# Patient Record
Sex: Male | Born: 1950
Health system: Southern US, Community
[De-identification: ages and names within clinical notes are randomized; demographics above are authoritative.]

## PROBLEM LIST (undated history)

## (undated) DIAGNOSIS — N189 Chronic kidney disease, unspecified: Secondary | ICD-10-CM

## (undated) DIAGNOSIS — M069 Rheumatoid arthritis, unspecified: Secondary | ICD-10-CM

## (undated) DIAGNOSIS — G4730 Sleep apnea, unspecified: Secondary | ICD-10-CM

## (undated) DIAGNOSIS — J45909 Unspecified asthma, uncomplicated: Secondary | ICD-10-CM

## (undated) DIAGNOSIS — E669 Obesity, unspecified: Secondary | ICD-10-CM

## (undated) DIAGNOSIS — E039 Hypothyroidism, unspecified: Secondary | ICD-10-CM

## (undated) DIAGNOSIS — I10 Essential (primary) hypertension: Secondary | ICD-10-CM

## (undated) DIAGNOSIS — D352 Benign neoplasm of pituitary gland: Secondary | ICD-10-CM

## (undated) DIAGNOSIS — E119 Type 2 diabetes mellitus without complications: Secondary | ICD-10-CM

## (undated) DIAGNOSIS — E785 Hyperlipidemia, unspecified: Secondary | ICD-10-CM

## (undated) HISTORY — PX: TUMOR REMOVAL: SHX12

## (undated) HISTORY — DX: Essential (primary) hypertension: I10

## (undated) HISTORY — DX: Hyperlipidemia, unspecified: E78.5

## (undated) HISTORY — DX: Sleep apnea, unspecified: G47.30

## (undated) HISTORY — DX: Type 2 diabetes mellitus without complications: E11.9

## (undated) HISTORY — PX: HAND SURGERY: SHX662

## (undated) HISTORY — PX: THYROIDECTOMY: SHX17

## (undated) HISTORY — DX: Unspecified asthma, uncomplicated: J45.909

## (undated) HISTORY — PX: TONSILLECTOMY: SUR1361

## (undated) HISTORY — DX: Chronic kidney disease, unspecified: N18.9

## (undated) HISTORY — DX: Obesity, unspecified: E66.9

## (undated) HISTORY — DX: Benign neoplasm of pituitary gland: D35.2

## (undated) HISTORY — DX: Rheumatoid arthritis, unspecified: M06.9

---

## 1996-12-03 HISTORY — PX: OTHER SURGICAL HISTORY: SHX169

## 1998-08-19 ENCOUNTER — Ambulatory Visit (HOSPITAL_COMMUNITY): Admission: RE | Admit: 1998-08-19 | Discharge: 1998-08-19 | Payer: Self-pay | Admitting: *Deleted

## 1999-06-29 ENCOUNTER — Encounter (INDEPENDENT_AMBULATORY_CARE_PROVIDER_SITE_OTHER): Payer: Self-pay | Admitting: Specialist

## 1999-06-29 ENCOUNTER — Ambulatory Visit (HOSPITAL_COMMUNITY): Admission: RE | Admit: 1999-06-29 | Discharge: 1999-06-29 | Payer: Self-pay

## 1999-09-27 ENCOUNTER — Ambulatory Visit (HOSPITAL_COMMUNITY): Admission: RE | Admit: 1999-09-27 | Discharge: 1999-09-27 | Payer: Self-pay

## 1999-10-03 ENCOUNTER — Emergency Department (HOSPITAL_COMMUNITY): Admission: EM | Admit: 1999-10-03 | Discharge: 1999-10-03 | Payer: Self-pay | Admitting: *Deleted

## 1999-11-20 ENCOUNTER — Encounter (INDEPENDENT_AMBULATORY_CARE_PROVIDER_SITE_OTHER): Payer: Self-pay | Admitting: *Deleted

## 1999-11-20 ENCOUNTER — Ambulatory Visit (HOSPITAL_COMMUNITY): Admission: RE | Admit: 1999-11-20 | Discharge: 1999-11-21 | Payer: Self-pay

## 2000-01-22 ENCOUNTER — Ambulatory Visit (HOSPITAL_COMMUNITY): Admission: RE | Admit: 2000-01-22 | Discharge: 2000-01-22 | Payer: Self-pay | Admitting: Endocrinology

## 2000-01-26 ENCOUNTER — Encounter: Payer: Self-pay | Admitting: Endocrinology

## 2001-01-09 ENCOUNTER — Ambulatory Visit (HOSPITAL_COMMUNITY): Admission: RE | Admit: 2001-01-09 | Discharge: 2001-01-09 | Payer: Self-pay | Admitting: Endocrinology

## 2001-01-09 ENCOUNTER — Encounter: Payer: Self-pay | Admitting: Endocrinology

## 2001-01-27 ENCOUNTER — Ambulatory Visit (HOSPITAL_COMMUNITY): Admission: RE | Admit: 2001-01-27 | Discharge: 2001-01-27 | Payer: Self-pay | Admitting: Endocrinology

## 2001-01-28 ENCOUNTER — Encounter: Payer: Self-pay | Admitting: Endocrinology

## 2001-03-10 ENCOUNTER — Ambulatory Visit (HOSPITAL_COMMUNITY): Admission: RE | Admit: 2001-03-10 | Discharge: 2001-03-10 | Payer: Self-pay | Admitting: Endocrinology

## 2001-03-10 ENCOUNTER — Encounter: Payer: Self-pay | Admitting: Endocrinology

## 2001-12-22 ENCOUNTER — Encounter: Payer: Self-pay | Admitting: Endocrinology

## 2001-12-22 ENCOUNTER — Ambulatory Visit (HOSPITAL_COMMUNITY): Admission: RE | Admit: 2001-12-22 | Discharge: 2001-12-22 | Payer: Self-pay | Admitting: Endocrinology

## 2001-12-26 ENCOUNTER — Ambulatory Visit (HOSPITAL_COMMUNITY): Admission: RE | Admit: 2001-12-26 | Discharge: 2001-12-26 | Payer: Self-pay | Admitting: Endocrinology

## 2003-02-15 ENCOUNTER — Ambulatory Visit (HOSPITAL_COMMUNITY): Admission: RE | Admit: 2003-02-15 | Discharge: 2003-02-15 | Payer: Self-pay | Admitting: Endocrinology

## 2003-02-15 ENCOUNTER — Encounter: Payer: Self-pay | Admitting: Endocrinology

## 2003-03-11 ENCOUNTER — Emergency Department (HOSPITAL_COMMUNITY): Admission: EM | Admit: 2003-03-11 | Discharge: 2003-03-11 | Payer: Self-pay | Admitting: Emergency Medicine

## 2003-03-11 ENCOUNTER — Encounter: Payer: Self-pay | Admitting: Emergency Medicine

## 2003-12-31 ENCOUNTER — Ambulatory Visit (HOSPITAL_COMMUNITY): Admission: RE | Admit: 2003-12-31 | Discharge: 2003-12-31 | Payer: Self-pay | Admitting: *Deleted

## 2004-08-21 ENCOUNTER — Encounter (HOSPITAL_COMMUNITY): Admission: RE | Admit: 2004-08-21 | Discharge: 2004-11-19 | Payer: Self-pay | Admitting: Family Medicine

## 2006-02-12 ENCOUNTER — Emergency Department (HOSPITAL_COMMUNITY): Admission: EM | Admit: 2006-02-12 | Discharge: 2006-02-12 | Payer: Self-pay | Admitting: *Deleted

## 2006-02-18 ENCOUNTER — Ambulatory Visit: Payer: Self-pay

## 2006-03-05 ENCOUNTER — Ambulatory Visit: Payer: Self-pay | Admitting: Internal Medicine

## 2006-03-11 ENCOUNTER — Ambulatory Visit: Payer: Self-pay | Admitting: Internal Medicine

## 2006-03-17 ENCOUNTER — Ambulatory Visit (HOSPITAL_BASED_OUTPATIENT_CLINIC_OR_DEPARTMENT_OTHER): Admission: RE | Admit: 2006-03-17 | Discharge: 2006-03-17 | Payer: Self-pay | Admitting: Internal Medicine

## 2006-03-26 ENCOUNTER — Encounter: Payer: Self-pay | Admitting: Cardiology

## 2006-03-26 ENCOUNTER — Ambulatory Visit: Payer: Self-pay

## 2006-03-28 ENCOUNTER — Ambulatory Visit: Payer: Self-pay | Admitting: Pulmonary Disease

## 2006-04-16 ENCOUNTER — Ambulatory Visit: Payer: Self-pay | Admitting: Internal Medicine

## 2006-05-13 ENCOUNTER — Ambulatory Visit: Payer: Self-pay | Admitting: Pulmonary Disease

## 2006-06-24 ENCOUNTER — Ambulatory Visit: Payer: Self-pay | Admitting: Pulmonary Disease

## 2007-08-20 ENCOUNTER — Ambulatory Visit: Payer: Self-pay | Admitting: Internal Medicine

## 2007-09-10 ENCOUNTER — Encounter: Admission: RE | Admit: 2007-09-10 | Discharge: 2007-09-10 | Payer: Self-pay

## 2007-09-23 ENCOUNTER — Ambulatory Visit: Payer: Self-pay | Admitting: Internal Medicine

## 2007-10-21 ENCOUNTER — Ambulatory Visit: Payer: Self-pay | Admitting: Pulmonary Disease

## 2008-04-25 ENCOUNTER — Ambulatory Visit: Payer: Self-pay | Admitting: Cardiology

## 2008-04-25 ENCOUNTER — Inpatient Hospital Stay (HOSPITAL_COMMUNITY): Admission: EM | Admit: 2008-04-25 | Discharge: 2008-04-27 | Payer: Self-pay | Admitting: Emergency Medicine

## 2008-04-27 ENCOUNTER — Encounter (INDEPENDENT_AMBULATORY_CARE_PROVIDER_SITE_OTHER): Payer: Self-pay | Admitting: Neurology

## 2008-11-24 ENCOUNTER — Encounter: Payer: Self-pay | Admitting: Internal Medicine

## 2008-11-24 DIAGNOSIS — E663 Overweight: Secondary | ICD-10-CM | POA: Insufficient documentation

## 2008-11-24 DIAGNOSIS — R002 Palpitations: Secondary | ICD-10-CM | POA: Insufficient documentation

## 2008-12-21 ENCOUNTER — Encounter: Admission: RE | Admit: 2008-12-21 | Discharge: 2008-12-21 | Payer: Self-pay | Admitting: Internal Medicine

## 2009-12-03 HISTORY — PX: OTHER SURGICAL HISTORY: SHX169

## 2009-12-15 ENCOUNTER — Encounter: Admission: RE | Admit: 2009-12-15 | Discharge: 2009-12-15 | Payer: Self-pay | Admitting: Endocrinology

## 2010-01-02 ENCOUNTER — Encounter: Payer: Self-pay | Admitting: Internal Medicine

## 2010-06-02 ENCOUNTER — Encounter: Admission: RE | Admit: 2010-06-02 | Discharge: 2010-06-02 | Payer: Self-pay | Admitting: Internal Medicine

## 2010-07-17 ENCOUNTER — Encounter: Payer: Self-pay | Admitting: Internal Medicine

## 2010-12-24 ENCOUNTER — Encounter: Payer: Self-pay | Admitting: Endocrinology

## 2010-12-24 ENCOUNTER — Encounter: Payer: Self-pay | Admitting: Family Medicine

## 2011-01-04 NOTE — Letter (Signed)
Summary: [facility] Medical Neurosurgery Office Note  [facility] Medical Neurosurgery Office Note   Imported By: Sallee Provencal 03/01/2010 16:23:54  _____________________________________________________________________  External Attachment:    Type:   Image     Comment:   External Document

## 2011-01-04 NOTE — Letter (Signed)
Summary: [facility] Medical Office Visit Note   [facility] Medical Office Visit Note   Imported By: Sallee Provencal 09/15/2010 12:41:57  _____________________________________________________________________  External Attachment:    Type:   Image     Comment:   External Document

## 2011-04-17 NOTE — H&P (Signed)
[name], [name]                ACCOUNT NO.:  [id number]   MEDICAL RECORD NO.:  [id number]          PATIENT TYPE:  INP   LOCATION:  3729                         FACILITY:  [facility]   PHYSICIAN:  Jana Hakim, M.D. DATE OF BIRTH:  [birth year]   DATE OF ADMISSION:  04/25/2008  DATE OF DISCHARGE:                              HISTORY & PHYSICAL   DATE OF ADMISSION:  Apr 25, 2008   CHIEF COMPLAINT:  Left arm numbness.   HISTORY OF PRESENT ILLNESS:  This is a 60 year old male presenting to  the emergency department with complaints of increased numbness in the  left arm that started about 9 a.m. and lasted 1-1/2 hours at first.  He  reports this pain or discomfort returned again 1 hour later.  He also  reports having numbness in both sides of his face and had a left-sided  headache.  The patient denies having any chest pain, shortness of  breath, nausea, vomiting, diaphoresis associated with the discomfort.  He denies having any syncope or seizure activity.  The patient reports  trying to shake his arm because he felt this might have been due to  circulation.  Of note, the patient also reports having a previous  history of a pinched nerve in his neck.   PAST MEDICAL HISTORY:  Significant for rheumatoid arthritis, asthma,  hypertension, hypothyroidism secondary to a history of thyroid cancer  and status post thyroidectomy, obstructive sleep apnea and obesity.   PAST SURGICAL HISTORY:  Significant for a right knee tendon repair, a  thyroidectomy, and bilateral carpal tunnel release surgeries, as well as  a tonsillectomy.   MEDICATIONS:  Include:  Benicar/HCT 40/25, 1 p.o. daily.  Humira 40 mg.  ProAir inhaler q.i.d. p.r.n.  Methotrexate 2.5 mg. 4 tablets p.o. daily on Mondays.  Aspirin 81 mg one p.o. daily.  Synthroid 200 mcg, 1 p.o. daily.   ALLERGIES:  NO KNOWN DRUG ALLERGIES.   SOCIAL HISTORY:  The patient is a smoker.  He smokes 1-1/2 packs of  cigarettes per day and has  done so since age 6, 63 years.  He also  drinks alcohol and reports drinking 2 mixed drinks a week.   FAMILY HISTORY:  Positive for coronary artery disease and prostate  cancer in his father and positive strong family history, both sides of  hypertension and no history of diabetes that he knows of.   PHYSICAL EXAMINATION:  This is a pleasant 60 year old in no discomfort  or acute distress currently.  VITAL SIGNS:  Temperature 97.9, blood pressure 151/85, heart rate 76,  respirations 16 and O2 saturations 97%.  HEENT:  Normocephalic, atraumatic.  Pupils equally round, reactive to  light.  Extraocular muscles are intact.  Funduscopic benign. Oropharynx  is clear.  NECK:  Supple, full range of motion.  No thyromegaly, adenopathy,  jugular venous distention.  CARDIOVASCULAR:  Regular rate and rhythm.  No murmurs, gallops or rubs.  LUNGS:  Clear to auscultation bilaterally.  ABDOMEN:  Positive bowel sounds, soft, nontender, nondistended.  EXTREMITIES:  Without cyanosis, clubbing or edema.  NEUROLOGIC:  There are no focal deficits.  The patient has full range of  motion of all of his extremities.  His sensation is intact.  His gait is  also steady.  His reflexes are also intact.   LABORATORY STUDIES:  White blood cell count 9.7, hemoglobin 15.4,  hematocrit 44.4, platelets 298, neutrophils 58%, lymphocytes 26%.  Sodium 139, potassium 4.8, chloride 102, bicarb 27, BUN 14, creatinine  1.04, glucose 92.  Pro time 12.8, INR 0.9, PTT 32.  CT scan of the head  negative for any acute findings.   ASSESSMENT:  60 year old male being admitted with:   1. Left arm numbness.  2. Transient ischemic attack versus cerebrovascular accident.  3. Hypertension.  4. Rheumatoid arthritis.  5. Obstructive sleep apnea  6. Hypothyroid history.   PLAN:  The patient will be admitted to telemetry for cardiac monitoring.  Cardiac enzymes will be performed.  An MRI/MRA study has been attempted  before  admission.  However, due to the patient's size, the study was  unable to be completed at this facility.  An MRI/MRA will be ordered to  be performed at an accommodating facility.  A carotid ultrasound has  also been ordered as well.  Neurologic checks have been ordered and the  patient will continue on his regular medications.  The patient does not  have deficits at this time.  His swallowing, speaking and his motor and  sensory functions are all intact.  DVT and GI prophylaxis have also been  ordered and the patient's thyroid hormone will also be checked.      Jana Hakim, M.D.  Electronically Signed     HJ/MEDQ  D:  04/26/2008  T:  04/26/2008  Job:  [id number]   cc:   Arlyss Repress, MD

## 2011-04-17 NOTE — Discharge Summary (Signed)
[name], [name]                ACCOUNT NO.:  [id number]   MEDICAL RECORD NO.:  [id number]          PATIENT TYPE:  INP   LOCATION:  [id number]                         FACILITY:  [facility]   PHYSICIAN:  Vernell Leep, MD     DATE OF BIRTH:  1951/11/10   DATE OF ADMISSION:  04/25/2008  DATE OF DISCHARGE:  04/27/2008                               DISCHARGE SUMMARY   FAMILY MEDICAL DOCTOR:  Arlyss Repress, M.D.   CARDIOLOGIST:  Shaune Pascal. Bensimhon, M.D.   DISCHARGE DIAGNOSES:  1. Transient ischemic attack versus cerebrovascular accident.  2. Tobacco abuse.  3. Morbid obesity.  4. Hypertension.  5. Hypothyroidism status post thyroidectomy for thyroid cancer.  6. Rheumatoid arthritis.  7. Obstructive sleep apnea, on continuous positive airway pressure      q.h.s.   DISCHARGE MEDICATIONS:  1. Benicar/hydrochlorothiazide, 40/25, 1 p.o. daily.  2. Synthroid 225 mcg p.o. daily.  3. Methotrexate 10 mg p.o. weekly.  4. Humira 40 mg q. 2 weeks.  5. ProAir inhaler 1 puff 4 times daily p.r.n.  6. Plavix 75 mg p.o. daily.   MEDICATIONS DISCONTINUED:  Aspirin.   PERTINENT LABORATORIES:  1. CT angiogram of the neck.  Impression:      a.     Bilateral carotid bifurcation in left vertebral artery       origin, atherosclerosis with no hemodynamically significant       stenosis.      b.     No acute findings in the neck.  Query prior thyroidectomy.  2. CT angiogram of the head.  Impression:      a.     No intracranial arterial occlusion or aneurysm identified.      b.     Bilateral cavernous carotid atherosclerosis, left greater       than right.  There is 50% to 55% stenosis of the left cavernous       ICA with respect to the distal vessel.      c.     Incidental anatomic variants, including dominant left       vertebral artery, fetal origin of the right PCA and median artery       of the corpus callosum.      d.     No acute intracranial abnormality.  Nonspecific frontal lobe       subcortical  white matter hypodensity is most commonly due to       chronic small vessel ischemic disease.      e.     Ethmoid and left frontal sinus inflammatory changes.  3. X-ray of the orbits for foreign body.  Impression:  No foreign body      identified.  4. CT of the head without contrast.  Impression:      a.     No acute intracranial abnormality.      b.     Carotid atherosclerosis.      c.     Mild chronic microvascular ischemic changes.  5. A 2-D echocardiogram.  Report is pending.   TSH 0.068.  Basic metabolic panel unremarkable  with BUN 13, creatinine  1.01.  Glucose 127.  CBC is with hemoglobin 15.2, hematocrit 43.4, white  blood cell 8.3, platelets 242.  Cardiac enzymes negative x1.  INR 0.9.   CONSULTATIONS:  Neurology, [name], M.D.   [place]:  1. Please refer to the history and physical note for initial admission      details.  In summary, [name] is a pleasant 60 year old male      patient with history of rheumatoid arthritis, asthma, hypertension,      hypothyroidism, status post thyroidectomy for thyroid cancer,      obstructive sleep apnea, on nightly CPAP, who presented with a      history of numbness of the lower half of his face on both sides and      left upper extremity.  Initially the symptoms came on and resolved      after an hour and a half, but subsequently during the second      episode the symptoms persisted.  He denied any history of headache,      visual symptoms, change in his speech, drooling or twisting of his      mouth or limb weakness.  He did not have any loss of consciousness.      He was evaluated in the emergency room and was admitted to rule out      TIA versus CVA.  He was beyond the window for any t-PA      intervention.  He was admitted to telemetry bed with no significant      arrhythmia.  He has had episodes of sinus bradycardia in high 30s      to 50s but these were asymptomatic.  His cardiac enzymes  were      negative.  He had imaging studies as indicated above.      Unfortunately, he could not do a closed MRI at the hospital because      he would not fit in the machine.  Dr. Jannifer Franklin has reviewed him today      and I have discussed the patient's case with Dr. Jannifer Franklin, who      indicates that based on the CT angiogram of the head and the neck      there is no lesion that would require surgery or intervention.      From his standpoint, the patient could be discharged home on      Plavix.  He is to get an open MRI at [facility] as an outpatient      to rule out a CVA and then follow up with Dr. Jannifer Franklin in the next      week or two.  I have called Dr. Maudie Mercury, and Dr. Julianne Rice staff will make      arrangements for the open MRI in the next day or two and will      follow up.  2. Tobacco use.  The patient is a heavy smoker and has been counseled      regarding cessation.  He indicates that he does not feel that he      wants to quit, but he will have to.  He declined the offer for a      nicotine patch.  3. Morbid obesity.  4. Hypertension.  Continue with home medications.  5. Hypothyroidism status post thyroidectomy.  Continue thyroid      supplements.  6. Rheumatoid arthritis.  Continue home medications.  7. Obstructive sleep  apnea, which is stable on nightly CPAP.   The patient will be discharged home pending echo results.      Vernell Leep, MD  Electronically Signed     AH/MEDQ  D:  04/27/2008  T:  04/27/2008  Job:  [id number]   cc:   Arlyss Repress, MD  C. Floyde Parkins, M.D.  Shaune Pascal. Bensimhon, MD

## 2011-04-17 NOTE — Consult Note (Signed)
NAME:  [name]                ACCOUNT NO.:  [id number]   MEDICAL RECORD NO.:  [id number]          PATIENT TYPE:  INP   LOCATION:  [id number]                         FACILITY:  [facility]   PHYSICIAN:  Jill Alexanders, M.D.  DATE OF BIRTH:  February 17, 1951   DATE OF CONSULTATION:  04/26/2008  DATE OF DISCHARGE:                                 CONSULTATION   HISTORY OF PRESENT ILLNESS:  [name] is a 60 year old right-handed  white male, born 03/06/1951, with a history of obesity, hypertension,  obstructive sleep apnea, and rheumatoid arthritis.  The patient comes in  to [facility] for evaluation of transient episodes of left  upper extremity numbness that began around 9 a.m. on Apr 25, 2008.  The  patient had transient episode initially lasting only a few minutes.  Initially, he did not think anything of the numbness, but it recurred  later, again lasting a few minutes.  Several hours later, the patient  had another recurrence of the left upper extremity numbness, this time  occluding the left face and then going to the right face as well.  The  patient believed there has been some clumsiness of the left arm that was  transient in nature.  The patient, however, still feels some numb  tingling sensations on the face on both sides, but the left arm now  feels normal.  No weakness or clumsiness noted.  The patient never  recalled having trouble with the legs or with walking balance issues.  The patient did note slight left-sided headache yesterday and noted no  problems of speech or vision changes.  The patient has come into the  hospital for evaluation and a CT scan of the head by my review appears  to be unremarkable.  The patient was set up for an MRI scan of the brain  which could not be done due to the patient's size.  Neurology was asked  to see this patient for further evaluation.  The patient was on aspirin  prior to this admission.   Past medical history is significant  for;  1. History of episode of bilateral face left arm numbness.  2. Obesity.  3. Hypertension.  4. Thyroid cancer status post thyroidectomy and subsequent      hypothyroidism.  5. Arthroscopic knee surgery on the right.  6. Rheumatoid arthritis.  7. Bilateral carpal tunnel syndrome with surgery bilaterally.  8. Obstructive sleep apnea on CPAP.  9. Tonsillectomy.   The patient has no known allergies.   Smokes a pack and a half of cigarettes daily.  Drinks 2-3 alcoholic  beverages a week.   SOCIAL HISTORY:  The patient [place] in the [place] area, is retired  from [employer], has two children, both daughters, one daughter has  thyroid problems.   FAMILY MEDICAL HISTORY:  His mother is alive with hypertension.  Father  died with emphysema.  The patient has one brother and two sisters who  live in [place] and as far as he knows are alive and well.  One brother  died at a young age with some  sort of the stomach cancer.   Current medications at this time include;  1. Lovenox 40 mg daily.  2. Hydrochlorothiazide 25 mg daily.  3. Avapro 300 mg daily.  4. Synthroid 0.2 mg daily.  5. Methotrexate 10 mg once a week.  6. Protonix 40 mg daily.  7. Tylenol if needed.  8. Ventolin inhaler two puffs four times a day if needed.  9. Dilaudid if needed.  10.Ambien 5 mg if needed at night.   REVIEW OF SYSTEMS:  Notable for no recent fevers or chills.  The patient  noted some left-sided headache, does note some neck pain at times.  Has  history of shortness of breath, asthma, occasional palpitations of the  heart.  Denies any abdominal pain, troubles controlling bowels or  bladder, but has had some problems with diarrhea over the last week.  The patient denied any blackout spells, dizziness, or seizure spells.   PHYSICAL EXAMINATION:  VITALS:  Blood pressure is 156/89, heart rate 58,  respiratory rate 18, temperature afebrile.  GENERAL:  The patient is a markedly obese white male who is  alert,  cooperative at the time of examination.  HEENT:  Head is atraumatic.  EYES:  Pupils are equal, round, and  reactive to light.  Disks are flat bilaterally.  NECK:  Supple.  No carotid bruits noted.  RESPIRATORY:  Clear.  CARDIOVASCULAR:  A regular rate and rhythm.  No obvious murmurs or rubs  noted.  EXTREMITIES:  Notable for trace edema at the ankles.  NEUROLOGIC:  CRANIAL NERVES:  As above.  Facial symmetry is present.  The patient has good sensation around the face to pinprick, soft touch  bilaterally.  He has good strength of the facial muscles, motion of the  head turn, and shoulder shrug bilaterally.  Symmetric smile noted.  Extraocular movements are full.  Visual fields are full.  Speech is well  enunciated, not aphasic.  Motor testing was 5/5 strength in all fours.  Good symmetric motor tone is noted throughout.  Sensory testing is  notable for normal pinprick, soft touch, vibratory sensation in all  fours.  The patient has good finger-nose-finger and heel-to-shin.  Gait  was not tested.  Deep tendon reflexes symmetric and normal.  Toes are  neutral bilaterally.  No drift is seen on the arms or legs.  No  extinction is noted on double simultaneous stimulation on the arms or  legs.  The patient has what may be a very mild left carotid bruit not  present on the right.   IMPRESSION:  1. Transient left arm numbness.  NIH stroke scale score of 0.  The      patient reports ongoing bilateral face tingling.  2. Hypertension.   This patient does have risk factors for stroke, has NIH stroke scale  score of 0 at this point.  The patient was on aspirin prior to coming  in, is not a t-PA candidate due to minimal deficit and due to duration  of symptoms.  The patient has bilateral facial tingling, but claims he  has had this off and on in the past.  The patient cannot sit in the  [facility] MRI scanner and may need an MRI try at the imaging at some  point.  We will pursue further  workup otherwise during this  hospitalization.   PLAN:  1. We will obtain a CT angiogram of intracranial and extracranial      vessels.  2. 2-D echocardiogram.  3.  MRI of the brain when available.  We will change the patient to      Plavix at this point and take him off of aspirin.  We will follow      the patient's clinical course while in-house      C. Floyde Parkins, M.D.  Electronically Signed     CKW/MEDQ  D:  04/26/2008  T:  04/26/2008  Job:  [id number]

## 2011-04-17 NOTE — Assessment & Plan Note (Signed)
[facility] OFFICE NOTE   NAME:Dwayne, RHILEY Nguyen                       MRN:          [id number]  DATE:09/23/2007                            DOB:          June 07, 1951    INTERVAL HISTORY:  [name] is a very pleasant 60 year old male with a  history of morbid obesity, sleep apnea, hypertension, and ongoing  tobacco use who we follow for palpitations.  He previously had an  episode of significant palpitations and chest pain during epinephrine  injection with a dental procedure.  He had a Myoview which was normal  showing an ejection fraction of 55% with a mildly dilated ventricle and  no ischemia.   He was seen last month by Ermalinda Barrios as he was having increased  palpitations.  She thought this was likely due to his heavy caffeine  intake.  He has since cut down his caffeine and feels much better with  just very occasional palpitations, no syncope or presyncope.  Unfortunately, he continues to smoke.  He does feel somewhat short of  breath with exertion but no chest pain.  He feels like he is getting  chronic obstructive pulmonary disease.  He says he is under a lot of  stress.   CURRENT MEDICATIONS:  1. Synthroid 225 mcg a day.  2. Methotrexate for rheumatoid arthritis 10 mg once a week.  3. Diovan hydrochlorothiazide 160/25.  4. Humira 40 mg every 2 weeks.  5. Aspirin 81 a day.  6. Advair.   PHYSICAL EXAMINATION:  He is an obese male in no acute distress.  Ambulates around the clinic without any respiratory difficulty.  Blood pressure is 133/76, heart rate is 80, weight is 292.  HEENT:  Normal.  NECK:  Thick and supple.  It is difficult to evaluate the JVD, but it  appears low.  Carotids are 2+ bilaterally without bruits.  There is no  lymphadenopathy or thyromegaly.  CARDIAC:  PMI is nonpalpable.  He is regular with an S4, no murmur.  LUNGS:  Clear with decreased air movement throughout.  ABDOMEN:   Morbidly obese.  Good bowel sounds.  Soft, nondistended.  Unable to appreciate any hepatosplenomegaly, no bruits or masses.  EXTREMITIES:  Warm, no cyanosis, clubbing, or edema.  No rash.  NEURO:  Alert and oriented x3.  Cranial nerves II-XII are intact.  Moves  all 4 extremities without difficulty.   ASSESSMENT:  1. Palpitations:  These are chronic for him.  They are improved with      decreased caffeine intake.  We discussed the possibility of going      on a low-dose beta blocker, but he is not interested in this at      this time.  2. Tobacco use:  We had a long talk about the need to quit smoking and      the possibility of developing lung disease and other complications.      We are going to get a pulmonary function tests with a DLCO to see      if we can motivate him more  to stop smoking.  3. Morbid obesity:  Once again, I had a discussion with him and his      wife about the need to be more active and watch his diet to lose      weight.  4. Sleep apnea:  Continue continuous positive airway pressure.  5. Hyperlipidemia:  Per his primary care physician.   DISPOSITION:  We will see him back for routine followup in several  months.     Shaune Pascal. Bensimhon, MD  Electronically Signed    DRB/MedQ  DD: 09/23/2007  DT: 09/24/2007  Job #: [id number]

## 2011-04-17 NOTE — Assessment & Plan Note (Signed)
[facility] OFFICE NOTE   NAME:Salaz, ANTARIO DELFINO                       MRN:          [id number]  DATE:08/20/2007                            DOB:          [birth year]    This is a 60 year old married white male patient of Dr. Haroldine Laws who  was seen a year ago for chest pain in the setting of epinephrine  injection for dental procedure.  Adenosine Myoview showed no ischemia,  and a mildly dilated ventricle, EF of 55%.  Echo showed EF of 55% to 60%  with mild LVH and trivial aortic valvular regurgitation.  The patient  presents today because of palpitations.  He says he is under a lot of  stress at home, and is a chronic worrier, and over the past month  complains of his heart skipping a beat, and sometimes quivering.  He has  occasional lightheadedness, but no overt dizziness or presyncope.  He  has no associated chest pain, shortness of breath.  He says these  palpitations sometimes last all day long.  On further questioning, the  patient admits to drinking at 5 to 6 cups of coffee in the morning, but  sometimes 10 cups followed by 2 to 5 cans of Pepsi in the afternoon, as  well as caffeinated tea.  He was walking 2 miles a day, but stopped that  because he was worried the palpitations would worsen, although they did  not change when he was walking.  Blood pressure is elevated in the  office today, but he forgot to take his medications before he came.   CURRENT MEDICATIONS:  1. Synthroid 225 mcg daily.  2. Methotrexate 2.5 mg once a week.  3. Diovan/hydrochlorothiazide 160/25 daily.  4. Humira 40 mg every 2 weeks.  5. Aspirin 81 mg a day.  6. Advair 1 puff p.r.n.   PHYSICAL EXAMINATION:  This is an obese 60 year old white male in no  acute distress.  Blood pressure 164/92.  Pulse 77.  Weight 292.  NECK:  Without JVD, HJR, bruit, or thyroid enlargement.  LUNGS:  Clear anterior, posterior, and lateral.  HEART:   Regular rate and rhythm at 70 beats per minute.  Normal S1 and  S2.  No murmur, rub, bruit, thrill, or heave noted.  ABDOMEN:  Obese.  Normoactive bowel sounds are heard throughout.  EXTREMITIES:  Without cyanosis, clubbing, or edema.  Good distal pulses.  EKG:  Normal sinus rhythm.  We do not have an EKG in the chart to  compare it to.   IMPRESSION:  1. Palpitations felt secondary to excessive caffeine and cigarette      intake.  2. History of chest pain in the setting of epinephrine injection for a      dental procedure in 2007.  Adenosine Myoview showed no ischemia      with mildly dilated ventricle.  Ejection fraction of 51%.  Echo      showed ejection fraction of 55% to 60% with mild left ventricular      hypertrophy, trivial aortic insufficiency.  3. Morbid obesity.  4. Chronic obstructive pulmonary disease with ongoing tobacco use of 1-      1/2 packs per day.  5. Metabolic syndrome.  6. Sleep apnea.  7. Hypertension.  8. Rheumatoid arthritis.  9. History of papillary carcinoma of the thyroid status post      thyroidectomy.  10.Chronic right bundle branch block.  11.History of right carotid bruit with no significant stenosis on      ultrasound in April 2007.  12.Family history of coronary artery disease.  Father had a myocardial      infarction at age 48.   PLAN:  I have asked the patient to dramatically decrease his caffeine  intake as well as his cigarettes.  I think his palpitations will  resolve, but if not, I have asked him to call if they continue, and we  can put an event recorder on him.  Dr. Haroldine Laws can see him back in 1  month to see if there is any improvement, and may want to do an echo at  that time to follow up his LV.  I told the patient he really needs to  lose weight and get on an exercise program once he takes care of these  other issues.      Ermalinda Barrios, PA-C  Electronically Signed      Champ Mungo. Lovena Le, MD  Electronically Signed    ML/MedQ  DD: 08/20/2007  DT: 08/20/2007  Job #: [id number]

## 2011-04-20 NOTE — Procedures (Signed)
NAMEZAHARI, [name]                ACCOUNT NO.:  [id number]   MEDICAL RECORD NO.:  [id number]          PATIENT TYPE:  OUT   LOCATION:  SLEEP CENTER                 FACILITY:  [facility]   PHYSICIAN:  Danton Sewer, M.D. [facility] DATE OF BIRTH:  Oct 16, 1951   DATE OF STUDY:  03/17/2006                              NOCTURNAL POLYSOMNOGRAM   INDICATION FOR STUDY:  Hypersomnia with sleep apnea.   EPWORTH SLEEPINESS SCORE:  11   SLEEP ARCHITECTURE:  The patient had a total sleep time of 357 minutes with  significantly decreased slow weight sleep as well as REM.  Sleep onset  latency was normal and REM onset was prolonged.  Sleep efficiency was 87%.   RESPIRATORY DATA:  The patient underwent split-night study where he was  found to have 137 obstructive events in the first 126 minutes of sleep.  This gave the patient a respiratory disturbance index of 66 events per hour  and loud snoring was noted throughout.  The events were not positional in  nature.  By protocol.  The patient was then placed on a Respironics nasal  pillow set up and ultimately titrated to a final pressure of 13 cm with good  control of his obstructive events and snoring.   OXYGEN DATA:  O2 desaturation as low as 86% with the patient's obstructive  events.   CARDIAC DATA:  No clinically significant cardiac arrhythmias.   MOVEMENT-PARASOMNIA:  The patient was found to have small numbers of leg  jerks with no significant sleep disruption.   IMPRESSIONS-RECOMMENDATIONS:  Severe obstructive sleep apnea noted by split-  night study with respiratory disturbance index of 66 events per hour.  There  was oxygen desaturation as low as 86% and loud snoring noted.  The patient  was placed on a Respironics nasal pillow and ultimately titrated to a final  pressure of 13 cm.           ______________________________  Danton Sewer, M.D. [facility]  Diplomate, American Board of Sleep  Medicine     KC/MEDQ  D:  03/28/2006 14:38:05  T:   03/29/2006 09:53:37  Job:  [id number]

## 2011-04-20 NOTE — Op Note (Signed)
NAME:  [name], [name]                          ACCOUNT NO.:  [id number]   MEDICAL RECORD NO.:  [id number]                   PATIENT TYPE:  AMB   LOCATION:  ENDO                                 [facility]ACILITY:  [facility]   PHYSICIAN:  Waverly Ferrari, M.D.                 DATE OF BIRTH:  1951-04-23   DATE OF PROCEDURE:  DATE OF DISCHARGE:                                 OPERATIVE REPORT   PROCEDURE:  Colonoscopy.   INDICATIONS FOR PROCEDURE:  Rectal bleeding, colon cancer screening.   ANESTHESIA:  Demerol 80, Versed 7 mg.   PREPARATION:  The patient drank 2 liters of Gatorade with one bottle of  MiraLax mixed in and results were excellent.   DESCRIPTION OF PROCEDURE:  With the patient mildly sedated in the left  lateral decubitus position, the Olympus videoscopic colonoscope was inserted  into the rectum after a normal rectal examination was performed, passed  under direct vision to the cecum identified by the ileocecal valve and  appendiceal orifice the latter of which was photographed. From this point,  the colonoscope was slowly withdrawn taking circumferential views of the  colonic mucosa stopping only in the rectum which appeared normal on direct  and showed hemorrhoids on retroflexed view of the anal canal.  The endoscope  was straightened and withdrawn through the anal canal which otherwise  appeared normal.  The patient's vital signs and pulse oximeter remained  stable. The patient tolerated the procedure well without apparent  complications.   FINDINGS:  Internal hemorrhoids otherwise an unremarkable colonoscopic to  the cecum.   PLAN:  Consider repeat examination in 5-10 years.                                               Waverly Ferrari, M.D.    GMO/MEDQ  D:  12/31/2003  T:  12/31/2003  Job:  [id number]   cc:   Rachell Cipro, M.D.  [facility]  [place], [place] [address]  Fax: [phone]

## 2011-08-29 LAB — CBC
Platelets: 242
RBC: 4.67
RDW: 14.6
WBC: 8.3
WBC: 9.7

## 2011-08-29 LAB — DIFFERENTIAL
Basophils Relative: 0
Eosinophils Absolute: 0.5
Monocytes Absolute: 1
Monocytes Relative: 10

## 2011-08-29 LAB — BASIC METABOLIC PANEL
BUN: 14
Calcium: 9
Chloride: 102
Chloride: 103
Creatinine, Ser: 1.01
GFR calc Af Amer: >60
GFR calc non Af Amer: >60
Glucose, Bld: 127 — ABNORMAL HIGH
Glucose, Bld: 92
Sodium: 138

## 2011-08-29 LAB — TSH: TSH: 0.068 — ABNORMAL LOW

## 2011-08-29 LAB — APTT: aPTT: 32

## 2011-08-29 LAB — PROTIME-INR
INR: 0.9
Prothrombin Time: 12.8

## 2011-08-29 LAB — CK TOTAL AND CKMB (NOT AT ARMC)
CK, MB: 3
Relative Index: 2.6 — ABNORMAL HIGH
Total CK: 116

## 2011-08-29 LAB — TROPONIN I

## 2015-01-31 ENCOUNTER — Other Ambulatory Visit: Payer: Self-pay | Admitting: Endocrinology

## 2015-01-31 DIAGNOSIS — D443 Neoplasm of uncertain behavior of pituitary gland: Secondary | ICD-10-CM

## 2015-01-31 DIAGNOSIS — D444 Neoplasm of uncertain behavior of craniopharyngeal duct: Principal | ICD-10-CM

## 2015-02-14 ENCOUNTER — Ambulatory Visit
Admission: RE | Admit: 2015-02-14 | Discharge: 2015-02-14 | Disposition: A | Discharge status: Home / Self Care | Payer: PPO | Source: Ambulatory Visit | Attending: Endocrinology | Admitting: Endocrinology

## 2015-02-14 DIAGNOSIS — D444 Neoplasm of uncertain behavior of craniopharyngeal duct: Principal | ICD-10-CM

## 2015-02-14 DIAGNOSIS — D443 Neoplasm of uncertain behavior of pituitary gland: Secondary | ICD-10-CM

## 2015-02-14 MED ORDER — GADOBENATE DIMEGLUMINE 529 MG/ML IV SOLN
15.0000 mL | Freq: Once | INTRAVENOUS | Status: AC | PRN
  Administered 2015-02-14: 15 mL via INTRAVENOUS

## 2015-08-30 ENCOUNTER — Other Ambulatory Visit: Payer: Self-pay | Admitting: Internal Medicine

## 2015-08-30 DIAGNOSIS — R945 Abnormal results of liver function studies: Secondary | ICD-10-CM

## 2015-09-05 ENCOUNTER — Ambulatory Visit
Admission: RE | Admit: 2015-09-05 | Discharge: 2015-09-05 | Disposition: A | Discharge status: Home / Self Care | Payer: PPO | Source: Ambulatory Visit | Attending: Internal Medicine | Admitting: Internal Medicine

## 2015-09-05 DIAGNOSIS — R945 Abnormal results of liver function studies: Secondary | ICD-10-CM

## 2015-12-06 DIAGNOSIS — E039 Hypothyroidism, unspecified: Secondary | ICD-10-CM | POA: Diagnosis not present

## 2015-12-06 DIAGNOSIS — E7800 Pure hypercholesterolemia, unspecified: Secondary | ICD-10-CM | POA: Diagnosis not present

## 2015-12-06 DIAGNOSIS — I10 Essential (primary) hypertension: Secondary | ICD-10-CM | POA: Diagnosis not present

## 2015-12-06 DIAGNOSIS — E119 Type 2 diabetes mellitus without complications: Secondary | ICD-10-CM | POA: Diagnosis not present

## 2015-12-13 DIAGNOSIS — L02415 Cutaneous abscess of right lower limb: Secondary | ICD-10-CM | POA: Diagnosis not present

## 2016-01-13 DIAGNOSIS — G4733 Obstructive sleep apnea (adult) (pediatric): Secondary | ICD-10-CM | POA: Diagnosis not present

## 2016-01-25 DIAGNOSIS — E890 Postprocedural hypothyroidism: Secondary | ICD-10-CM | POA: Diagnosis not present

## 2016-01-25 DIAGNOSIS — I10 Essential (primary) hypertension: Secondary | ICD-10-CM | POA: Diagnosis not present

## 2016-01-25 DIAGNOSIS — D443 Neoplasm of uncertain behavior of pituitary gland: Secondary | ICD-10-CM | POA: Diagnosis not present

## 2016-01-25 DIAGNOSIS — C73 Malignant neoplasm of thyroid gland: Secondary | ICD-10-CM | POA: Diagnosis not present

## 2016-03-12 DIAGNOSIS — I10 Essential (primary) hypertension: Secondary | ICD-10-CM | POA: Diagnosis not present

## 2016-03-12 DIAGNOSIS — E039 Hypothyroidism, unspecified: Secondary | ICD-10-CM | POA: Diagnosis not present

## 2016-03-12 DIAGNOSIS — Z125 Encounter for screening for malignant neoplasm of prostate: Secondary | ICD-10-CM | POA: Diagnosis not present

## 2016-03-12 DIAGNOSIS — E119 Type 2 diabetes mellitus without complications: Secondary | ICD-10-CM | POA: Diagnosis not present

## 2016-03-19 DIAGNOSIS — E039 Hypothyroidism, unspecified: Secondary | ICD-10-CM | POA: Diagnosis not present

## 2016-03-19 DIAGNOSIS — E119 Type 2 diabetes mellitus without complications: Secondary | ICD-10-CM | POA: Diagnosis not present

## 2016-03-19 DIAGNOSIS — I10 Essential (primary) hypertension: Secondary | ICD-10-CM | POA: Diagnosis not present

## 2016-03-19 DIAGNOSIS — E7800 Pure hypercholesterolemia, unspecified: Secondary | ICD-10-CM | POA: Diagnosis not present

## 2016-04-04 DIAGNOSIS — L723 Sebaceous cyst: Secondary | ICD-10-CM | POA: Diagnosis not present

## 2016-04-26 DIAGNOSIS — S0501XA Injury of conjunctiva and corneal abrasion without foreign body, right eye, initial encounter: Secondary | ICD-10-CM | POA: Diagnosis not present

## 2016-06-11 DIAGNOSIS — E039 Hypothyroidism, unspecified: Secondary | ICD-10-CM | POA: Diagnosis not present

## 2016-06-11 DIAGNOSIS — M05852 Other rheumatoid arthritis with rheumatoid factor of left hip: Secondary | ICD-10-CM | POA: Diagnosis not present

## 2016-06-11 DIAGNOSIS — E119 Type 2 diabetes mellitus without complications: Secondary | ICD-10-CM | POA: Diagnosis not present

## 2016-06-11 DIAGNOSIS — M05851 Other rheumatoid arthritis with rheumatoid factor of right hip: Secondary | ICD-10-CM | POA: Diagnosis not present

## 2016-06-11 DIAGNOSIS — M25559 Pain in unspecified hip: Secondary | ICD-10-CM | POA: Diagnosis not present

## 2016-06-11 DIAGNOSIS — I10 Essential (primary) hypertension: Secondary | ICD-10-CM | POA: Diagnosis not present

## 2016-06-11 DIAGNOSIS — M47816 Spondylosis without myelopathy or radiculopathy, lumbar region: Secondary | ICD-10-CM | POA: Diagnosis not present

## 2016-06-11 DIAGNOSIS — M059 Rheumatoid arthritis with rheumatoid factor, unspecified: Secondary | ICD-10-CM | POA: Diagnosis not present

## 2016-06-11 DIAGNOSIS — Z79899 Other long term (current) drug therapy: Secondary | ICD-10-CM | POA: Diagnosis not present

## 2016-06-19 DIAGNOSIS — E039 Hypothyroidism, unspecified: Secondary | ICD-10-CM | POA: Diagnosis not present

## 2016-06-19 DIAGNOSIS — I10 Essential (primary) hypertension: Secondary | ICD-10-CM | POA: Diagnosis not present

## 2016-06-19 DIAGNOSIS — E119 Type 2 diabetes mellitus without complications: Secondary | ICD-10-CM | POA: Diagnosis not present

## 2016-06-19 DIAGNOSIS — M059 Rheumatoid arthritis with rheumatoid factor, unspecified: Secondary | ICD-10-CM | POA: Diagnosis not present

## 2016-09-06 DIAGNOSIS — H2513 Age-related nuclear cataract, bilateral: Secondary | ICD-10-CM | POA: Diagnosis not present

## 2016-09-06 DIAGNOSIS — H04123 Dry eye syndrome of bilateral lacrimal glands: Secondary | ICD-10-CM | POA: Diagnosis not present

## 2016-09-06 DIAGNOSIS — E119 Type 2 diabetes mellitus without complications: Secondary | ICD-10-CM | POA: Diagnosis not present

## 2016-09-06 DIAGNOSIS — H0289 Other specified disorders of eyelid: Secondary | ICD-10-CM | POA: Diagnosis not present

## 2016-09-06 DIAGNOSIS — H10413 Chronic giant papillary conjunctivitis, bilateral: Secondary | ICD-10-CM | POA: Diagnosis not present

## 2016-09-17 DIAGNOSIS — E119 Type 2 diabetes mellitus without complications: Secondary | ICD-10-CM | POA: Diagnosis not present

## 2016-09-17 DIAGNOSIS — E039 Hypothyroidism, unspecified: Secondary | ICD-10-CM | POA: Diagnosis not present

## 2016-09-17 DIAGNOSIS — I10 Essential (primary) hypertension: Secondary | ICD-10-CM | POA: Diagnosis not present

## 2016-09-17 DIAGNOSIS — E118 Type 2 diabetes mellitus with unspecified complications: Secondary | ICD-10-CM | POA: Diagnosis not present

## 2016-09-20 DIAGNOSIS — E7800 Pure hypercholesterolemia, unspecified: Secondary | ICD-10-CM | POA: Diagnosis not present

## 2016-09-20 DIAGNOSIS — E119 Type 2 diabetes mellitus without complications: Secondary | ICD-10-CM | POA: Diagnosis not present

## 2016-09-20 DIAGNOSIS — Z23 Encounter for immunization: Secondary | ICD-10-CM | POA: Diagnosis not present

## 2016-09-20 DIAGNOSIS — E039 Hypothyroidism, unspecified: Secondary | ICD-10-CM | POA: Diagnosis not present

## 2016-09-20 DIAGNOSIS — I10 Essential (primary) hypertension: Secondary | ICD-10-CM | POA: Diagnosis not present

## 2016-10-15 DIAGNOSIS — E039 Hypothyroidism, unspecified: Secondary | ICD-10-CM | POA: Diagnosis not present

## 2016-10-15 DIAGNOSIS — E119 Type 2 diabetes mellitus without complications: Secondary | ICD-10-CM | POA: Diagnosis not present

## 2016-10-15 DIAGNOSIS — I10 Essential (primary) hypertension: Secondary | ICD-10-CM | POA: Diagnosis not present

## 2016-10-22 DIAGNOSIS — R197 Diarrhea, unspecified: Secondary | ICD-10-CM | POA: Diagnosis not present

## 2016-12-10 DIAGNOSIS — Z79899 Other long term (current) drug therapy: Secondary | ICD-10-CM | POA: Diagnosis not present

## 2016-12-10 DIAGNOSIS — M0589 Other rheumatoid arthritis with rheumatoid factor of multiple sites: Secondary | ICD-10-CM | POA: Diagnosis not present

## 2016-12-10 DIAGNOSIS — M179 Osteoarthritis of knee, unspecified: Secondary | ICD-10-CM | POA: Diagnosis not present

## 2016-12-12 DIAGNOSIS — E039 Hypothyroidism, unspecified: Secondary | ICD-10-CM | POA: Diagnosis not present

## 2016-12-12 DIAGNOSIS — I10 Essential (primary) hypertension: Secondary | ICD-10-CM | POA: Diagnosis not present

## 2016-12-12 DIAGNOSIS — E119 Type 2 diabetes mellitus without complications: Secondary | ICD-10-CM | POA: Diagnosis not present

## 2016-12-17 DIAGNOSIS — I10 Essential (primary) hypertension: Secondary | ICD-10-CM | POA: Diagnosis not present

## 2016-12-17 DIAGNOSIS — M059 Rheumatoid arthritis with rheumatoid factor, unspecified: Secondary | ICD-10-CM | POA: Diagnosis not present

## 2016-12-17 DIAGNOSIS — Z125 Encounter for screening for malignant neoplasm of prostate: Secondary | ICD-10-CM | POA: Diagnosis not present

## 2016-12-17 DIAGNOSIS — E119 Type 2 diabetes mellitus without complications: Secondary | ICD-10-CM | POA: Diagnosis not present

## 2016-12-27 DIAGNOSIS — G4733 Obstructive sleep apnea (adult) (pediatric): Secondary | ICD-10-CM | POA: Diagnosis not present

## 2017-01-24 DIAGNOSIS — E890 Postprocedural hypothyroidism: Secondary | ICD-10-CM | POA: Diagnosis not present

## 2017-01-24 DIAGNOSIS — C73 Malignant neoplasm of thyroid gland: Secondary | ICD-10-CM | POA: Diagnosis not present

## 2017-01-24 DIAGNOSIS — D443 Neoplasm of uncertain behavior of pituitary gland: Secondary | ICD-10-CM | POA: Diagnosis not present

## 2017-01-24 DIAGNOSIS — E119 Type 2 diabetes mellitus without complications: Secondary | ICD-10-CM | POA: Diagnosis not present

## 2017-03-12 DIAGNOSIS — Z79899 Other long term (current) drug therapy: Secondary | ICD-10-CM | POA: Diagnosis not present

## 2017-03-12 DIAGNOSIS — M179 Osteoarthritis of knee, unspecified: Secondary | ICD-10-CM | POA: Diagnosis not present

## 2017-03-12 DIAGNOSIS — M0589 Other rheumatoid arthritis with rheumatoid factor of multiple sites: Secondary | ICD-10-CM | POA: Diagnosis not present

## 2017-04-02 DIAGNOSIS — G4733 Obstructive sleep apnea (adult) (pediatric): Secondary | ICD-10-CM | POA: Diagnosis not present

## 2017-04-11 DIAGNOSIS — E119 Type 2 diabetes mellitus without complications: Secondary | ICD-10-CM | POA: Diagnosis not present

## 2017-04-11 DIAGNOSIS — M059 Rheumatoid arthritis with rheumatoid factor, unspecified: Secondary | ICD-10-CM | POA: Diagnosis not present

## 2017-04-11 DIAGNOSIS — I10 Essential (primary) hypertension: Secondary | ICD-10-CM | POA: Diagnosis not present

## 2017-04-11 DIAGNOSIS — Z125 Encounter for screening for malignant neoplasm of prostate: Secondary | ICD-10-CM | POA: Diagnosis not present

## 2017-04-17 DIAGNOSIS — Z0000 Encounter for general adult medical examination without abnormal findings: Secondary | ICD-10-CM | POA: Diagnosis not present

## 2017-04-17 DIAGNOSIS — E7800 Pure hypercholesterolemia, unspecified: Secondary | ICD-10-CM | POA: Diagnosis not present

## 2017-04-17 DIAGNOSIS — E118 Type 2 diabetes mellitus with unspecified complications: Secondary | ICD-10-CM | POA: Diagnosis not present

## 2017-05-24 DIAGNOSIS — L918 Other hypertrophic disorders of the skin: Secondary | ICD-10-CM | POA: Diagnosis not present

## 2017-05-24 DIAGNOSIS — E118 Type 2 diabetes mellitus with unspecified complications: Secondary | ICD-10-CM | POA: Diagnosis not present

## 2017-06-14 DIAGNOSIS — M0589 Other rheumatoid arthritis with rheumatoid factor of multiple sites: Secondary | ICD-10-CM | POA: Diagnosis not present

## 2017-06-14 DIAGNOSIS — M179 Osteoarthritis of knee, unspecified: Secondary | ICD-10-CM | POA: Diagnosis not present

## 2017-06-14 DIAGNOSIS — E669 Obesity, unspecified: Secondary | ICD-10-CM | POA: Diagnosis not present

## 2017-06-14 DIAGNOSIS — M12861 Other specific arthropathies, not elsewhere classified, right knee: Secondary | ICD-10-CM | POA: Diagnosis not present

## 2017-06-14 DIAGNOSIS — Z79899 Other long term (current) drug therapy: Secondary | ICD-10-CM | POA: Diagnosis not present

## 2017-07-16 DIAGNOSIS — E7800 Pure hypercholesterolemia, unspecified: Secondary | ICD-10-CM | POA: Diagnosis not present

## 2017-07-16 DIAGNOSIS — E118 Type 2 diabetes mellitus with unspecified complications: Secondary | ICD-10-CM | POA: Diagnosis not present

## 2017-07-22 DIAGNOSIS — E119 Type 2 diabetes mellitus without complications: Secondary | ICD-10-CM | POA: Diagnosis not present

## 2017-07-22 DIAGNOSIS — E039 Hypothyroidism, unspecified: Secondary | ICD-10-CM | POA: Diagnosis not present

## 2017-07-22 DIAGNOSIS — E7800 Pure hypercholesterolemia, unspecified: Secondary | ICD-10-CM | POA: Diagnosis not present

## 2017-07-22 DIAGNOSIS — I10 Essential (primary) hypertension: Secondary | ICD-10-CM | POA: Diagnosis not present

## 2017-11-05 DIAGNOSIS — H0289 Other specified disorders of eyelid: Secondary | ICD-10-CM | POA: Diagnosis not present

## 2017-11-05 DIAGNOSIS — D485 Neoplasm of uncertain behavior of skin: Secondary | ICD-10-CM | POA: Diagnosis not present

## 2017-11-05 DIAGNOSIS — H10413 Chronic giant papillary conjunctivitis, bilateral: Secondary | ICD-10-CM | POA: Diagnosis not present

## 2017-11-05 DIAGNOSIS — H04123 Dry eye syndrome of bilateral lacrimal glands: Secondary | ICD-10-CM | POA: Diagnosis not present

## 2017-11-05 DIAGNOSIS — L918 Other hypertrophic disorders of the skin: Secondary | ICD-10-CM | POA: Diagnosis not present

## 2017-11-05 DIAGNOSIS — L821 Other seborrheic keratosis: Secondary | ICD-10-CM | POA: Diagnosis not present

## 2017-11-05 DIAGNOSIS — C4441 Basal cell carcinoma of skin of scalp and neck: Secondary | ICD-10-CM | POA: Diagnosis not present

## 2017-11-05 DIAGNOSIS — H2513 Age-related nuclear cataract, bilateral: Secondary | ICD-10-CM | POA: Diagnosis not present

## 2017-11-05 DIAGNOSIS — L720 Epidermal cyst: Secondary | ICD-10-CM | POA: Diagnosis not present

## 2017-11-05 DIAGNOSIS — E119 Type 2 diabetes mellitus without complications: Secondary | ICD-10-CM | POA: Diagnosis not present

## 2017-11-06 DIAGNOSIS — M179 Osteoarthritis of knee, unspecified: Secondary | ICD-10-CM | POA: Diagnosis not present

## 2017-11-06 DIAGNOSIS — Z79899 Other long term (current) drug therapy: Secondary | ICD-10-CM | POA: Diagnosis not present

## 2017-11-06 DIAGNOSIS — M0589 Other rheumatoid arthritis with rheumatoid factor of multiple sites: Secondary | ICD-10-CM | POA: Diagnosis not present

## 2017-11-06 DIAGNOSIS — E790 Hyperuricemia without signs of inflammatory arthritis and tophaceous disease: Secondary | ICD-10-CM | POA: Diagnosis not present

## 2017-11-06 DIAGNOSIS — E669 Obesity, unspecified: Secondary | ICD-10-CM | POA: Diagnosis not present

## 2017-12-05 DIAGNOSIS — C4441 Basal cell carcinoma of skin of scalp and neck: Secondary | ICD-10-CM | POA: Diagnosis not present

## 2018-01-15 DIAGNOSIS — E039 Hypothyroidism, unspecified: Secondary | ICD-10-CM | POA: Diagnosis not present

## 2018-01-15 DIAGNOSIS — I10 Essential (primary) hypertension: Secondary | ICD-10-CM | POA: Diagnosis not present

## 2018-01-15 DIAGNOSIS — E119 Type 2 diabetes mellitus without complications: Secondary | ICD-10-CM | POA: Diagnosis not present

## 2018-01-22 DIAGNOSIS — I10 Essential (primary) hypertension: Secondary | ICD-10-CM | POA: Diagnosis not present

## 2018-01-22 DIAGNOSIS — E7800 Pure hypercholesterolemia, unspecified: Secondary | ICD-10-CM | POA: Diagnosis not present

## 2018-01-22 DIAGNOSIS — E119 Type 2 diabetes mellitus without complications: Secondary | ICD-10-CM | POA: Diagnosis not present

## 2018-01-22 DIAGNOSIS — E039 Hypothyroidism, unspecified: Secondary | ICD-10-CM | POA: Diagnosis not present

## 2018-01-24 DIAGNOSIS — I10 Essential (primary) hypertension: Secondary | ICD-10-CM | POA: Diagnosis not present

## 2018-01-24 DIAGNOSIS — E118 Type 2 diabetes mellitus with unspecified complications: Secondary | ICD-10-CM | POA: Diagnosis not present

## 2018-01-24 DIAGNOSIS — C73 Malignant neoplasm of thyroid gland: Secondary | ICD-10-CM | POA: Diagnosis not present

## 2018-01-24 DIAGNOSIS — D443 Neoplasm of uncertain behavior of pituitary gland: Secondary | ICD-10-CM | POA: Diagnosis not present

## 2018-01-24 DIAGNOSIS — E890 Postprocedural hypothyroidism: Secondary | ICD-10-CM | POA: Diagnosis not present

## 2018-02-04 DIAGNOSIS — M79642 Pain in left hand: Secondary | ICD-10-CM | POA: Diagnosis not present

## 2018-02-04 DIAGNOSIS — M19041 Primary osteoarthritis, right hand: Secondary | ICD-10-CM | POA: Diagnosis not present

## 2018-02-04 DIAGNOSIS — E790 Hyperuricemia without signs of inflammatory arthritis and tophaceous disease: Secondary | ICD-10-CM | POA: Diagnosis not present

## 2018-02-04 DIAGNOSIS — Z79899 Other long term (current) drug therapy: Secondary | ICD-10-CM | POA: Diagnosis not present

## 2018-02-04 DIAGNOSIS — M79643 Pain in unspecified hand: Secondary | ICD-10-CM | POA: Diagnosis not present

## 2018-02-04 DIAGNOSIS — E669 Obesity, unspecified: Secondary | ICD-10-CM | POA: Diagnosis not present

## 2018-02-04 DIAGNOSIS — M0589 Other rheumatoid arthritis with rheumatoid factor of multiple sites: Secondary | ICD-10-CM | POA: Diagnosis not present

## 2018-02-04 DIAGNOSIS — M79641 Pain in right hand: Secondary | ICD-10-CM | POA: Diagnosis not present

## 2018-02-04 DIAGNOSIS — M19042 Primary osteoarthritis, left hand: Secondary | ICD-10-CM | POA: Diagnosis not present

## 2018-02-04 DIAGNOSIS — M179 Osteoarthritis of knee, unspecified: Secondary | ICD-10-CM | POA: Diagnosis not present

## 2018-03-27 DIAGNOSIS — Z9889 Other specified postprocedural states: Secondary | ICD-10-CM | POA: Diagnosis not present

## 2018-03-27 DIAGNOSIS — E237 Disorder of pituitary gland, unspecified: Secondary | ICD-10-CM | POA: Diagnosis not present

## 2018-03-27 DIAGNOSIS — Z01818 Encounter for other preprocedural examination: Secondary | ICD-10-CM | POA: Diagnosis not present

## 2018-03-27 DIAGNOSIS — F40240 Claustrophobia: Secondary | ICD-10-CM | POA: Diagnosis not present

## 2018-03-27 DIAGNOSIS — E236 Other disorders of pituitary gland: Secondary | ICD-10-CM | POA: Diagnosis not present

## 2018-03-27 DIAGNOSIS — Z923 Personal history of irradiation: Secondary | ICD-10-CM | POA: Diagnosis not present

## 2018-04-08 DIAGNOSIS — D1801 Hemangioma of skin and subcutaneous tissue: Secondary | ICD-10-CM | POA: Diagnosis not present

## 2018-04-08 DIAGNOSIS — D692 Other nonthrombocytopenic purpura: Secondary | ICD-10-CM | POA: Diagnosis not present

## 2018-04-08 DIAGNOSIS — L918 Other hypertrophic disorders of the skin: Secondary | ICD-10-CM | POA: Diagnosis not present

## 2018-04-08 DIAGNOSIS — B078 Other viral warts: Secondary | ICD-10-CM | POA: Diagnosis not present

## 2018-04-08 DIAGNOSIS — L821 Other seborrheic keratosis: Secondary | ICD-10-CM | POA: Diagnosis not present

## 2018-04-08 DIAGNOSIS — Z85828 Personal history of other malignant neoplasm of skin: Secondary | ICD-10-CM | POA: Diagnosis not present

## 2018-04-17 DIAGNOSIS — E118 Type 2 diabetes mellitus with unspecified complications: Secondary | ICD-10-CM | POA: Diagnosis not present

## 2018-04-17 DIAGNOSIS — E039 Hypothyroidism, unspecified: Secondary | ICD-10-CM | POA: Diagnosis not present

## 2018-04-17 DIAGNOSIS — D443 Neoplasm of uncertain behavior of pituitary gland: Secondary | ICD-10-CM | POA: Diagnosis not present

## 2018-04-17 DIAGNOSIS — I10 Essential (primary) hypertension: Secondary | ICD-10-CM | POA: Diagnosis not present

## 2018-04-24 DIAGNOSIS — E119 Type 2 diabetes mellitus without complications: Secondary | ICD-10-CM | POA: Diagnosis not present

## 2018-04-24 DIAGNOSIS — E7800 Pure hypercholesterolemia, unspecified: Secondary | ICD-10-CM | POA: Diagnosis not present

## 2018-04-24 DIAGNOSIS — I70213 Atherosclerosis of native arteries of extremities with intermittent claudication, bilateral legs: Secondary | ICD-10-CM | POA: Diagnosis not present

## 2018-04-24 DIAGNOSIS — Z0000 Encounter for general adult medical examination without abnormal findings: Secondary | ICD-10-CM | POA: Diagnosis not present

## 2018-04-24 DIAGNOSIS — E039 Hypothyroidism, unspecified: Secondary | ICD-10-CM | POA: Diagnosis not present

## 2018-04-24 DIAGNOSIS — I10 Essential (primary) hypertension: Secondary | ICD-10-CM | POA: Diagnosis not present

## 2018-05-07 DIAGNOSIS — E669 Obesity, unspecified: Secondary | ICD-10-CM | POA: Diagnosis not present

## 2018-05-07 DIAGNOSIS — E790 Hyperuricemia without signs of inflammatory arthritis and tophaceous disease: Secondary | ICD-10-CM | POA: Diagnosis not present

## 2018-05-07 DIAGNOSIS — M179 Osteoarthritis of knee, unspecified: Secondary | ICD-10-CM | POA: Diagnosis not present

## 2018-05-07 DIAGNOSIS — M0589 Other rheumatoid arthritis with rheumatoid factor of multiple sites: Secondary | ICD-10-CM | POA: Diagnosis not present

## 2018-05-07 DIAGNOSIS — Z79899 Other long term (current) drug therapy: Secondary | ICD-10-CM | POA: Diagnosis not present

## 2018-05-07 DIAGNOSIS — M79643 Pain in unspecified hand: Secondary | ICD-10-CM | POA: Diagnosis not present

## 2018-05-12 DIAGNOSIS — I10 Essential (primary) hypertension: Secondary | ICD-10-CM | POA: Diagnosis not present

## 2018-05-12 DIAGNOSIS — I739 Peripheral vascular disease, unspecified: Secondary | ICD-10-CM | POA: Diagnosis not present

## 2018-05-12 DIAGNOSIS — Z8673 Personal history of transient ischemic attack (TIA), and cerebral infarction without residual deficits: Secondary | ICD-10-CM | POA: Diagnosis not present

## 2018-05-12 DIAGNOSIS — F17200 Nicotine dependence, unspecified, uncomplicated: Secondary | ICD-10-CM | POA: Diagnosis not present

## 2018-05-26 DIAGNOSIS — J019 Acute sinusitis, unspecified: Secondary | ICD-10-CM | POA: Diagnosis not present

## 2018-06-19 DIAGNOSIS — I6521 Occlusion and stenosis of right carotid artery: Secondary | ICD-10-CM | POA: Diagnosis not present

## 2018-06-19 DIAGNOSIS — I739 Peripheral vascular disease, unspecified: Secondary | ICD-10-CM | POA: Diagnosis not present

## 2018-07-09 DIAGNOSIS — I10 Essential (primary) hypertension: Secondary | ICD-10-CM | POA: Diagnosis not present

## 2018-07-09 DIAGNOSIS — I6522 Occlusion and stenosis of left carotid artery: Secondary | ICD-10-CM | POA: Diagnosis not present

## 2018-07-09 DIAGNOSIS — M48062 Spinal stenosis, lumbar region with neurogenic claudication: Secondary | ICD-10-CM | POA: Diagnosis not present

## 2018-07-09 DIAGNOSIS — I739 Peripheral vascular disease, unspecified: Secondary | ICD-10-CM | POA: Diagnosis not present

## 2018-07-13 ENCOUNTER — Encounter (HOSPITAL_COMMUNITY): Payer: Self-pay | Admitting: Family Medicine

## 2018-07-13 ENCOUNTER — Ambulatory Visit (HOSPITAL_COMMUNITY)
Admission: EM | Admit: 2018-07-13 | Discharge: 2018-07-13 | Disposition: A | Discharge status: Home / Self Care | Payer: Medicare Other | Attending: Family Medicine | Admitting: Family Medicine

## 2018-07-13 DIAGNOSIS — S0501XA Injury of conjunctiva and corneal abrasion without foreign body, right eye, initial encounter: Secondary | ICD-10-CM | POA: Diagnosis not present

## 2018-07-13 DIAGNOSIS — S0502XA Injury of conjunctiva and corneal abrasion without foreign body, left eye, initial encounter: Secondary | ICD-10-CM

## 2018-07-13 MED ORDER — TOBRAMYCIN 0.3 % OP OINT: 1.0000 "application " | TOPICAL_OINTMENT | Freq: Three times a day (TID) | OPHTHALMIC | 0 refills | Status: DC

## 2018-07-13 MED ORDER — TETRACAINE HCL 0.5 % OP SOLN
OPHTHALMIC | Status: AC
  Filled 2018-07-13: qty 4

## 2018-07-13 NOTE — ED Triage Notes (Signed)
Pt here after something got in left eye; pt sts irritation and redness

## 2018-07-13 NOTE — ED Provider Notes (Signed)
[place]   [id number] 07/13/18 Arrival Time: 1003   SUBJECTIVE:  [name] is a 67 y.o. male who presents to the urgent care with complaint of left eye irritation thought to be foreign body.  He was sitting on his porch at about 5:00 this morning when something apparently got in his left eye.  He is already flushed his eye out.  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  Past medical history significant for rheumatoid arthritis History reviewed. No pertinent past medical history. History reviewed. No pertinent family history. Social History   Socioeconomic History  . Marital status: Married    Spouse name: Not on file  . Number of children: Not on file  . Years of education: Not on file  . Highest education level: Not on file  Occupational History  . Not on file  Social Needs  . Financial resource strain: Not on file  . Food insecurity:    Worry: Not on file    Inability: Not on file  . Transportation needs:    Medical: Not on file    Non-medical: Not on file  Tobacco Use  . Smoking status: Not on file  Substance and Sexual Activity  . Alcohol use: Not on file  . Drug use: Not on file  . Sexual activity: Not on file  Lifestyle  . Physical activity:    Days per week: Not on file    Minutes per session: Not on file  . Stress: Not on file  Relationships  . Social connections:    Talks on phone: Not on file    Gets together: Not on file    Attends religious service: Not on file    Active member of club or organization: Not on file    Attends meetings of clubs or organizations: Not on file    Relationship status: Not on file  . Intimate partner violence:    Fear of current or ex partner: Not on file    Emotionally abused: Not on file    Physically abused: Not on file    Forced sexual activity: Not on file  Other Topics Concern  . Not on file  Social History Narrative  . Not on file    No outpatient medications have been marked as taking for the 07/13/18 encounter [facility] Encounter).   No Known Allergies    ROS: As per HPI, remainder of ROS negative.   OBJECTIVE:   Vitals:   07/13/18 1028  BP: (!) 158/68  Pulse: 95  Resp: 18  Temp: 98.2 F (36.8 C)  TempSrc: Oral  SpO2: 98%     General appearance: alert; no distress Eyes: PERRL; EOMI; conjunctiva injected on left side; high inverted and small foreign body removed; fluorescein stain shows no corneal abrasion but there is uptake at the limbus laterally; I was thoroughly irrigated HENT: normocephalic; atraumatic;  Back: no CVA tenderness Extremities: no cyanosis or edema; symmetrical with no gross deformities Skin: warm and dry Neurologic: normal gait; grossly normal Psychological: alert and cooperative; normal mood and affect        ASSESSMENT & PLAN:  1. Abrasion of left cornea, initial encounter   Return for continued symptoms  Meds ordered this encounter  Medications  . tobramycin (TOBREX) 0.3 % ophthalmic ointment    Sig: Place 1 application into the right eye 3 (three) times daily.    Dispense:  3.5 g  Refill:  0    Reviewed expectations re: course of current medical issues. Questions answered. Outlined signs and symptoms indicating need for more acute intervention. Patient verbalized understanding. After Visit Summary given.    Procedures: Lid eversion, flouroscein staining, thorough Lillette Boxer, MD 07/13/18 1055

## 2018-08-05 ENCOUNTER — Encounter (HOSPITAL_COMMUNITY): Payer: Self-pay | Admitting: Emergency Medicine

## 2018-08-05 ENCOUNTER — Emergency Department (HOSPITAL_COMMUNITY)
Admission: EM | Admit: 2018-08-05 | Discharge: 2018-08-06 | Disposition: A | Discharge status: Home / Self Care | Payer: Medicare Other | Attending: Emergency Medicine | Admitting: Emergency Medicine

## 2018-08-05 ENCOUNTER — Other Ambulatory Visit: Payer: Self-pay

## 2018-08-05 DIAGNOSIS — T782XXA Anaphylactic shock, unspecified, initial encounter: Secondary | ICD-10-CM

## 2018-08-05 DIAGNOSIS — R0602 Shortness of breath: Secondary | ICD-10-CM | POA: Diagnosis not present

## 2018-08-05 DIAGNOSIS — Z79899 Other long term (current) drug therapy: Secondary | ICD-10-CM | POA: Diagnosis not present

## 2018-08-05 DIAGNOSIS — R21 Rash and other nonspecific skin eruption: Secondary | ICD-10-CM | POA: Diagnosis not present

## 2018-08-05 DIAGNOSIS — Z7982 Long term (current) use of aspirin: Secondary | ICD-10-CM | POA: Insufficient documentation

## 2018-08-05 DIAGNOSIS — Z7984 Long term (current) use of oral hypoglycemic drugs: Secondary | ICD-10-CM | POA: Insufficient documentation

## 2018-08-05 DIAGNOSIS — Z91030 Bee allergy status: Secondary | ICD-10-CM | POA: Insufficient documentation

## 2018-08-05 MED ORDER — EPINEPHRINE 0.3 MG/0.3ML IJ SOAJ
0.3000 mg | Freq: Once | INTRAMUSCULAR | Status: AC
  Administered 2018-08-05: 0.3 mg via INTRAMUSCULAR
  Filled 2018-08-05: qty 0.3

## 2018-08-05 MED ORDER — FAMOTIDINE 20 MG PO TABS
20.0000 mg | ORAL_TABLET | Freq: Once | ORAL | Status: AC
  Administered 2018-08-05: 20 mg via ORAL
  Filled 2018-08-05: qty 1

## 2018-08-05 NOTE — ED Provider Notes (Signed)
Patient placed in Quick Look pathway, seen and evaluated   Chief Complaint: allergic rxn  HPI:   Patient is 67 year old male who presents emergency department today for evaluation of an allergic reaction.  About 30 minutes ago patient was stung by about 50 yellow jackets.  Reports shortness of breath currently.  Has some chest tightness.  No difficult he swallowing, no throat swelling or lip swelling.  ROS: allergic rxn reaction (one)  Physical Exam:   Gen: No distress  Neuro: Awake and Alert  Skin: Warm    Focused Exam: Patient diaphoretic and is on 2 L nasal cannula satting at 95% on room air.  Has wheezing in the lungs, more so on the right.  No obvious angioedema.  Nursing staff informed that patient needs the next room available.  Initiation of care has begun. The patient has been counseled on the process, plan, and necessity for staying for the completion/evaluation, and the remainder of the medical screening examination     Bishop Dublin 08/05/18 1947    Carmin Muskrat, MD 08/06/18 0030

## 2018-08-05 NOTE — ED Triage Notes (Signed)
Pt was stung by many bees about 30 minutes ago, stung on back, neck, head, legs and arms.  Pt has welts but is not in respiratory distress, able to speak in complete sentences.  Pt reports he feels as if he is going to pass out and his chest is tight.  Pt took 2 benadryl about 30 minutes ago.

## 2018-08-05 NOTE — ED Notes (Signed)
Patient ambulatory to the restroom at this time.

## 2018-08-05 NOTE — ED Provider Notes (Signed)
[facility] EMERGENCY DEPARTMENT Provider Note   CSN: [id number] Arrival date & time: 08/05/18  1926     History   Chief Complaint Chief Complaint  Patient presents with  . Allergic Reaction    HPI [name] is a 67 y.o. male.  Patient is a 67 year old man with past medical history of obesity and GERD who presents with pruritic rash and shortness of breath.  The patient reports that the rash began after he ran over beehive with his lawnmower and was stung by approximately 67 wasps.  The patient reports that he initially had a urticaria and approximately 45 minutes later developed shortness of breath.  The history is provided by the patient.  Allergic Reaction  Presenting symptoms: difficulty breathing, itching, rash and swelling   Severity:  Severe Duration:  45 minutes Prior allergic episodes:  Insect allergies Context: insect bite/sting (wasp)   Relieved by:  Nothing Worsened by:  Nothing Ineffective treatments:  Antihistamines   History reviewed. No pertinent past medical history.  Patient Active Problem List   Diagnosis Date Noted  . OBESITY-MORBID (>100') 11/24/2008  . OVERWEIGHT/OBESITY 11/24/2008  . PALPITATIONS 11/24/2008    History reviewed. No pertinent surgical history.      Home Medications    Prior to Admission medications   Medication Sig Start Date End Date Taking? Authorizing Provider  albuterol (PROVENTIL HFA;VENTOLIN HFA) 108 (90 Base) MCG/ACT inhaler Inhale 1-2 puffs into the lungs every 6 (six) hours as needed for wheezing or shortness of breath.   Yes [provider]  amLODipine (NORVASC) 5 MG tablet Take 5 mg by mouth daily.   Yes [provider]  aspirin EC 81 MG tablet Take 81 mg by mouth every evening.   Yes [provider]  chlorthalidone (HYGROTON) 25 MG tablet Take 25 mg by mouth daily.   Yes [provider]  escitalopram (LEXAPRO) 10 MG tablet Take 10 mg by mouth daily.   Yes  [provider]  glimepiride (AMARYL) 4 MG tablet Take 8 mg by mouth daily with breakfast.   Yes [provider]  levothyroxine (SYNTHROID, LEVOTHROID) 112 MCG tablet Take 224 mcg by mouth once a week. Sunday   Yes [provider]  levothyroxine (SYNTHROID, LEVOTHROID) 200 MCG tablet Take 200 mcg by mouth See admin instructions. Monday-Saturday   Yes [provider]  liraglutide (VICTOZA) 18 MG/3ML SOPN Inject 0.6 mg into the skin at bedtime.   Yes [provider]  losartan (COZAAR) 50 MG tablet Take 100 mg by mouth daily.   Yes [provider]  metFORMIN (GLUCOPHAGE) 500 MG tablet Take 500 mg by mouth every evening.   Yes [provider]  methotrexate (RHEUMATREX) 2.5 MG tablet Take 12.5 mg by mouth once a week. Caution:Chemotherapy. Protect from light.   Yes [provider]  naproxen sodium (ALEVE) 220 MG tablet Take 220 mg by mouth 2 (two) times daily as needed (pain).   Yes [provider]  omega-3 acid ethyl esters (LOVAZA) 1 g capsule Take 1 g by mouth daily.   Yes [provider]  pravastatin (PRAVACHOL) 40 MG tablet Take 40 mg by mouth every evening.   Yes [provider]  Tofacitinib Citrate (XELJANZ) 5 MG TABS Take 5 mg by mouth 2 (two) times daily.   Yes [provider]  EPINEPHrine 0.3 mg/0.3 mL IJ SOAJ injection Inject 0.3 mLs (0.3 mg total) into the muscle as needed for up to 2 doses. 08/06/18  Irven Baltimore, MD  tobramycin (TOBREX) 0.3 % ophthalmic ointment Place 1 application into the right eye 3 (three) times daily. Patient not taking: Reported on 08/05/2018 07/13/18   Robyn Haber, MD    Family History No family history on file.  Social History Social History   Tobacco Use  . Smoking status: Not on file  Substance Use Topics  . Alcohol use: Not on file  . Drug use: Not on file     Allergies   Atorvastatin; Farxiga [dapagliflozin]; Folic acid; Humira  [adalimumab]; Metformin and related; Plavix [clopidogrel bisulfate]; and Simvastatin   Review of Systems Review of Systems  Constitutional: Negative for fever.  HENT: Negative for facial swelling.   Eyes: Negative for pain.  Respiratory: Positive for shortness of breath. Negative for cough.   Cardiovascular: Negative for chest pain and palpitations.  Gastrointestinal: Positive for nausea. Negative for abdominal pain and vomiting.  Musculoskeletal: Negative for back pain.  Skin: Positive for itching and rash.  Neurological: Negative for syncope.  All other systems reviewed and are negative.    Physical Exam Updated Vital Signs BP 119/61   Pulse 72   Temp 98.7 F (37.1 C) (Oral)   Resp 14   SpO2 98%   Physical Exam  Constitutional: He appears well-developed and well-nourished.  HENT:  Head: Normocephalic and atraumatic.  Eyes: Conjunctivae are normal.  Neck: Neck supple.  Cardiovascular: Normal rate and regular rhythm.  No murmur heard. Pulmonary/Chest: Effort normal and breath sounds normal. No respiratory distress. He has no wheezes.  Abdominal: Soft. There is no tenderness.  Musculoskeletal: He exhibits no edema.  Neurological: He is alert.  Skin: Skin is warm and dry. Rash noted.  Urticarial rash over her back and bilateral upper extremities  Psychiatric: He has a normal mood and affect.  Nursing note and vitals reviewed.    ED Treatments / Results  Labs (all labs ordered are listed, but only abnormal results are displayed) Labs Reviewed - No data to display  EKG EKG Interpretation  Date/Time:  Tuesday August 05 2018 19:36:42 EDT Ventricular Rate:  66 PR Interval:  128 QRS Duration: 100 QT Interval:  476 QTC Calculation: 499 R Axis:   68 Text Interpretation:  Normal sinus rhythm Artifact T wave abnormality Abnormal ekg Confirmed by Carmin Muskrat [phone]) on 08/05/2018 10:12:47 PM   Radiology No results found.  Procedures Procedures (including  critical care time)  Medications Ordered in ED Medications  EPINEPHrine (EPI-PEN) injection 0.3 mg (0.3 mg Intramuscular Given 08/05/18 2011)  famotidine (PEPCID) tablet 20 mg (20 mg Oral Given 08/05/18 2010)     Initial Impression / Assessment and Plan / ED Course  I have reviewed the triage vital signs and the nursing notes.  Pertinent labs & imaging results that were available during my care of the patient were reviewed by me and considered in my medical decision making (see chart for details).     The patient is a 67 year old man who presents with anaphylaxis after wasp stings.  The patient was given 50 mg of Benadryl at home, in the emergency department he was given IM epinephrine and Pepcid.  The patient was observed for 4 hours with no recurrence of symptoms.  He reports significant improvement.  The patient is instructed to take scheduled Benadryl and Pepcid for the next day.  Patient was discharged with 2 EpiPen's and strict return precautions.  The patient endorses understanding of and agreement with return precautions.  Patient care supervised by Dr. Vanita Panda.  Irven Baltimore, MD  Final Clinical Impressions(s) / ED Diagnoses   Final diagnoses:  Anaphylaxis, initial encounter    ED Discharge Orders         Ordered    EPINEPHrine 0.3 mg/0.3 mL IJ SOAJ injection  As needed     08/06/18 Christiana Fuchs, MD 08/06/18 3567    Carmin Muskrat, MD 08/07/18 [phone]

## 2018-08-06 MED ORDER — EPINEPHRINE 0.3 MG/0.3ML IJ SOAJ: 0.3000 mg | INTRAMUSCULAR | 0 refills | Status: DC | PRN

## 2018-08-06 NOTE — ED Notes (Signed)
Patient Alert and oriented to baseline. Stable and ambulatory to baseline. Patient verbalized understanding of the discharge instructions.  Patient belongings were taken by the patient.   

## 2018-08-07 DIAGNOSIS — Z79899 Other long term (current) drug therapy: Secondary | ICD-10-CM | POA: Diagnosis not present

## 2018-08-07 DIAGNOSIS — R0602 Shortness of breath: Secondary | ICD-10-CM | POA: Diagnosis not present

## 2018-08-07 DIAGNOSIS — M79671 Pain in right foot: Secondary | ICD-10-CM | POA: Diagnosis not present

## 2018-08-07 DIAGNOSIS — M0589 Other rheumatoid arthritis with rheumatoid factor of multiple sites: Secondary | ICD-10-CM | POA: Diagnosis not present

## 2018-08-07 DIAGNOSIS — M79643 Pain in unspecified hand: Secondary | ICD-10-CM | POA: Diagnosis not present

## 2018-08-07 DIAGNOSIS — M25561 Pain in right knee: Secondary | ICD-10-CM | POA: Diagnosis not present

## 2018-08-07 DIAGNOSIS — M179 Osteoarthritis of knee, unspecified: Secondary | ICD-10-CM | POA: Diagnosis not present

## 2018-08-07 DIAGNOSIS — E669 Obesity, unspecified: Secondary | ICD-10-CM | POA: Diagnosis not present

## 2018-08-07 DIAGNOSIS — E790 Hyperuricemia without signs of inflammatory arthritis and tophaceous disease: Secondary | ICD-10-CM | POA: Diagnosis not present

## 2018-08-19 DIAGNOSIS — I10 Essential (primary) hypertension: Secondary | ICD-10-CM | POA: Diagnosis not present

## 2018-08-19 DIAGNOSIS — N183 Chronic kidney disease, stage 3 (moderate): Secondary | ICD-10-CM | POA: Diagnosis not present

## 2018-08-19 DIAGNOSIS — Z23 Encounter for immunization: Secondary | ICD-10-CM | POA: Diagnosis not present

## 2018-10-07 DIAGNOSIS — M0589 Other rheumatoid arthritis with rheumatoid factor of multiple sites: Secondary | ICD-10-CM | POA: Diagnosis not present

## 2018-10-07 DIAGNOSIS — Z79899 Other long term (current) drug therapy: Secondary | ICD-10-CM | POA: Diagnosis not present

## 2018-11-04 DIAGNOSIS — N390 Urinary tract infection, site not specified: Secondary | ICD-10-CM | POA: Diagnosis not present

## 2018-11-04 DIAGNOSIS — E039 Hypothyroidism, unspecified: Secondary | ICD-10-CM | POA: Diagnosis not present

## 2018-11-04 DIAGNOSIS — Z125 Encounter for screening for malignant neoplasm of prostate: Secondary | ICD-10-CM | POA: Diagnosis not present

## 2018-11-04 DIAGNOSIS — E119 Type 2 diabetes mellitus without complications: Secondary | ICD-10-CM | POA: Diagnosis not present

## 2018-11-04 DIAGNOSIS — E7800 Pure hypercholesterolemia, unspecified: Secondary | ICD-10-CM | POA: Diagnosis not present

## 2018-11-04 DIAGNOSIS — I10 Essential (primary) hypertension: Secondary | ICD-10-CM | POA: Diagnosis not present

## 2018-11-05 DIAGNOSIS — H04123 Dry eye syndrome of bilateral lacrimal glands: Secondary | ICD-10-CM | POA: Diagnosis not present

## 2018-11-05 DIAGNOSIS — E119 Type 2 diabetes mellitus without complications: Secondary | ICD-10-CM | POA: Diagnosis not present

## 2018-11-05 DIAGNOSIS — H10413 Chronic giant papillary conjunctivitis, bilateral: Secondary | ICD-10-CM | POA: Diagnosis not present

## 2018-11-05 DIAGNOSIS — H2513 Age-related nuclear cataract, bilateral: Secondary | ICD-10-CM | POA: Diagnosis not present

## 2018-11-11 DIAGNOSIS — I10 Essential (primary) hypertension: Secondary | ICD-10-CM | POA: Diagnosis not present

## 2018-11-11 DIAGNOSIS — N410 Acute prostatitis: Secondary | ICD-10-CM | POA: Diagnosis not present

## 2018-11-11 DIAGNOSIS — Z0000 Encounter for general adult medical examination without abnormal findings: Secondary | ICD-10-CM | POA: Diagnosis not present

## 2018-11-11 DIAGNOSIS — Z79899 Other long term (current) drug therapy: Secondary | ICD-10-CM | POA: Diagnosis not present

## 2018-11-11 DIAGNOSIS — J4540 Moderate persistent asthma, uncomplicated: Secondary | ICD-10-CM | POA: Diagnosis not present

## 2018-11-11 DIAGNOSIS — M0589 Other rheumatoid arthritis with rheumatoid factor of multiple sites: Secondary | ICD-10-CM | POA: Diagnosis not present

## 2018-11-11 DIAGNOSIS — N183 Chronic kidney disease, stage 3 (moderate): Secondary | ICD-10-CM | POA: Diagnosis not present

## 2018-11-11 DIAGNOSIS — E119 Type 2 diabetes mellitus without complications: Secondary | ICD-10-CM | POA: Diagnosis not present

## 2018-11-11 DIAGNOSIS — E039 Hypothyroidism, unspecified: Secondary | ICD-10-CM | POA: Diagnosis not present

## 2018-12-08 DIAGNOSIS — M179 Osteoarthritis of knee, unspecified: Secondary | ICD-10-CM | POA: Diagnosis not present

## 2018-12-08 DIAGNOSIS — R300 Dysuria: Secondary | ICD-10-CM | POA: Diagnosis not present

## 2018-12-08 DIAGNOSIS — R0602 Shortness of breath: Secondary | ICD-10-CM | POA: Diagnosis not present

## 2018-12-08 DIAGNOSIS — Z79899 Other long term (current) drug therapy: Secondary | ICD-10-CM | POA: Diagnosis not present

## 2018-12-08 DIAGNOSIS — M25561 Pain in right knee: Secondary | ICD-10-CM | POA: Diagnosis not present

## 2018-12-08 DIAGNOSIS — E790 Hyperuricemia without signs of inflammatory arthritis and tophaceous disease: Secondary | ICD-10-CM | POA: Diagnosis not present

## 2018-12-08 DIAGNOSIS — M0589 Other rheumatoid arthritis with rheumatoid factor of multiple sites: Secondary | ICD-10-CM | POA: Diagnosis not present

## 2019-02-06 DIAGNOSIS — E7800 Pure hypercholesterolemia, unspecified: Secondary | ICD-10-CM | POA: Diagnosis not present

## 2019-02-06 DIAGNOSIS — N183 Chronic kidney disease, stage 3 (moderate): Secondary | ICD-10-CM | POA: Diagnosis not present

## 2019-02-06 DIAGNOSIS — Z125 Encounter for screening for malignant neoplasm of prostate: Secondary | ICD-10-CM | POA: Diagnosis not present

## 2019-02-06 DIAGNOSIS — E039 Hypothyroidism, unspecified: Secondary | ICD-10-CM | POA: Diagnosis not present

## 2019-02-06 DIAGNOSIS — I10 Essential (primary) hypertension: Secondary | ICD-10-CM | POA: Diagnosis not present

## 2019-02-06 DIAGNOSIS — E119 Type 2 diabetes mellitus without complications: Secondary | ICD-10-CM | POA: Diagnosis not present

## 2019-02-11 DIAGNOSIS — E119 Type 2 diabetes mellitus without complications: Secondary | ICD-10-CM | POA: Diagnosis not present

## 2019-02-11 DIAGNOSIS — Z125 Encounter for screening for malignant neoplasm of prostate: Secondary | ICD-10-CM | POA: Diagnosis not present

## 2019-02-11 DIAGNOSIS — E039 Hypothyroidism, unspecified: Secondary | ICD-10-CM | POA: Diagnosis not present

## 2019-02-11 DIAGNOSIS — N183 Chronic kidney disease, stage 3 (moderate): Secondary | ICD-10-CM | POA: Diagnosis not present

## 2019-02-11 DIAGNOSIS — G4733 Obstructive sleep apnea (adult) (pediatric): Secondary | ICD-10-CM | POA: Diagnosis not present

## 2019-02-11 DIAGNOSIS — E7800 Pure hypercholesterolemia, unspecified: Secondary | ICD-10-CM | POA: Diagnosis not present

## 2019-02-11 DIAGNOSIS — I10 Essential (primary) hypertension: Secondary | ICD-10-CM | POA: Diagnosis not present

## 2019-03-06 DIAGNOSIS — E119 Type 2 diabetes mellitus without complications: Secondary | ICD-10-CM | POA: Diagnosis not present

## 2019-03-06 DIAGNOSIS — D443 Neoplasm of uncertain behavior of pituitary gland: Secondary | ICD-10-CM | POA: Diagnosis not present

## 2019-03-06 DIAGNOSIS — E039 Hypothyroidism, unspecified: Secondary | ICD-10-CM | POA: Diagnosis not present

## 2019-03-06 DIAGNOSIS — E890 Postprocedural hypothyroidism: Secondary | ICD-10-CM | POA: Diagnosis not present

## 2019-03-06 DIAGNOSIS — E118 Type 2 diabetes mellitus with unspecified complications: Secondary | ICD-10-CM | POA: Diagnosis not present

## 2019-03-06 DIAGNOSIS — C73 Malignant neoplasm of thyroid gland: Secondary | ICD-10-CM | POA: Diagnosis not present

## 2019-03-06 DIAGNOSIS — I10 Essential (primary) hypertension: Secondary | ICD-10-CM | POA: Diagnosis not present

## 2019-04-08 DIAGNOSIS — E790 Hyperuricemia without signs of inflammatory arthritis and tophaceous disease: Secondary | ICD-10-CM | POA: Diagnosis not present

## 2019-04-08 DIAGNOSIS — M179 Osteoarthritis of knee, unspecified: Secondary | ICD-10-CM | POA: Diagnosis not present

## 2019-04-08 DIAGNOSIS — M1990 Unspecified osteoarthritis, unspecified site: Secondary | ICD-10-CM | POA: Diagnosis not present

## 2019-04-08 DIAGNOSIS — R0602 Shortness of breath: Secondary | ICD-10-CM | POA: Diagnosis not present

## 2019-04-08 DIAGNOSIS — Z79899 Other long term (current) drug therapy: Secondary | ICD-10-CM | POA: Diagnosis not present

## 2019-04-08 DIAGNOSIS — M0589 Other rheumatoid arthritis with rheumatoid factor of multiple sites: Secondary | ICD-10-CM | POA: Diagnosis not present

## 2019-04-08 DIAGNOSIS — M25561 Pain in right knee: Secondary | ICD-10-CM | POA: Diagnosis not present

## 2019-04-09 DIAGNOSIS — L570 Actinic keratosis: Secondary | ICD-10-CM | POA: Diagnosis not present

## 2019-04-09 DIAGNOSIS — L821 Other seborrheic keratosis: Secondary | ICD-10-CM | POA: Diagnosis not present

## 2019-04-09 DIAGNOSIS — L918 Other hypertrophic disorders of the skin: Secondary | ICD-10-CM | POA: Diagnosis not present

## 2019-04-09 DIAGNOSIS — D1801 Hemangioma of skin and subcutaneous tissue: Secondary | ICD-10-CM | POA: Diagnosis not present

## 2019-04-09 DIAGNOSIS — Z85828 Personal history of other malignant neoplasm of skin: Secondary | ICD-10-CM | POA: Diagnosis not present

## 2019-04-09 DIAGNOSIS — D225 Melanocytic nevi of trunk: Secondary | ICD-10-CM | POA: Diagnosis not present

## 2019-05-07 DIAGNOSIS — E039 Hypothyroidism, unspecified: Secondary | ICD-10-CM | POA: Diagnosis not present

## 2019-05-07 DIAGNOSIS — D443 Neoplasm of uncertain behavior of pituitary gland: Secondary | ICD-10-CM | POA: Diagnosis not present

## 2019-05-07 DIAGNOSIS — I10 Essential (primary) hypertension: Secondary | ICD-10-CM | POA: Diagnosis not present

## 2019-05-07 DIAGNOSIS — C73 Malignant neoplasm of thyroid gland: Secondary | ICD-10-CM | POA: Diagnosis not present

## 2019-05-07 DIAGNOSIS — E7800 Pure hypercholesterolemia, unspecified: Secondary | ICD-10-CM | POA: Diagnosis not present

## 2019-05-07 DIAGNOSIS — E119 Type 2 diabetes mellitus without complications: Secondary | ICD-10-CM | POA: Diagnosis not present

## 2019-05-07 DIAGNOSIS — E118 Type 2 diabetes mellitus with unspecified complications: Secondary | ICD-10-CM | POA: Diagnosis not present

## 2019-05-14 DIAGNOSIS — E890 Postprocedural hypothyroidism: Secondary | ICD-10-CM | POA: Diagnosis not present

## 2019-05-14 DIAGNOSIS — M2550 Pain in unspecified joint: Secondary | ICD-10-CM | POA: Diagnosis not present

## 2019-05-14 DIAGNOSIS — M0589 Other rheumatoid arthritis with rheumatoid factor of multiple sites: Secondary | ICD-10-CM | POA: Diagnosis not present

## 2019-05-14 DIAGNOSIS — J4540 Moderate persistent asthma, uncomplicated: Secondary | ICD-10-CM | POA: Diagnosis not present

## 2019-05-14 DIAGNOSIS — E039 Hypothyroidism, unspecified: Secondary | ICD-10-CM | POA: Diagnosis not present

## 2019-05-14 DIAGNOSIS — G4733 Obstructive sleep apnea (adult) (pediatric): Secondary | ICD-10-CM | POA: Diagnosis not present

## 2019-05-14 DIAGNOSIS — E118 Type 2 diabetes mellitus with unspecified complications: Secondary | ICD-10-CM | POA: Diagnosis not present

## 2019-05-14 DIAGNOSIS — N183 Chronic kidney disease, stage 3 (moderate): Secondary | ICD-10-CM | POA: Diagnosis not present

## 2019-05-14 DIAGNOSIS — D443 Neoplasm of uncertain behavior of pituitary gland: Secondary | ICD-10-CM | POA: Diagnosis not present

## 2019-05-14 DIAGNOSIS — C73 Malignant neoplasm of thyroid gland: Secondary | ICD-10-CM | POA: Diagnosis not present

## 2019-05-14 DIAGNOSIS — E782 Mixed hyperlipidemia: Secondary | ICD-10-CM | POA: Diagnosis not present

## 2019-05-14 DIAGNOSIS — I10 Essential (primary) hypertension: Secondary | ICD-10-CM | POA: Diagnosis not present

## 2019-07-02 ENCOUNTER — Other Ambulatory Visit: Payer: Self-pay

## 2019-08-11 DIAGNOSIS — R0602 Shortness of breath: Secondary | ICD-10-CM | POA: Diagnosis not present

## 2019-08-11 DIAGNOSIS — M1990 Unspecified osteoarthritis, unspecified site: Secondary | ICD-10-CM | POA: Diagnosis not present

## 2019-08-11 DIAGNOSIS — M179 Osteoarthritis of knee, unspecified: Secondary | ICD-10-CM | POA: Diagnosis not present

## 2019-08-11 DIAGNOSIS — R739 Hyperglycemia, unspecified: Secondary | ICD-10-CM | POA: Diagnosis not present

## 2019-08-11 DIAGNOSIS — M0589 Other rheumatoid arthritis with rheumatoid factor of multiple sites: Secondary | ICD-10-CM | POA: Diagnosis not present

## 2019-08-11 DIAGNOSIS — I10 Essential (primary) hypertension: Secondary | ICD-10-CM | POA: Diagnosis not present

## 2019-08-11 DIAGNOSIS — Z79899 Other long term (current) drug therapy: Secondary | ICD-10-CM | POA: Diagnosis not present

## 2019-08-11 DIAGNOSIS — E785 Hyperlipidemia, unspecified: Secondary | ICD-10-CM | POA: Diagnosis not present

## 2019-08-11 DIAGNOSIS — E790 Hyperuricemia without signs of inflammatory arthritis and tophaceous disease: Secondary | ICD-10-CM | POA: Diagnosis not present

## 2019-08-11 DIAGNOSIS — M25561 Pain in right knee: Secondary | ICD-10-CM | POA: Diagnosis not present

## 2019-08-11 DIAGNOSIS — Z23 Encounter for immunization: Secondary | ICD-10-CM | POA: Diagnosis not present

## 2019-10-27 DIAGNOSIS — Z125 Encounter for screening for malignant neoplasm of prostate: Secondary | ICD-10-CM | POA: Diagnosis not present

## 2019-10-27 DIAGNOSIS — I10 Essential (primary) hypertension: Secondary | ICD-10-CM | POA: Diagnosis not present

## 2019-10-27 DIAGNOSIS — E118 Type 2 diabetes mellitus with unspecified complications: Secondary | ICD-10-CM | POA: Diagnosis not present

## 2019-10-27 DIAGNOSIS — E039 Hypothyroidism, unspecified: Secondary | ICD-10-CM | POA: Diagnosis not present

## 2019-11-03 DIAGNOSIS — I10 Essential (primary) hypertension: Secondary | ICD-10-CM | POA: Diagnosis not present

## 2019-11-03 DIAGNOSIS — N183 Chronic kidney disease, stage 3 unspecified: Secondary | ICD-10-CM | POA: Diagnosis not present

## 2019-11-03 DIAGNOSIS — E7800 Pure hypercholesterolemia, unspecified: Secondary | ICD-10-CM | POA: Diagnosis not present

## 2019-11-03 DIAGNOSIS — Z0000 Encounter for general adult medical examination without abnormal findings: Secondary | ICD-10-CM | POA: Diagnosis not present

## 2019-11-03 DIAGNOSIS — E118 Type 2 diabetes mellitus with unspecified complications: Secondary | ICD-10-CM | POA: Diagnosis not present

## 2019-11-03 DIAGNOSIS — E039 Hypothyroidism, unspecified: Secondary | ICD-10-CM | POA: Diagnosis not present

## 2019-11-04 DIAGNOSIS — C44321 Squamous cell carcinoma of skin of nose: Secondary | ICD-10-CM | POA: Diagnosis not present

## 2019-11-04 DIAGNOSIS — D485 Neoplasm of uncertain behavior of skin: Secondary | ICD-10-CM | POA: Diagnosis not present

## 2019-11-04 DIAGNOSIS — L304 Erythema intertrigo: Secondary | ICD-10-CM | POA: Diagnosis not present

## 2019-11-04 DIAGNOSIS — Z85828 Personal history of other malignant neoplasm of skin: Secondary | ICD-10-CM | POA: Diagnosis not present

## 2019-11-16 DIAGNOSIS — M0589 Other rheumatoid arthritis with rheumatoid factor of multiple sites: Secondary | ICD-10-CM | POA: Diagnosis not present

## 2019-11-18 DIAGNOSIS — I10 Essential (primary) hypertension: Secondary | ICD-10-CM | POA: Diagnosis not present

## 2019-11-18 DIAGNOSIS — E118 Type 2 diabetes mellitus with unspecified complications: Secondary | ICD-10-CM | POA: Diagnosis not present

## 2019-11-18 DIAGNOSIS — N183 Chronic kidney disease, stage 3 unspecified: Secondary | ICD-10-CM | POA: Diagnosis not present

## 2019-12-22 DIAGNOSIS — I10 Essential (primary) hypertension: Secondary | ICD-10-CM | POA: Diagnosis not present

## 2019-12-22 DIAGNOSIS — N400 Enlarged prostate without lower urinary tract symptoms: Secondary | ICD-10-CM | POA: Diagnosis not present

## 2019-12-22 DIAGNOSIS — E785 Hyperlipidemia, unspecified: Secondary | ICD-10-CM | POA: Diagnosis not present

## 2019-12-22 DIAGNOSIS — N183 Chronic kidney disease, stage 3 unspecified: Secondary | ICD-10-CM | POA: Diagnosis not present

## 2020-02-08 DIAGNOSIS — I10 Essential (primary) hypertension: Secondary | ICD-10-CM | POA: Diagnosis not present

## 2020-02-08 DIAGNOSIS — N400 Enlarged prostate without lower urinary tract symptoms: Secondary | ICD-10-CM | POA: Diagnosis not present

## 2020-02-08 DIAGNOSIS — T883XXD Malignant hyperthermia due to anesthesia, subsequent encounter: Secondary | ICD-10-CM | POA: Diagnosis not present

## 2020-02-08 DIAGNOSIS — I739 Peripheral vascular disease, unspecified: Secondary | ICD-10-CM | POA: Diagnosis not present

## 2020-02-23 DIAGNOSIS — Z79899 Other long term (current) drug therapy: Secondary | ICD-10-CM | POA: Diagnosis not present

## 2020-02-23 DIAGNOSIS — M1990 Unspecified osteoarthritis, unspecified site: Secondary | ICD-10-CM | POA: Diagnosis not present

## 2020-02-23 DIAGNOSIS — E790 Hyperuricemia without signs of inflammatory arthritis and tophaceous disease: Secondary | ICD-10-CM | POA: Diagnosis not present

## 2020-02-23 DIAGNOSIS — M179 Osteoarthritis of knee, unspecified: Secondary | ICD-10-CM | POA: Diagnosis not present

## 2020-02-23 DIAGNOSIS — M25561 Pain in right knee: Secondary | ICD-10-CM | POA: Diagnosis not present

## 2020-02-23 DIAGNOSIS — R0602 Shortness of breath: Secondary | ICD-10-CM | POA: Diagnosis not present

## 2020-02-23 DIAGNOSIS — M0589 Other rheumatoid arthritis with rheumatoid factor of multiple sites: Secondary | ICD-10-CM | POA: Diagnosis not present

## 2020-02-25 DIAGNOSIS — I10 Essential (primary) hypertension: Secondary | ICD-10-CM | POA: Diagnosis not present

## 2020-02-25 DIAGNOSIS — T883XXD Malignant hyperthermia due to anesthesia, subsequent encounter: Secondary | ICD-10-CM | POA: Diagnosis not present

## 2020-03-07 DIAGNOSIS — H04123 Dry eye syndrome of bilateral lacrimal glands: Secondary | ICD-10-CM | POA: Diagnosis not present

## 2020-03-07 DIAGNOSIS — G4730 Sleep apnea, unspecified: Secondary | ICD-10-CM | POA: Diagnosis not present

## 2020-03-07 DIAGNOSIS — E119 Type 2 diabetes mellitus without complications: Secondary | ICD-10-CM | POA: Diagnosis not present

## 2020-03-07 DIAGNOSIS — H2513 Age-related nuclear cataract, bilateral: Secondary | ICD-10-CM | POA: Diagnosis not present

## 2020-03-07 DIAGNOSIS — H10413 Chronic giant papillary conjunctivitis, bilateral: Secondary | ICD-10-CM | POA: Diagnosis not present

## 2020-03-07 DIAGNOSIS — H40053 Ocular hypertension, bilateral: Secondary | ICD-10-CM | POA: Diagnosis not present

## 2020-03-11 DIAGNOSIS — C73 Malignant neoplasm of thyroid gland: Secondary | ICD-10-CM | POA: Diagnosis not present

## 2020-03-11 DIAGNOSIS — D443 Neoplasm of uncertain behavior of pituitary gland: Secondary | ICD-10-CM | POA: Diagnosis not present

## 2020-03-11 DIAGNOSIS — E119 Type 2 diabetes mellitus without complications: Secondary | ICD-10-CM | POA: Diagnosis not present

## 2020-03-11 DIAGNOSIS — I10 Essential (primary) hypertension: Secondary | ICD-10-CM | POA: Diagnosis not present

## 2020-03-11 DIAGNOSIS — E890 Postprocedural hypothyroidism: Secondary | ICD-10-CM | POA: Diagnosis not present

## 2020-05-03 DIAGNOSIS — E039 Hypothyroidism, unspecified: Secondary | ICD-10-CM | POA: Diagnosis not present

## 2020-05-03 DIAGNOSIS — E7800 Pure hypercholesterolemia, unspecified: Secondary | ICD-10-CM | POA: Diagnosis not present

## 2020-05-03 DIAGNOSIS — I10 Essential (primary) hypertension: Secondary | ICD-10-CM | POA: Diagnosis not present

## 2020-06-07 DIAGNOSIS — K1321 Leukoplakia of oral mucosa, including tongue: Secondary | ICD-10-CM | POA: Diagnosis not present

## 2020-06-14 DIAGNOSIS — Z79899 Other long term (current) drug therapy: Secondary | ICD-10-CM | POA: Diagnosis not present

## 2020-06-14 DIAGNOSIS — M79641 Pain in right hand: Secondary | ICD-10-CM | POA: Diagnosis not present

## 2020-06-14 DIAGNOSIS — M179 Osteoarthritis of knee, unspecified: Secondary | ICD-10-CM | POA: Diagnosis not present

## 2020-06-14 DIAGNOSIS — M79642 Pain in left hand: Secondary | ICD-10-CM | POA: Diagnosis not present

## 2020-06-14 DIAGNOSIS — M0589 Other rheumatoid arthritis with rheumatoid factor of multiple sites: Secondary | ICD-10-CM | POA: Diagnosis not present

## 2020-06-14 DIAGNOSIS — M25561 Pain in right knee: Secondary | ICD-10-CM | POA: Diagnosis not present

## 2020-06-14 DIAGNOSIS — E790 Hyperuricemia without signs of inflammatory arthritis and tophaceous disease: Secondary | ICD-10-CM | POA: Diagnosis not present

## 2020-06-14 DIAGNOSIS — M1990 Unspecified osteoarthritis, unspecified site: Secondary | ICD-10-CM | POA: Diagnosis not present

## 2020-07-04 DIAGNOSIS — M7989 Other specified soft tissue disorders: Secondary | ICD-10-CM | POA: Diagnosis not present

## 2020-07-04 DIAGNOSIS — E039 Hypothyroidism, unspecified: Secondary | ICD-10-CM | POA: Diagnosis not present

## 2020-07-04 DIAGNOSIS — I10 Essential (primary) hypertension: Secondary | ICD-10-CM | POA: Diagnosis not present

## 2020-07-04 DIAGNOSIS — N184 Chronic kidney disease, stage 4 (severe): Secondary | ICD-10-CM | POA: Diagnosis not present

## 2020-07-07 ENCOUNTER — Ambulatory Visit: Payer: Medicare Other | Admitting: Cardiology

## 2020-07-07 ENCOUNTER — Encounter: Payer: Self-pay | Admitting: Cardiology

## 2020-07-07 ENCOUNTER — Other Ambulatory Visit: Payer: Self-pay

## 2020-07-07 VITALS — BP 155/73 | HR 54 | Resp 16 | Ht 69.0 in | Wt 301.2 lb

## 2020-07-07 DIAGNOSIS — N184 Chronic kidney disease, stage 4 (severe): Secondary | ICD-10-CM

## 2020-07-07 DIAGNOSIS — M7989 Other specified soft tissue disorders: Secondary | ICD-10-CM | POA: Diagnosis not present

## 2020-07-07 DIAGNOSIS — R0609 Other forms of dyspnea: Secondary | ICD-10-CM | POA: Diagnosis not present

## 2020-07-07 DIAGNOSIS — I120 Hypertensive chronic kidney disease with stage 5 chronic kidney disease or end stage renal disease: Secondary | ICD-10-CM | POA: Insufficient documentation

## 2020-07-07 DIAGNOSIS — M069 Rheumatoid arthritis, unspecified: Secondary | ICD-10-CM | POA: Diagnosis not present

## 2020-07-07 DIAGNOSIS — E1165 Type 2 diabetes mellitus with hyperglycemia: Secondary | ICD-10-CM

## 2020-07-07 DIAGNOSIS — N179 Acute kidney failure, unspecified: Secondary | ICD-10-CM

## 2020-07-07 DIAGNOSIS — E1122 Type 2 diabetes mellitus with diabetic chronic kidney disease: Secondary | ICD-10-CM

## 2020-07-07 DIAGNOSIS — Z794 Long term (current) use of insulin: Secondary | ICD-10-CM | POA: Diagnosis not present

## 2020-07-07 DIAGNOSIS — F17200 Nicotine dependence, unspecified, uncomplicated: Secondary | ICD-10-CM | POA: Diagnosis not present

## 2020-07-07 DIAGNOSIS — N189 Chronic kidney disease, unspecified: Secondary | ICD-10-CM

## 2020-07-07 DIAGNOSIS — E782 Mixed hyperlipidemia: Secondary | ICD-10-CM | POA: Diagnosis not present

## 2020-07-07 DIAGNOSIS — N185 Chronic kidney disease, stage 5: Secondary | ICD-10-CM | POA: Insufficient documentation

## 2020-07-07 DIAGNOSIS — Z6841 Body Mass Index (BMI) 40.0 and over, adult: Secondary | ICD-10-CM

## 2020-07-07 DIAGNOSIS — I10 Essential (primary) hypertension: Secondary | ICD-10-CM

## 2020-07-07 DIAGNOSIS — I129 Hypertensive chronic kidney disease with stage 1 through stage 4 chronic kidney disease, or unspecified chronic kidney disease: Secondary | ICD-10-CM | POA: Diagnosis not present

## 2020-07-07 MED ORDER — ISOSORBIDE DINITRATE 20 MG PO TABS: 20.0000 mg | ORAL_TABLET | Freq: Three times a day (TID) | ORAL | 0 refills | Status: DC

## 2020-07-07 MED ORDER — HYDRALAZINE HCL 25 MG PO TABS: 25.0000 mg | ORAL_TABLET | Freq: Three times a day (TID) | ORAL | 0 refills | Status: DC

## 2020-07-07 NOTE — Progress Notes (Signed)
[name]. Date of Birth: June 01, 1951 MRN: [id number] Primary Care Provider:Collins, Hinton Dyer, DO Former Cardiology Providers: Dr. Adrian Prows Primary Cardiologist: Rex Kras, DO, [facility] (established care 07/07/2020)  Date: 07/07/2020 Last Visit: 07/09/2018  Chief Complaint  Patient presents with  . Hypertension  . STAT follow up    Referred by Dr. Theda Sers    HPI  Dwayne Mull Brooke Bonito. is a 69 y.o.  male who presents to the office with a chief complaint of " follow-up for blood pressure." Patient's past medical history and cardiovascular risk factors include: Diabetes mellitus type 2, GERD, hyperlipidemia, hypertension, nonsecretory pituitary macroadenoma, rheumatoid arthritis, bronchitis, active tobacco use, advanced age, obesity due to excess calories.  Patient is referred to the office at the request of his primary care provider Dr. Theda Sers for evaluation and management of hypertension.  Patient was formally under the care of Dr. Adrian Prows and will be reestablishing care with myself as of today for his above-mentioned chief complaint.  Patient is here today accompanied by his wife [name].  Patient states that recently he had acute kidney injury and the medications were being changed.  As a result his blood pressures have been uncontrolled and has also noted lower extremity swelling.  Over the last 1 week they have noticed blood pressures his blood pressures being elevated.  They bring in blood pressure readings over the last 3 days which are 165/78, 198/87, 195/96, 175/86.  However, his office blood pressures usually are well controlled according the patient and his wife.  He denies any focal neurological deficits, no speech difficulty, no pain between the shoulder blades, no difficulty ambulating, and urine output at baseline.  Patient states that he was started on Lasix 80 mg p.o. twice daily and lost 13 pounds over the last 2 weeks.  His blood work was recently done at his PCP and Lasix  has been titrated down to 80 mg once a day.  In the recent past he has had an episode of chest discomfort which was relieved by belching and burping.  He denies chest pain at rest or with effort related activities.    FUNCTIONAL STATUS: No structured exercise program or daily routine.  ALLERGIES: Allergies  Allergen Reactions  . Atorvastatin Other (See Comments)    unknown  . Wilder Glade [Dapagliflozin] Other (See Comments)    Yeast infection   . Folic Acid Hives  . Humira [Adalimumab] Other (See Comments)    Caused a stroke   . Metformin And Related Other (See Comments)    Weak if takes 1000 mg  . Plavix [Clopidogrel Bisulfate] Hives  . Simvastatin Other (See Comments)    Couldn't think straight     MEDICATION LIST PRIOR TO VISIT: Current Outpatient Medications on File Prior to Visit  Medication Sig Dispense Refill  . albuterol (PROVENTIL HFA;VENTOLIN HFA) 108 (90 Base) MCG/ACT inhaler Inhale 1-2 puffs into the lungs every 6 (six) hours as needed for wheezing or shortness of breath.    Marland Kitchen aspirin EC 81 MG tablet Take 81 mg by mouth every evening.    . cyclobenzaprine (FLEXERIL) 10 MG tablet Take 10 mg by mouth 3 (three) times daily as needed for muscle spasms.    . diclofenac Sodium (VOLTAREN) 1 % GEL Apply 2 g topically 4 (four) times daily.    Marland Kitchen EPINEPHrine 0.3 mg/0.3 mL IJ SOAJ injection Inject 0.3 mLs (0.3 mg total) into the muscle as needed for up to 2 doses. 2 Device 0  . escitalopram (  LEXAPRO) 10 MG tablet Take 10 mg by mouth daily.    . furosemide (LASIX) 80 MG tablet Take 80 mg by mouth daily.    Marland Kitchen glimepiride (AMARYL) 4 MG tablet Take 4 mg by mouth daily with breakfast.     . leflunomide (ARAVA) 10 MG tablet Take 10 mg by mouth daily.    Marland Kitchen levothyroxine (SYNTHROID, LEVOTHROID) 200 MCG tablet Take 200 mcg by mouth See admin instructions. Monday-Saturday    . liraglutide (VICTOZA) 18 MG/3ML SOPN Inject 0.6 mg into the skin at bedtime.    Marland Kitchen losartan (COZAAR) 100 MG tablet Take  100 mg by mouth daily.     . metoprolol succinate (TOPROL-XL) 50 MG 24 hr tablet Take 50 mg by mouth at bedtime.    . pravastatin (PRAVACHOL) 40 MG tablet Take 40 mg by mouth every evening.    . predniSONE (DELTASONE) 5 MG tablet Take 5 mg by mouth daily.    . Tofacitinib Citrate (XELJANZ) 5 MG TABS Take 5 mg by mouth 2 (two) times daily.     No current facility-administered medications on file prior to visit.    PAST MEDICAL HISTORY: Past Medical History:  Diagnosis Date  . Asthma   . Chronic kidney disease   . Diabetes mellitus without complication ([place])   . Hyperlipidemia   . Hypertension   . Obesity   . Pituitary macroadenoma ([place])   . Rheumatoid arthritis ([place])   . Sleep apnea     PAST SURGICAL HISTORY: Past Surgical History:  Procedure Laterality Date  . Arthroscopic knee surgery Right 1998  . Gamma knife surgery  2011  . HAND SURGERY    . THYROIDECTOMY    . TONSILLECTOMY    . TUMOR REMOVAL     Pituitary    FAMILY HISTORY: The patient's family history includes Emphysema in his father; Healthy in his brother, sister, and sister; Hypertension in his mother.   SOCIAL HISTORY:  The patient  reports that he has been smoking. He has a 40.00 pack-year smoking history. He has never used smokeless tobacco. He reports current alcohol use. He reports previous drug use. Drug: Marijuana.  Review of Systems  Constitutional: Negative for chills and fever.  HENT: Negative for hoarse voice and nosebleeds.   Eyes: Negative for discharge, double vision and pain.  Cardiovascular: Positive for chest pain (see above), dyspnea on exertion and leg swelling (improving). Negative for claudication, near-syncope, orthopnea, palpitations, paroxysmal nocturnal dyspnea and syncope.  Respiratory: Negative for hemoptysis and shortness of breath.   Musculoskeletal: Negative for muscle cramps and myalgias.  Gastrointestinal: Negative for abdominal pain, constipation, diarrhea, hematemesis,  hematochezia, melena, nausea and vomiting.  Neurological: Positive for light-headedness (usually with low blood sugars.). Negative for dizziness.    PHYSICAL EXAM: Vitals with BMI 07/07/2020 08/06/2018 08/05/2018  Height 5' 9" - -  Weight 301 lbs 3 oz - -  BMI 17.35 - -  Systolic 670 141 030  Diastolic 73 61 55  Pulse 54 72 71   CONSTITUTIONAL: Well-developed and well-nourished. No acute distress.  SKIN: Skin is warm and dry. No rash noted. No cyanosis. No pallor. No jaundice HEAD: Normocephalic and atraumatic.  EYES: No scleral icterus MOUTH/THROAT: Moist oral membranes.  NECK: No JVD present. No thyromegaly noted. No carotid bruits  LYMPHATIC: No visible cervical adenopathy.  CHEST Normal respiratory effort. No intercostal retractions  LUNGS: Decreased breath sounds bilaterally.  No stridor. No wheezes. No rales.  CARDIOVASCULAR: Regular positives S1-S2, no gallops rubs or murmurs  appreciated ABDOMINAL: Obese soft, nontender, nondistended, positive bowel sounds all 4 quadrants, no apparent ascites.  EXTREMITIES: 2+ bilateral pitting peripheral edema  HEMATOLOGIC: No significant bruising NEUROLOGIC: Oriented to person, place, and time. Nonfocal. Normal muscle tone.  PSYCHIATRIC: Normal mood and affect. Normal behavior. Cooperative  CARDIAC DATABASE: EKG: None  Echocardiogram: 11/13/2012: LVEF 67%, abnormal relaxation, mild LVH, left atrium slightly dilated, trace MR.  Stress Testing:  January 2014: Myocardial perfusion imaging: Perfusion images reveal a moderate sized inferior and apical nontransmural scar without superimposed ischemia.  LVEF 61%.  Low risk study clinical correlation recommended in a patient who weighs 260 pounds and a BMI of 46.  Heart Catheterization: None  Carotid duplex: 06/22/2018: Minimal stenosis of the left external carotid artery (less than 50%) bilateral antegrade vertebral artery flow.  Lower extremity arterial duplex: 06/22/2018: Biphasic waveform  in the right proximal SFA and below suggest diffuse disease no hemodynamically significant stenosis identified in the left lower extremity.  ABI mildly reduced in the right lower extremity.  Normal ABI suggestive of normal perfusion in the left lower extremity.  LABORATORY DATA: External Labs: Collected: 07/04/2020 Creatinine 2.9 mg/dL. eGFR: 23 mL/min per 1.73 m Sodium 146, BUN 36, BUN to creatinine ratio 12.2 TSH: 0.59   Lipid profile: Collected: 05/03/2020 Total cholesterol 181, triglycerides 285, HDL 57, LDL 67, non-HDL 124  IMPRESSION:    ICD-10-CM   1. Benign hypertension  I10 hydrALAZINE (APRESOLINE) 25 MG tablet    isosorbide dinitrate (ISORDIL) 20 MG tablet    Basic metabolic panel    Magnesium    Pro b natriuretic peptide (BNP)  2. Acute kidney injury superimposed on chronic kidney disease (HCC)  N17.9    N18.9   3. Leg swelling  M79.89   4. Dyspnea on exertion  R06.00   5. Type 2 diabetes mellitus with hyperglycemia, with long-term current use of insulin (HCC)  E11.65    Z79.4   6. Type 2 diabetes mellitus with stage 4 chronic kidney disease, with long-term current use of insulin (HCC)  E11.22    N18.4    Z79.4   7. Mixed hyperlipidemia  E78.2   8. Rheumatoid arthritis  M06.9   9. Smoking  F17.200   10. Class 3 severe obesity due to excess calories with serious comorbidity and body mass index (BMI) of 40.0 to 44.9 in adult [facility])  E66.01    Z68.41      RECOMMENDATIONS: Dwayne Isham. is a 69 y.o. male whose past medical history and cardiovascular risk factors include: Diabetes mellitus type 2, GERD, hyperlipidemia, hypertension, nonsecretory pituitary macroadenoma, rheumatoid arthritis, bronchitis, active tobacco use, advanced age, obesity due to excess calories.  Benign essential hypertension:  Patient states that before his medications were titrated due to his kidney function and his blood pressure was very well controlled.  However, since then he has been  having elevated blood pressure readings and lower extremity swelling.  He has been diuresed with Lasix as prescribed by his primary care provider according to the patient.  I did not have blood work to review at today's office visit.  But did contact the primary team and obtained the most recent creatinine which was 2.9 and it appears that his baseline creatinine usually between 2.2-2.4.  We will add Hydralazine 25 mg p.o. 3 times daily.  And Isordil 20 mg p.o. 3 times daily.  Patient understands that he should not be taking erectile dysfunction medication such as Viagra/sildenafil, Cialis/tadalafil, Levitra/vardenafil while on nitrates as there  are drug to drug interactions which may contribute to morbidity and mortality.  Both patient and wife verbalized understanding.  I will have the patient enrolled into principal care management for ambulatory blood pressure monitoring.  He will have to come back to the office to obtain a blood pressure cuff so that his blood pressures can be monitored closely.  Would like to see him back in 1 week for blood pressure check and labs prior to that.  Lower extremity swelling:  Patient states that he has been diuresed approximately 13 pounds in the last 2 weeks.  Given his most recent kidney function his Lasix was transitioned to 80 mg p.o. daily.  Check lower extremity duplex to rule out DVT as he is also taking leflunomide.  Once his blood pressure is better controlled would recommend an ischemic evaluation with echo and a stress test.  Hyperlipidemia: Continue statin therapy.  Managed per primary team  Tobacco use: Educated on importance of complete smoking cessation.  Patient does not appear to be motivated to stop smoking.  Patient is asked to call the office if his systolic blood pressures continue to be more than 140 mmHg.  If his systolic blood pressures are consistently greater than 170-180 mmHg he is asked to seek medical attention at the closest  ER via EMS.  He is also educated on symptoms of stroke and aortic dissection as these can be complications of uncontrolled hypertension.  Both patient and wife verbalized understanding.   FINAL MEDICATION LIST END OF ENCOUNTER: Meds ordered this encounter  Medications  . hydrALAZINE (APRESOLINE) 25 MG tablet    Sig: Take 1 tablet (25 mg total) by mouth 3 (three) times daily.    Dispense:  90 tablet    Refill:  0  . isosorbide dinitrate (ISORDIL) 20 MG tablet    Sig: Take 1 tablet (20 mg total) by mouth 3 (three) times daily.    Dispense:  90 tablet    Refill:  0      Current Outpatient Medications:  .  albuterol (PROVENTIL HFA;VENTOLIN HFA) 108 (90 Base) MCG/ACT inhaler, Inhale 1-2 puffs into the lungs every 6 (six) hours as needed for wheezing or shortness of breath., Disp: , Rfl:  .  aspirin EC 81 MG tablet, Take 81 mg by mouth every evening., Disp: , Rfl:  .  cyclobenzaprine (FLEXERIL) 10 MG tablet, Take 10 mg by mouth 3 (three) times daily as needed for muscle spasms., Disp: , Rfl:  .  diclofenac Sodium (VOLTAREN) 1 % GEL, Apply 2 g topically 4 (four) times daily., Disp: , Rfl:  .  EPINEPHrine 0.3 mg/0.3 mL IJ SOAJ injection, Inject 0.3 mLs (0.3 mg total) into the muscle as needed for up to 2 doses., Disp: 2 Device, Rfl: 0 .  escitalopram (LEXAPRO) 10 MG tablet, Take 10 mg by mouth daily., Disp: , Rfl:  .  furosemide (LASIX) 80 MG tablet, Take 80 mg by mouth daily., Disp: , Rfl:  .  glimepiride (AMARYL) 4 MG tablet, Take 4 mg by mouth daily with breakfast. , Disp: , Rfl:  .  leflunomide (ARAVA) 10 MG tablet, Take 10 mg by mouth daily., Disp: , Rfl:  .  levothyroxine (SYNTHROID, LEVOTHROID) 200 MCG tablet, Take 200 mcg by mouth See admin instructions. Monday-Saturday, Disp: , Rfl:  .  liraglutide (VICTOZA) 18 MG/3ML SOPN, Inject 0.6 mg into the skin at bedtime., Disp: , Rfl:  .  losartan (COZAAR) 100 MG tablet, Take 100 mg by mouth daily. ,  Disp: , Rfl:  .  metoprolol succinate  (TOPROL-XL) 50 MG 24 hr tablet, Take 50 mg by mouth at bedtime., Disp: , Rfl:  .  pravastatin (PRAVACHOL) 40 MG tablet, Take 40 mg by mouth every evening., Disp: , Rfl:  .  predniSONE (DELTASONE) 5 MG tablet, Take 5 mg by mouth daily., Disp: , Rfl:  .  Tofacitinib Citrate (XELJANZ) 5 MG TABS, Take 5 mg by mouth 2 (two) times daily., Disp: , Rfl:  .  hydrALAZINE (APRESOLINE) 25 MG tablet, Take 1 tablet (25 mg total) by mouth 3 (three) times daily., Disp: 90 tablet, Rfl: 0 .  isosorbide dinitrate (ISORDIL) 20 MG tablet, Take 1 tablet (20 mg total) by mouth 3 (three) times daily., Disp: 90 tablet, Rfl: 0  Orders Placed This Encounter  Procedures  . Basic metabolic panel  . Magnesium  . Pro b natriuretic peptide (BNP)   Total time spent 47 minutes of which greater than 50% of time was spent face-to-face discussing medical condition and counseling.  Reviewed prior office notes, echo report, stress test report, arterial duplex, carotid duplex results, obtain the labs from PCPs office during the office visit, coordinating care and complex medical decision making regarding the patient's care.   --Continue cardiac medications as reconciled in final medication list. --Return in about 1 week (around 07/14/2020) for BP follow up.. Or sooner if needed. --Continue follow-up with your primary care physician regarding the management of your other chronic comorbid conditions.  Patient's questions and concerns were addressed to his satisfaction. He voices understanding of the instructions provided during this encounter.   During this visit I reviewed and updated: Tobacco history  allergies medication reconciliation  medical history  surgical history  family history  social history.  This note was created using a voice recognition software as a result there may be grammatical errors inadvertently enclosed that do not reflect the nature of this encounter. Every attempt is made to correct such  errors.  Rex Kras, [place], [facility]  Pager: [phone] Office: [phone]

## 2020-07-08 ENCOUNTER — Other Ambulatory Visit: Payer: Self-pay

## 2020-07-08 ENCOUNTER — Ambulatory Visit (HOSPITAL_COMMUNITY)
Admission: RE | Admit: 2020-07-08 | Discharge: 2020-07-08 | Disposition: A | Discharge status: Home / Self Care | Payer: Medicare Other | Source: Ambulatory Visit | Attending: Cardiology | Admitting: Cardiology

## 2020-07-08 ENCOUNTER — Other Ambulatory Visit (HOSPITAL_COMMUNITY): Payer: Self-pay | Admitting: Cardiology

## 2020-07-08 ENCOUNTER — Telehealth: Payer: Self-pay

## 2020-07-08 ENCOUNTER — Other Ambulatory Visit: Payer: Self-pay | Admitting: Cardiology

## 2020-07-08 DIAGNOSIS — R0989 Other specified symptoms and signs involving the circulatory and respiratory systems: Secondary | ICD-10-CM

## 2020-07-08 DIAGNOSIS — M7989 Other specified soft tissue disorders: Secondary | ICD-10-CM | POA: Insufficient documentation

## 2020-07-08 NOTE — Telephone Encounter (Signed)
It was LE duplex. Thanks for the update.

## 2020-07-08 NOTE — Telephone Encounter (Signed)
Cardiac clinic called to inform us that pt [name] and NUC was negative

## 2020-07-08 NOTE — Progress Notes (Signed)
Lower extremity venous bilateral study completed.   See Cv Proc for preliminary results.   [name]  

## 2020-07-08 NOTE — Progress Notes (Signed)
External Labs: Collected: 07/04/2020 Creatinine 2.9 mg/dL. eGFR: 23 mL/min per 1.73 m Sodium 146, BUN 36, BUN to creatinine ratio 12.2 TSH: 0.59   Lipid profile: Collected: 05/03/2020 Total cholesterol 181, triglycerides 285, HDL 57, LDL 67, non-HDL 124

## 2020-07-10 ENCOUNTER — Encounter: Payer: Self-pay | Admitting: Cardiology

## 2020-07-10 ENCOUNTER — Other Ambulatory Visit: Payer: Self-pay

## 2020-07-10 ENCOUNTER — Observation Stay (HOSPITAL_COMMUNITY)
Admission: EM | Admit: 2020-07-10 | Discharge: 2020-07-12 | Disposition: A | Discharge status: Home / Self Care | Payer: Medicare Other | Attending: Internal Medicine | Admitting: Internal Medicine

## 2020-07-10 ENCOUNTER — Encounter (HOSPITAL_COMMUNITY): Payer: Self-pay

## 2020-07-10 ENCOUNTER — Emergency Department (HOSPITAL_COMMUNITY): Payer: Medicare Other

## 2020-07-10 DIAGNOSIS — Z7984 Long term (current) use of oral hypoglycemic drugs: Secondary | ICD-10-CM | POA: Insufficient documentation

## 2020-07-10 DIAGNOSIS — Z79899 Other long term (current) drug therapy: Secondary | ICD-10-CM | POA: Diagnosis not present

## 2020-07-10 DIAGNOSIS — F17210 Nicotine dependence, cigarettes, uncomplicated: Secondary | ICD-10-CM | POA: Diagnosis not present

## 2020-07-10 DIAGNOSIS — E039 Hypothyroidism, unspecified: Secondary | ICD-10-CM | POA: Diagnosis not present

## 2020-07-10 DIAGNOSIS — J45909 Unspecified asthma, uncomplicated: Secondary | ICD-10-CM | POA: Diagnosis not present

## 2020-07-10 DIAGNOSIS — I169 Hypertensive crisis, unspecified: Secondary | ICD-10-CM | POA: Insufficient documentation

## 2020-07-10 DIAGNOSIS — E1122 Type 2 diabetes mellitus with diabetic chronic kidney disease: Secondary | ICD-10-CM | POA: Diagnosis not present

## 2020-07-10 DIAGNOSIS — I160 Hypertensive urgency: Principal | ICD-10-CM | POA: Diagnosis present

## 2020-07-10 DIAGNOSIS — Z6841 Body Mass Index (BMI) 40.0 and over, adult: Secondary | ICD-10-CM | POA: Diagnosis not present

## 2020-07-10 DIAGNOSIS — Z86018 Personal history of other benign neoplasm: Secondary | ICD-10-CM | POA: Diagnosis not present

## 2020-07-10 DIAGNOSIS — G4733 Obstructive sleep apnea (adult) (pediatric): Secondary | ICD-10-CM | POA: Diagnosis not present

## 2020-07-10 DIAGNOSIS — N184 Chronic kidney disease, stage 4 (severe): Secondary | ICD-10-CM | POA: Diagnosis not present

## 2020-07-10 DIAGNOSIS — I6782 Cerebral ischemia: Secondary | ICD-10-CM | POA: Diagnosis not present

## 2020-07-10 DIAGNOSIS — M069 Rheumatoid arthritis, unspecified: Secondary | ICD-10-CM | POA: Diagnosis present

## 2020-07-10 DIAGNOSIS — R030 Elevated blood-pressure reading, without diagnosis of hypertension: Secondary | ICD-10-CM | POA: Diagnosis present

## 2020-07-10 DIAGNOSIS — I129 Hypertensive chronic kidney disease with stage 1 through stage 4 chronic kidney disease, or unspecified chronic kidney disease: Secondary | ICD-10-CM | POA: Diagnosis not present

## 2020-07-10 DIAGNOSIS — Z7982 Long term (current) use of aspirin: Secondary | ICD-10-CM | POA: Insufficient documentation

## 2020-07-10 DIAGNOSIS — F17200 Nicotine dependence, unspecified, uncomplicated: Secondary | ICD-10-CM | POA: Insufficient documentation

## 2020-07-10 DIAGNOSIS — E785 Hyperlipidemia, unspecified: Secondary | ICD-10-CM | POA: Diagnosis not present

## 2020-07-10 DIAGNOSIS — Z20822 Contact with and (suspected) exposure to covid-19: Secondary | ICD-10-CM | POA: Diagnosis not present

## 2020-07-10 DIAGNOSIS — Z9989 Dependence on other enabling machines and devices: Secondary | ICD-10-CM

## 2020-07-10 DIAGNOSIS — I10 Essential (primary) hypertension: Secondary | ICD-10-CM | POA: Diagnosis not present

## 2020-07-10 DIAGNOSIS — E119 Type 2 diabetes mellitus without complications: Secondary | ICD-10-CM

## 2020-07-10 LAB — CBC
HCT: 40.8 % (ref 39.0–52.0)
Hemoglobin: 13 g/dL (ref 13.0–17.0)
MCH: 29.3 pg (ref 26.0–34.0)
MCHC: 31.9 g/dL (ref 30.0–36.0)
MCV: 92.1 fL (ref 80.0–100.0)
Platelets: 279 10*3/uL (ref 150–400)
RBC: 4.43 MIL/uL (ref 4.22–5.81)
RDW: 14.1 % (ref 11.5–15.5)
WBC: 8.1 10*3/uL (ref 4.0–10.5)
nRBC: 0 % (ref 0.0–0.2)

## 2020-07-10 LAB — BASIC METABOLIC PANEL
Anion gap: 11 (ref 5–15)
BUN: 33 mg/dL — ABNORMAL HIGH (ref 8–23)
CO2: 27 mmol/L (ref 22–32)
Calcium: 9.7 mg/dL (ref 8.9–10.3)
Chloride: 102 mmol/L (ref 98–111)
Creatinine, Ser: 2.71 mg/dL — ABNORMAL HIGH (ref 0.61–1.24)
GFR calc Af Amer: 27 mL/min — ABNORMAL LOW (ref >60)
GFR calc non Af Amer: 23 mL/min — ABNORMAL LOW (ref >60)
Glucose, Bld: 75 mg/dL (ref 70–99)
Potassium: 3.8 mmol/L (ref 3.5–5.1)
Sodium: 140 mmol/L (ref 135–145)

## 2020-07-10 LAB — TROPONIN I (HIGH SENSITIVITY): Troponin I (High Sensitivity): 33 ng/L — ABNORMAL HIGH (ref <18)

## 2020-07-10 MED ORDER — SODIUM CHLORIDE 0.9% FLUSH: 3.0000 mL | Freq: Once | INTRAVENOUS | Status: DC

## 2020-07-10 NOTE — ED Triage Notes (Signed)
Pt presents to ED with HTN. And headache. He reports cardiology added 2 additional medications last week, but SBP has intermittently been >200. He states MD instructed him to come to ED when this occurs. Pt denies CP or SOB

## 2020-07-11 ENCOUNTER — Observation Stay (HOSPITAL_COMMUNITY): Payer: Medicare Other

## 2020-07-11 DIAGNOSIS — I169 Hypertensive crisis, unspecified: Secondary | ICD-10-CM | POA: Diagnosis not present

## 2020-07-11 DIAGNOSIS — I160 Hypertensive urgency: Secondary | ICD-10-CM | POA: Diagnosis not present

## 2020-07-11 DIAGNOSIS — G4733 Obstructive sleep apnea (adult) (pediatric): Secondary | ICD-10-CM

## 2020-07-11 DIAGNOSIS — I6782 Cerebral ischemia: Secondary | ICD-10-CM | POA: Diagnosis not present

## 2020-07-11 DIAGNOSIS — G319 Degenerative disease of nervous system, unspecified: Secondary | ICD-10-CM | POA: Diagnosis not present

## 2020-07-11 DIAGNOSIS — M069 Rheumatoid arthritis, unspecified: Secondary | ICD-10-CM | POA: Diagnosis present

## 2020-07-11 DIAGNOSIS — N184 Chronic kidney disease, stage 4 (severe): Secondary | ICD-10-CM | POA: Diagnosis present

## 2020-07-11 DIAGNOSIS — E785 Hyperlipidemia, unspecified: Secondary | ICD-10-CM | POA: Diagnosis present

## 2020-07-11 DIAGNOSIS — E119 Type 2 diabetes mellitus without complications: Secondary | ICD-10-CM

## 2020-07-11 DIAGNOSIS — G932 Benign intracranial hypertension: Secondary | ICD-10-CM | POA: Diagnosis not present

## 2020-07-11 LAB — HEMOGLOBIN A1C
Hgb A1c MFr Bld: 6.6 % — ABNORMAL HIGH (ref 4.8–5.6)
Mean Plasma Glucose: 142.72 mg/dL

## 2020-07-11 LAB — CBG MONITORING, ED
Glucose-Capillary: 34 mg/dL — CL (ref 70–99)
Glucose-Capillary: 149 mg/dL — ABNORMAL HIGH (ref 70–99)
Glucose-Capillary: 151 mg/dL — ABNORMAL HIGH (ref 70–99)

## 2020-07-11 LAB — TROPONIN I (HIGH SENSITIVITY)
Troponin I (High Sensitivity): 39 ng/L — ABNORMAL HIGH (ref <18)
Troponin I (High Sensitivity): 42 ng/L — ABNORMAL HIGH (ref <18)

## 2020-07-11 LAB — GLUCOSE, CAPILLARY
Glucose-Capillary: 145 mg/dL — ABNORMAL HIGH (ref 70–99)
Glucose-Capillary: 229 mg/dL — ABNORMAL HIGH (ref 70–99)
Glucose-Capillary: 142 mg/dL — ABNORMAL HIGH (ref 70–99)

## 2020-07-11 LAB — SARS CORONAVIRUS 2 BY RT PCR (HOSPITAL ORDER, PERFORMED IN ~~LOC~~ HOSPITAL LAB): SARS Coronavirus 2: NEGATIVE

## 2020-07-11 LAB — HIV ANTIBODY (ROUTINE TESTING W REFLEX): HIV Screen 4th Generation wRfx: NONREACTIVE

## 2020-07-11 LAB — TSH: TSH: 0.616 u[IU]/mL (ref 0.350–4.500)

## 2020-07-11 MED ORDER — HYDROCODONE-ACETAMINOPHEN 5-325 MG PO TABS: 1.0000 | ORAL_TABLET | ORAL | Status: DC | PRN

## 2020-07-11 MED ORDER — HYDRALAZINE HCL 20 MG/ML IJ SOLN
10.0000 mg | Freq: Once | INTRAMUSCULAR | Status: AC
  Administered 2020-07-11: 10 mg via INTRAVENOUS
  Filled 2020-07-11: qty 1

## 2020-07-11 MED ORDER — BISACODYL 5 MG PO TBEC: 5.0000 mg | DELAYED_RELEASE_TABLET | Freq: Every day | ORAL | Status: DC | PRN

## 2020-07-11 MED ORDER — ENOXAPARIN SODIUM 40 MG/0.4ML ~~LOC~~ SOLN
40.0000 mg | SUBCUTANEOUS | Status: DC
  Administered 2020-07-11 – 2020-07-12 (×2): 40 mg via SUBCUTANEOUS
  Filled 2020-07-11 (×2): qty 0.4

## 2020-07-11 MED ORDER — FUROSEMIDE 80 MG PO TABS
80.0000 mg | ORAL_TABLET | Freq: Two times a day (BID) | ORAL | Status: DC
  Administered 2020-07-11 – 2020-07-12 (×2): 80 mg via ORAL
  Filled 2020-07-11 (×2): qty 1

## 2020-07-11 MED ORDER — PRAVASTATIN SODIUM 40 MG PO TABS
40.0000 mg | ORAL_TABLET | Freq: Every evening | ORAL | Status: DC
  Administered 2020-07-11: 40 mg via ORAL
  Filled 2020-07-11: qty 1

## 2020-07-11 MED ORDER — TOFACITINIB CITRATE 5 MG PO TABS: 5.0000 mg | ORAL_TABLET | Freq: Two times a day (BID) | ORAL | Status: DC

## 2020-07-11 MED ORDER — MORPHINE SULFATE (PF) 2 MG/ML IV SOLN: 2.0000 mg | INTRAVENOUS | Status: DC | PRN

## 2020-07-11 MED ORDER — ACETAMINOPHEN 650 MG RE SUPP: 650.0000 mg | Freq: Four times a day (QID) | RECTAL | Status: DC | PRN

## 2020-07-11 MED ORDER — ISOSORBIDE DINITRATE 20 MG PO TABS
20.0000 mg | ORAL_TABLET | Freq: Three times a day (TID) | ORAL | Status: DC
  Administered 2020-07-11 – 2020-07-12 (×4): 20 mg via ORAL
  Filled 2020-07-11 (×6): qty 1

## 2020-07-11 MED ORDER — DOCUSATE SODIUM 100 MG PO CAPS
100.0000 mg | ORAL_CAPSULE | Freq: Two times a day (BID) | ORAL | Status: DC
  Administered 2020-07-11: 100 mg via ORAL
  Filled 2020-07-11 (×3): qty 1

## 2020-07-11 MED ORDER — ACETAMINOPHEN 500 MG PO TABS
1000.0000 mg | ORAL_TABLET | Freq: Once | ORAL | Status: AC
  Administered 2020-07-11: 1000 mg via ORAL
  Filled 2020-07-11: qty 2

## 2020-07-11 MED ORDER — ONDANSETRON HCL 4 MG/2ML IJ SOLN: 4.0000 mg | Freq: Four times a day (QID) | INTRAMUSCULAR | Status: DC | PRN

## 2020-07-11 MED ORDER — CYCLOBENZAPRINE HCL 10 MG PO TABS: 10.0000 mg | ORAL_TABLET | Freq: Every evening | ORAL | Status: DC | PRN

## 2020-07-11 MED ORDER — ZOLPIDEM TARTRATE 5 MG PO TABS: 5.0000 mg | ORAL_TABLET | Freq: Every evening | ORAL | Status: DC | PRN

## 2020-07-11 MED ORDER — POLYETHYLENE GLYCOL 3350 17 G PO PACK: 17.0000 g | PACK | Freq: Every day | ORAL | Status: DC | PRN

## 2020-07-11 MED ORDER — ASPIRIN EC 81 MG PO TBEC
81.0000 mg | DELAYED_RELEASE_TABLET | Freq: Every evening | ORAL | Status: DC
  Administered 2020-07-11: 81 mg via ORAL
  Filled 2020-07-11: qty 1

## 2020-07-11 MED ORDER — SODIUM CHLORIDE 0.9% FLUSH
3.0000 mL | Freq: Two times a day (BID) | INTRAVENOUS | Status: DC
  Administered 2020-07-11 – 2020-07-12 (×3): 3 mL via INTRAVENOUS

## 2020-07-11 MED ORDER — LEFLUNOMIDE 20 MG PO TABS
10.0000 mg | ORAL_TABLET | Freq: Every day | ORAL | Status: DC
  Administered 2020-07-11 – 2020-07-12 (×2): 10 mg via ORAL
  Filled 2020-07-11 (×2): qty 0.5

## 2020-07-11 MED ORDER — INSULIN ASPART 100 UNIT/ML ~~LOC~~ SOLN
0.0000 [IU] | Freq: Three times a day (TID) | SUBCUTANEOUS | Status: DC
  Administered 2020-07-11 – 2020-07-12 (×2): 5 [IU] via SUBCUTANEOUS

## 2020-07-11 MED ORDER — ACETAMINOPHEN 325 MG PO TABS
650.0000 mg | ORAL_TABLET | Freq: Four times a day (QID) | ORAL | Status: DC | PRN
  Administered 2020-07-11 – 2020-07-12 (×2): 650 mg via ORAL
  Filled 2020-07-11 (×2): qty 2

## 2020-07-11 MED ORDER — HYDRALAZINE HCL 50 MG PO TABS
50.0000 mg | ORAL_TABLET | Freq: Three times a day (TID) | ORAL | Status: DC
  Administered 2020-07-11 (×3): 50 mg via ORAL
  Filled 2020-07-11: qty 2
  Filled 2020-07-11 (×2): qty 1

## 2020-07-11 MED ORDER — ALBUTEROL SULFATE (2.5 MG/3ML) 0.083% IN NEBU: 3.0000 mL | INHALATION_SOLUTION | Freq: Four times a day (QID) | RESPIRATORY_TRACT | Status: DC | PRN

## 2020-07-11 MED ORDER — METOPROLOL SUCCINATE ER 50 MG PO TB24
50.0000 mg | ORAL_TABLET | Freq: Every day | ORAL | Status: DC
  Administered 2020-07-11: 50 mg via ORAL
  Filled 2020-07-11: qty 1

## 2020-07-11 MED ORDER — ESCITALOPRAM OXALATE 10 MG PO TABS
10.0000 mg | ORAL_TABLET | Freq: Every day | ORAL | Status: DC
  Administered 2020-07-11 – 2020-07-12 (×2): 10 mg via ORAL
  Filled 2020-07-11 (×2): qty 1

## 2020-07-11 MED ORDER — INSULIN ASPART 100 UNIT/ML ~~LOC~~ SOLN: 0.0000 [IU] | Freq: Every day | SUBCUTANEOUS | Status: DC

## 2020-07-11 MED ORDER — FUROSEMIDE 10 MG/ML IJ SOLN
40.0000 mg | Freq: Once | INTRAMUSCULAR | Status: AC
  Administered 2020-07-11: 40 mg via INTRAVENOUS
  Filled 2020-07-11: qty 4

## 2020-07-11 MED ORDER — FENTANYL CITRATE (PF) 100 MCG/2ML IJ SOLN
50.0000 ug | Freq: Once | INTRAMUSCULAR | Status: AC
  Administered 2020-07-11: 50 ug via INTRAVENOUS
  Filled 2020-07-11: qty 2

## 2020-07-11 MED ORDER — PREDNISONE 5 MG PO TABS
5.0000 mg | ORAL_TABLET | Freq: Every day | ORAL | Status: DC
  Administered 2020-07-11: 5 mg via ORAL
  Filled 2020-07-11 (×2): qty 1

## 2020-07-11 MED ORDER — LEVOTHYROXINE SODIUM 100 MCG PO TABS: 200.0000 ug | ORAL_TABLET | Freq: Every day | ORAL | Status: DC

## 2020-07-11 MED ORDER — HYDRALAZINE HCL 20 MG/ML IJ SOLN
5.0000 mg | INTRAMUSCULAR | Status: DC | PRN
  Administered 2020-07-11: 5 mg via INTRAVENOUS
  Filled 2020-07-11: qty 1

## 2020-07-11 MED ORDER — ONDANSETRON HCL 4 MG PO TABS: 4.0000 mg | ORAL_TABLET | Freq: Four times a day (QID) | ORAL | Status: DC | PRN

## 2020-07-11 NOTE — Progress Notes (Signed)
Patient arrived to room 5C08 in NAD, VS stable and patient free from pain. Patient oriented to room.

## 2020-07-11 NOTE — Progress Notes (Signed)
Pt has home CPAP at bedside and was already wearing when RT came by for rounds. Advised pt to notify for RT if any further assistance is needed.

## 2020-07-11 NOTE — ED Provider Notes (Signed)
[facility] EMERGENCY DEPARTMENT Provider Note  CSN: [id number] Arrival date & time: 07/10/20 1821  Chief Complaint(s) No chief complaint on file.  HPI Dwayne Fraticelli. is a 69 y.o. male with past medical history listed below who presents to the emergency department with elevated blood pressures.  Patient has been noting his systolic blood pressures ranging from 180s to 200s.  Is been ongoing for the past 1 week.  Patient saw his primary care provider and cardiologist for this.  In the visit with the cardiologist patient's blood pressure was noted to be in the 150s.  Recent labs revealed patient had renal insufficiency with a creatinine of 2.9.  Blood pressure medication was adjusted patient was placed back on hydralazine.  Also prescribed Imdur.  He reports that he has been compliant with his medications.   He endorses 3 days of mild gradual onset occipital headache.  No focal deficits.  No visual disturbance.  He denies any chest pain or shortness of breath.  He does report peripheral edema which is been ongoing for 3 weeks.  He presented only because his physicians instructed him to do so if his blood pressures were around 200.  HPI  Past Medical History Past Medical History:  Diagnosis Date   Asthma    Chronic kidney disease    Diabetes mellitus without complication ([place])    Hyperlipidemia    Hypertension    Obesity    Pituitary macroadenoma ([place])    Rheumatoid arthritis ([place])    Sleep apnea    Patient Active Problem List   Diagnosis Date Noted   OBESITY-MORBID (>100') 11/24/2008   OVERWEIGHT/OBESITY 11/24/2008   PALPITATIONS 11/24/2008   Home Medication(s) Prior to Admission medications   Medication Sig Start Date End Date Taking? Authorizing Provider  albuterol (PROVENTIL HFA;VENTOLIN HFA) 108 (90 Base) MCG/ACT inhaler Inhale 1-2 puffs into the lungs every 6 (six) hours as needed for wheezing or shortness of breath.   Yes [provider]  aspirin EC 81 MG tablet Take 81 mg by mouth every evening.   Yes [provider]  cyclobenzaprine (FLEXERIL) 10 MG tablet Take 10 mg by mouth at bedtime as needed for muscle spasms.    Yes [provider]  diclofenac Sodium (VOLTAREN) 1 % GEL Apply 2 g topically 4 (four) times daily.   Yes [provider]  EPINEPHrine 0.3 mg/0.3 mL IJ SOAJ injection Inject 0.3 mLs (0.3 mg total) into the muscle as needed for up to 2 doses. 08/06/18  Yes Irven Baltimore, MD  escitalopram (LEXAPRO) 10 MG tablet Take 10 mg by mouth daily.   Yes [provider]  furosemide (LASIX) 80 MG tablet Take 80 mg by mouth 2 (two) times daily.  06/18/20  Yes [provider]  glimepiride (AMARYL) 4 MG tablet Take 4 mg by mouth daily with breakfast.    Yes [provider]  hydrALAZINE (APRESOLINE) 25 MG tablet Take 1 tablet (25 mg total) by mouth 3 (three) times daily. 07/07/20 08/06/20 Yes Tolia, Sunit, DO  isosorbide dinitrate (ISORDIL) 20 MG tablet Take 1 tablet (20 mg total) by mouth 3 (three) times daily. 07/07/20 08/06/20 Yes Tolia, Sunit, DO  leflunomide (ARAVA) 10 MG tablet Take 10 mg by mouth daily. 06/25/20  Yes [provider]  levothyroxine (SYNTHROID, LEVOTHROID) 200 MCG tablet Take 200 mcg by mouth daily before breakfast.    Yes [provider]  liraglutide (VICTOZA) 18 MG/3ML SOPN Inject 0.6 mg into the skin at  bedtime.   Yes [provider]  losartan (COZAAR) 100 MG tablet Take 100 mg by mouth daily.    Yes [provider]  metoprolol succinate (TOPROL-XL) 50 MG 24 hr tablet Take 50 mg by mouth at bedtime. 07/04/20  Yes [provider]  pravastatin (PRAVACHOL) 40 MG tablet Take 40 mg by mouth every evening.   Yes [provider]  predniSONE (DELTASONE) 5 MG tablet Take 5 mg by mouth daily. 06/02/20  Yes [provider]  Tofacitinib Citrate (XELJANZ) 5 MG TABS Take 5 mg by mouth 2 (two) times daily.    Yes [provider]                                                                                                                                    Past Surgical History Past Surgical History:  Procedure Laterality Date   Arthroscopic knee surgery Right 1998   Gamma knife surgery  2011   HAND SURGERY     THYROIDECTOMY     TONSILLECTOMY     TUMOR REMOVAL     Pituitary   Family History Family History  Problem Relation Age of Onset   Hypertension Mother    Emphysema Father    Healthy Sister    Healthy Brother    Healthy Sister     Social History Social History   Tobacco Use   Smoking status: Current Every Day Smoker    Packs/day: 1.00    Years: 40.00    Pack years: 40.00   Smokeless tobacco: Never Used  Scientific laboratory technician Use: Never used  Substance Use Topics   Alcohol use: Yes    Comment: occasionally   Drug use: Not Currently    Types: Marijuana   Allergies Atorvastatin, Farxiga [dapagliflozin], Folic acid, Humira [adalimumab], Metformin and related, Plavix [clopidogrel bisulfate], and Simvastatin  Review of Systems Review of Systems All other systems are reviewed and are negative for acute change except as noted in the HPI  Physical Exam Vital Signs  I have reviewed the triage vital signs BP (!) 205/81    Pulse (!) 55    Temp 98 F (36.7 C) (Oral)    Resp 18    Ht 5\' 9"  (1.753 m)    Wt 134.7 kg    SpO2 97%    BMI 43.86 kg/m   Physical Exam Vitals reviewed.  Constitutional:      General: He is not in acute distress.    Appearance: He is well-developed. He is obese. He is not diaphoretic.  HENT:     Head: Normocephalic and atraumatic.     Nose: Nose normal.  Eyes:     General: No scleral icterus.       Right eye: No discharge.        Left eye: No discharge.     Conjunctiva/sclera: Conjunctivae normal.     Pupils: Pupils are equal,  round, and reactive to light.  Cardiovascular:     Rate and Rhythm: Normal rate and regular  rhythm.     Heart sounds: No murmur heard.  No friction rub. No gallop.   Pulmonary:     Effort: Pulmonary effort is normal. No respiratory distress.     Breath sounds: Normal breath sounds. No stridor. No rales.  Abdominal:     General: There is no distension.     Palpations: Abdomen is soft.     Tenderness: There is no abdominal tenderness.  Musculoskeletal:        General: No tenderness.     Cervical back: Normal range of motion and neck supple.     Right lower leg: 2+ Pitting Edema present.     Left lower leg: 2+ Pitting Edema present.  Skin:    General: Skin is warm and dry.     Findings: No erythema or rash.  Neurological:     Mental Status: He is alert and oriented to person, place, and time.     ED Results and Treatments Labs (all labs ordered are listed, but only abnormal results are displayed) Labs Reviewed  BASIC METABOLIC PANEL - Abnormal; Notable for the following components:      Result Value   BUN 33 (*)    Creatinine, Ser 2.71 (*)    GFR calc non Af Amer 23 (*)    GFR calc Af Amer 27 (*)    All other components within normal limits  CBG MONITORING, ED - Abnormal; Notable for the following components:   Glucose-Capillary 34 (*)    All other components within normal limits  CBG MONITORING, ED - Abnormal; Notable for the following components:   Glucose-Capillary 149 (*)    All other components within normal limits  CBG MONITORING, ED - Abnormal; Notable for the following components:   Glucose-Capillary 151 (*)    All other components within normal limits  TROPONIN I (HIGH SENSITIVITY) - Abnormal; Notable for the following components:   Troponin I (High Sensitivity) 33 (*)    All other components within normal limits  TROPONIN I (HIGH SENSITIVITY) - Abnormal; Notable for the following components:   Troponin I (High Sensitivity) 39 (*)    All other components within normal limits  TROPONIN I (HIGH SENSITIVITY) - Abnormal; Notable for the following  components:   Troponin I (High Sensitivity) 42 (*)    All other components within normal limits  SARS CORONAVIRUS 2 BY RT PCR [facility] ORDER, [place] LAB)  CBC  CBG MONITORING, ED                                                                                                                         EKG  EKG Interpretation  Date/Time:  Sunday July 10 2020 18:53:47 EDT Ventricular Rate:  58 PR Interval:  134 QRS Duration: 106 QT Interval:  446 QTC Calculation: 437 R Axis:   36 Text Interpretation: Sinus  bradycardia Incomplete right bundle branch block Cannot rule out Inferior infarct , age undetermined Abnormal ECG Baseline wander Confirmed by Addison Lank [phone]) on 07/11/2020 12:46:54 AM      Radiology DG Chest 2 View  Result Date: 07/10/2020 CLINICAL DATA:  Hypertension EXAM: CHEST - 2 VIEW COMPARISON:  02/12/2006 FINDINGS: Heart and mediastinal contours are within normal limits. No focal opacities or effusions. No acute bony abnormality. IMPRESSION: No active cardiopulmonary disease. Electronically Signed   By: Rolm Baptise M.D.   On: 07/10/2020 19:24    Pertinent labs & imaging results that were available during my care of the patient were reviewed by me and considered in my medical decision making (see chart for details).  Medications Ordered in ED Medications  sodium chloride flush (NS) 0.9 % injection 3 mL (has no administration in time range)  hydrALAZINE (APRESOLINE) injection 10 mg (has no administration in time range)  acetaminophen (TYLENOL) tablet 1,000 mg (1,000 mg Oral Given 07/11/20 0228)  fentaNYL (SUBLIMAZE) injection 50 mcg (50 mcg Intravenous Given 07/11/20 0229)  furosemide (LASIX) injection 40 mg (40 mg Intravenous Given 07/11/20 0230)                                                                                                                                    Procedures .Critical Care Performed by: Fatima Blank,  MD Authorized by: Fatima Blank, MD    CRITICAL CARE Performed by: [name] Kaidon Kinker Total critical care time: 75 minutes Critical care time was exclusive of separately billable procedures and treating other patients. Critical care was necessary to treat or prevent imminent or life-threatening deterioration. Critical care was time spent personally by me on the following activities: development of treatment plan with patient and/or surrogate as well as nursing, discussions with consultants, evaluation of patient's response to treatment, examination of patient, obtaining history from patient or surrogate, ordering and performing treatments and interventions, ordering and review of laboratory studies, ordering and review of radiographic studies, pulse oximetry and re-evaluation of patient's condition.    (including critical care time)  Medical Decision Making / ED Course I have reviewed the nursing notes for this encounter and the patient's prior records (if available in EHR or on provided paperwork).   Dwayne Churchill. was evaluated in Emergency Department on 07/11/2020 for the symptoms described in the history of present illness. He was evaluated in the context of the global COVID-19 pandemic, which necessitated consideration that the patient might be at risk for infection with the SARS-CoV-2 virus that causes COVID-19. Institutional protocols and algorithms that pertain to the evaluation of patients at risk for COVID-19 are in a state of rapid change based on information released by regulatory bodies including the CDC and federal and state organizations. These policies and algorithms were followed during the patient's care in the ED.    Clinical Course as of Jul 12 655  Mon Jul 11, 2020  0130 Patient presents for hypertension with systolics around 397Q. Patient denies any chest pain or shortness of breath. He does have peripheral edema which has been ongoing for 3  weeks. Patient has been seen by his primary care provider and cardiologist recently who adjusted his blood pressure medication and added hydralazine.  At that time patient was noted to have renal sufficiency with a creatinine of 2.9.  Labs today show stable renal function. EKG without acute ischemic changes or evidence of pericarditis. Troponin mildly elevated but given patient's lack of chest pain this is likely related to demand from elevated blood pressure.  We will repeat a delta Trop to determine trend.  Patient does report mild occipital headache 3 days.  Will provide patient with oral and IV pain medicine.  Patient reports headache has been gradual onset, doubt ICH.     [PC]  0215 Patient noted to be hypoglycemic.  Patient is on glimepiride and Victoza.  Will provide patient with food intake and obtain repeat CBG for the next 1-2 hours to ensure stability.   [PC]  [id number] Trop continues to trend up. BP did initially improve with lasix, but is up again. Will give hydralazine and discuss case with medicine to admit for better BP control and trop trend.   [PC]    Clinical Course User Index [PC] [name], Dwayne Sessions, MD     Final Clinical Impression(s) / ED Diagnoses Final diagnoses:  Hypertensive urgency      This chart was dictated using voice recognition software.  Despite best efforts to proofread,  errors can occur which can change the documentation meaning.   Fatima Blank, MD 07/11/20 [phone]

## 2020-07-11 NOTE — ED Notes (Signed)
Lunch Tray Ordered @ 1033. 

## 2020-07-11 NOTE — H&P (Signed)
History and Physical    [name]. ACZ:[id number] DOB: 10/15/1951 DOA: 07/10/2020  PCP: Dwayne Morning, DO Consultants:  [name] - cardiology; [name] - rheumatology Patient coming from: Home - lives with wife; NOK: Wife, [name], [phone]  Chief Complaint: hypertensive crisis  HPI: [name]. is a 69 y.o. male with medical history significant of OSA on CPAP; RA; HTN; HLD; DM; morbid obesity (BMI 43.86); and CKD presenting with HTN and headache despite cardiology adding 2 additional medications last week for uncontrolled HTN.  He reports that Dr. Maudie Nguyen changed his BP medications due to CKD.  He was having some issues with his BP and Dr. Theda Nguyen restarted prior medications (losartan, HCTZ).  He saw his PCP again and his BP was quite high - 200/98.  He was sent to cardiology, but his BP was better - 155/-.  He was told if it went back up to 200/100 to come to the hospital and it was up at home and so he came in.  He felt ok.  He has had intermittent headache starting about 4 days ago - never a bad headache, just 3/10, "enough to aggravate you."  Intermittent blurred vision which he thinks is when his blood sugar drops and when he transitions from outside to indoor vision.  No CP.  +mild SOB, ?related to OSA.    ED Course:  Hypertensive crisis, uptrending troponin.  Uncontrolled BP - PCP sent to cardiology, who restarted hydralazine and Imdur.  Still SBP 180-200.  Mild headache, no head CT due to low suspicious for acute disease.  Given Lasix with improved BP but with uptrending trop needs obs.  Review of Systems: As per HPI; otherwise review of systems reviewed and negative.   Ambulatory Status:  Ambulates without assistance  COVID Vaccine Status:  None  Past Medical History:  Diagnosis Date  . Asthma   . Chronic kidney disease   . Diabetes mellitus without complication ([place])   . Hyperlipidemia   . Hypertension   . Obesity   . Pituitary macroadenoma ([place])   . Rheumatoid  arthritis ([place])   . Sleep apnea     Past Surgical History:  Procedure Laterality Date  . Arthroscopic knee surgery Right 1998  . Gamma knife surgery  2011  . HAND SURGERY    . THYROIDECTOMY    . TONSILLECTOMY    . TUMOR REMOVAL     Pituitary    Social History   Socioeconomic History  . Marital status: Married    Spouse name: Not on file  . Number of children: 2  . Years of education: Not on file  . Highest education level: Not on file  Occupational History  . Not on file  Tobacco Use  . Smoking status: Current Every Day Smoker    Packs/day: 1.00    Years: 40.00    Pack years: 40.00  . Smokeless tobacco: Never Used  Vaping Use  . Vaping Use: Never used  Substance and Sexual Activity  . Alcohol use: Yes    Comment: occasionally  . Drug use: Not Currently    Types: Marijuana  . Sexual activity: Not on file  Other Topics Concern  . Not on file  Social History Narrative  . Not on file   Social Determinants of Health   Financial Resource Strain:   . Difficulty of Paying Living Expenses:   Food Insecurity:   . Worried About Charity fundraiser in the Last Year:   .  Ran Out of Food in the Last Year:   Transportation Needs:   . Film/video editor (Medical):   Marland Kitchen Lack of Transportation (Non-Medical):   Physical Activity:   . Days of Exercise per Week:   . Minutes of Exercise per Session:   Stress:   . Feeling of Stress :   Social Connections:   . Frequency of Communication with Friends and Family:   . Frequency of Social Gatherings with Friends and Family:   . Attends Religious Services:   . Active Member of Clubs or Organizations:   . Attends Archivist Meetings:   Marland Kitchen Marital Status:   Intimate Partner Violence:   . Fear of Current or Ex-Partner:   . Emotionally Abused:   Marland Kitchen Physically Abused:   . Sexually Abused:     Allergies  Allergen Reactions  . Atorvastatin Other (See Comments)    unknown  . Wilder Glade [Dapagliflozin] Other (See  Comments)    Yeast infection   . Folic Acid Hives  . Humira [Adalimumab] Other (See Comments)    Caused a stroke   . Metformin And Related Other (See Comments)    Weak if takes 1000 mg  . Plavix [Clopidogrel Bisulfate] Hives  . Simvastatin Other (See Comments)    Couldn't think straight     Family History  Problem Relation Age of Onset  . Hypertension Mother   . Emphysema Father   . Healthy Sister   . Healthy Brother   . Healthy Sister     Prior to Admission medications   Medication Sig Start Date End Date Taking? Authorizing Provider  albuterol (PROVENTIL HFA;VENTOLIN HFA) 108 (90 Base) MCG/ACT inhaler Inhale 1-2 puffs into the lungs every 6 (six) hours as needed for wheezing or shortness of breath.   Yes [provider]  aspirin EC 81 MG tablet Take 81 mg by mouth every evening.   Yes [provider]  cyclobenzaprine (FLEXERIL) 10 MG tablet Take 10 mg by mouth at bedtime as needed for muscle spasms.    Yes [provider]  diclofenac Sodium (VOLTAREN) 1 % GEL Apply 2 g topically 4 (four) times daily.   Yes [provider]  EPINEPHrine 0.3 mg/0.3 mL IJ SOAJ injection Inject 0.3 mLs (0.3 mg total) into the muscle as needed for up to 2 doses. 08/06/18  Yes Dwayne Baltimore, MD  escitalopram (LEXAPRO) 10 MG tablet Take 10 mg by mouth daily.   Yes [provider]  furosemide (LASIX) 80 MG tablet Take 80 mg by mouth 2 (two) times daily.  06/18/20  Yes [provider]  glimepiride (AMARYL) 4 MG tablet Take 4 mg by mouth daily with breakfast.    Yes [provider]  hydrALAZINE (APRESOLINE) 25 MG tablet Take 1 tablet (25 mg total) by mouth 3 (three) times daily. 07/07/20 08/06/20 Yes Nguyen, Sunit, DO  isosorbide dinitrate (ISORDIL) 20 MG tablet Take 1 tablet (20 mg total) by mouth 3 (three) times daily. 07/07/20 08/06/20 Yes Nguyen, Sunit, DO  leflunomide (ARAVA) 10 MG tablet Take 10 mg by mouth daily. 06/25/20  Yes [provider]   levothyroxine (SYNTHROID, LEVOTHROID) 200 MCG tablet Take 200 mcg by mouth daily before breakfast.    Yes [provider]  liraglutide (VICTOZA) 18 MG/3ML SOPN Inject 0.6 mg into the skin at bedtime.   Yes [provider]  losartan (COZAAR) 100 MG tablet Take 100 mg by mouth daily.    Yes [provider]  metoprolol succinate (TOPROL-XL)  50 MG 24 hr tablet Take 50 mg by mouth at bedtime. 07/04/20  Yes [provider]  pravastatin (PRAVACHOL) 40 MG tablet Take 40 mg by mouth every evening.   Yes [provider]  predniSONE (DELTASONE) 5 MG tablet Take 5 mg by mouth daily. 06/02/20  Yes [provider]  Tofacitinib Citrate (XELJANZ) 5 MG TABS Take 5 mg by mouth 2 (two) times daily.   Yes [provider]    Physical Exam: Vitals:   07/11/20 0800 07/11/20 1018 07/11/20 1137 07/11/20 1138  BP: (!) 156/67 (!) 158/60  (!) 174/85  Pulse: (!) 56 (!) 54  (!) 58  Resp: 13 18  16   Temp:    (!) 97.4 F (36.3 C)  TempSrc:    Oral  SpO2: 98% 99%  99%  Weight:      Height:   5\' 9"  (1.753 m)      . General:  Appears calm and comfortable and is NAD . Eyes:  PERRL, EOMI, normal lids, iris . ENT:  Hard of hearing, grossly normal lips & tongue, mmm . Neck:  no LAD, masses or thyromegaly . Cardiovascular:  RRR, no m/r/g. No LE edema.  Marland Kitchen Respiratory:   CTA bilaterally with no wheezes/rales/rhonchi.  Normal respiratory effort. . Abdomen:  soft, NT, ND, NABS . Back:   normal alignment, no CVAT . Skin:  no rash or induration seen on limited exam . Musculoskeletal:  grossly normal tone BUE/BLE, good ROM, no bony abnormality . Psychiatric:  grossly normal mood and affect, speech fluent and appropriate, AOx3 . Neurologic:  CN 2-12 grossly intact, moves all extremities in coordinated fashion    Radiological Exams on Admission: DG Chest 2 View  Result Date: 07/10/2020 CLINICAL DATA:  Hypertension EXAM: CHEST - 2 VIEW COMPARISON:  02/12/2006  FINDINGS: Heart and mediastinal contours are within normal limits. No focal opacities or effusions. No acute bony abnormality. IMPRESSION: No active cardiopulmonary disease. Electronically Signed   By: Rolm Baptise M.D.   On: 07/10/2020 19:24    EKG: Independently reviewed.  NSR with rate 60; IVCD; nonspecific ST changes with no evidence of acute ischemia   Labs on Admission: I have personally reviewed the available labs and imaging studies at the time of the admission.  Pertinent labs:   Glucose 75, 34, 149, 151 BUN 33/Creatinine 2.71/GFR 23; 36/2.9/23 on 8/2 HS troponin 33, 39, 42 Normal CBC   Assessment/Plan Principal Problem:   Hypertensive crisis Active Problems:   OBESITY-MORBID (>100')   OSA on CPAP   Rheumatoid arthritis (HCC)   Hyperlipidemia   Diabetes mellitus without complication (HCC)   Stage 4 chronic kidney disease (HCC)   Hypertensive crisis -Patient presenting with uncontrolled HTN and headache -He did not have obvious evidence of acute end organ failure -This may be more a matter of suboptimal HTN control than crisis -Will obtain head CT given reported headache -Based on his BP control no longer in a dangerous range, will observe patient on telemetry rather than placing in ICU -Minimally uptrending troponin with negative delta and no CP - suspect demand ischemia and will not continue to trend at this time -Continue ASA -Continue Lasix -Increase hydralazine to 50 mg TID -Continue Imdur -Continue Toprol XL -Stop Losartan and do not restart HCTZ at this time due to CKD -Will add prn hydralazine  Stage 4 CKD -Patient with progressive CKD, now stage 4 -Suggest outpatient nephrology consult -If his BP remains uncontrolled, he is very likely to progress to  stage 5  DM -Will check A1c -hold Amaryl, Victoza -Cover with moderate-scale SSI  Depression -Continue Lexapro  Hypothyroidism -Check TSH -Continue Synthroid at current dose for  now  HLD -Continue Pravachol  RA -Continue lefunomide, Xeljanz, prednisone  OSA on CPAP -Continue CPAP  Morbid obesity Body mass index is 43.86 kg/m. -Weight loss should be encouraged -Outpatient PCP/bariatric medicine/bariatric surgery f/u encouraged    Note: This patient has been tested and is negative for the novel coronavirus COVID-19.  DVT prophylaxis:  Lovenox  Code Status:  Full - confirmed with patient Family Communication: None present Disposition Plan:  The patient is from: home  Anticipated d/c is to: home without [facility] services   Anticipated d/c date will depend on clinical response to treatment, but possibly as early as tomorrow if he has excellent response to treatment  Patient is currently: acutely ill Consults called:  None Admission status:  It is my clinical opinion that referral for OBSERVATION is reasonable and necessary in this patient based on the above information provided. The aforementioned taken together are felt to place the patient at high risk for further clinical deterioration. However it is anticipated that the patient may be medically stable for discharge from the hospital within 24 to 48 hours.    Karmen Bongo MD [facility]   How to contact the [facility] Attending or Consulting provider [place] or covering provider during after hours [place], for this patient?  1. Check the care team in [facility] and look for a) attending/consulting TR[facility] provider listed and b) the [facility] team listed 2. Log into [email or web address] and use [place]'s universal password to access. If you do not have the password, please contact the hospital operator. 3. Locate the [facility] provider you are looking for under [facility] and page to a number that you can be directly reached. 4. If you still have difficulty reaching the provider, please page the [facility] (Director on Call) for the Hospitalists listed on amion for assistance.   07/11/2020, 2:53 PM

## 2020-07-11 NOTE — ED Notes (Signed)
bld sugar 34  Pt given a sandwich and regular drink

## 2020-07-12 ENCOUNTER — Telehealth: Payer: Self-pay | Admitting: Cardiology

## 2020-07-12 DIAGNOSIS — I160 Hypertensive urgency: Secondary | ICD-10-CM | POA: Diagnosis not present

## 2020-07-12 DIAGNOSIS — I169 Hypertensive crisis, unspecified: Secondary | ICD-10-CM | POA: Diagnosis not present

## 2020-07-12 LAB — CBC
HCT: 38.5 % — ABNORMAL LOW (ref 39.0–52.0)
Hemoglobin: 12.5 g/dL — ABNORMAL LOW (ref 13.0–17.0)
MCH: 30.1 pg (ref 26.0–34.0)
MCHC: 32.5 g/dL (ref 30.0–36.0)
MCV: 92.8 fL (ref 80.0–100.0)
Platelets: 218 10*3/uL (ref 150–400)
RBC: 4.15 MIL/uL — ABNORMAL LOW (ref 4.22–5.81)
RDW: 14.4 % (ref 11.5–15.5)
WBC: 7.9 10*3/uL (ref 4.0–10.5)
nRBC: 0 % (ref 0.0–0.2)

## 2020-07-12 LAB — GLUCOSE, CAPILLARY
Glucose-Capillary: 115 mg/dL — ABNORMAL HIGH (ref 70–99)
Glucose-Capillary: 162 mg/dL — ABNORMAL HIGH (ref 70–99)
Glucose-Capillary: 224 mg/dL — ABNORMAL HIGH (ref 70–99)

## 2020-07-12 LAB — BASIC METABOLIC PANEL
Anion gap: 11 (ref 5–15)
BUN: 32 mg/dL — ABNORMAL HIGH (ref 8–23)
CO2: 29 mmol/L (ref 22–32)
Calcium: 9.1 mg/dL (ref 8.9–10.3)
Chloride: 103 mmol/L (ref 98–111)
Creatinine, Ser: 2.66 mg/dL — ABNORMAL HIGH (ref 0.61–1.24)
GFR calc Af Amer: 28 mL/min — ABNORMAL LOW (ref >60)
GFR calc non Af Amer: 24 mL/min — ABNORMAL LOW (ref >60)
Glucose, Bld: 126 mg/dL — ABNORMAL HIGH (ref 70–99)
Potassium: 3.4 mmol/L — ABNORMAL LOW (ref 3.5–5.1)
Sodium: 143 mmol/L (ref 135–145)

## 2020-07-12 MED ORDER — HYDRALAZINE HCL 100 MG PO TABS: 100.0000 mg | ORAL_TABLET | Freq: Three times a day (TID) | ORAL | 0 refills | Status: DC

## 2020-07-12 MED ORDER — HYDRALAZINE HCL 50 MG PO TABS
100.0000 mg | ORAL_TABLET | Freq: Three times a day (TID) | ORAL | Status: DC
  Administered 2020-07-12: 100 mg via ORAL
  Filled 2020-07-12: qty 2

## 2020-07-12 NOTE — TOC Transition Note (Signed)
Transition of Care [facility]) - CM/SW Discharge Note   Patient Details  Name: [name]. MRN: [id number] Date of Birth: 1951-07-13  Transition of Care [facility]) CM/SW Contact:  Pollie Friar, RN Phone Number: 07/12/2020, 2:52 PM   Clinical Narrative:    Pt discharging home with self care. Pt has supervision at home and transportation to home.    Final next level of care: Home/Self Care Barriers to Discharge: No Barriers Identified   Patient Goals and CMS Choice        Discharge Placement                       Discharge Plan and Services                                     Social Determinants of Health (SDOH) Interventions     Readmission Risk Interventions No flowsheet data found.

## 2020-07-12 NOTE — Care Management Obs Status (Signed)
[place] NOTIFICATION   Patient Details  Name: [name]. MRN: [id number] Date of Birth: Feb 08, 1951   Medicare Observation Status Notification Given:  Yes    Pollie Friar, RN 07/12/2020, 2:35 PM

## 2020-07-12 NOTE — Discharge Summary (Signed)
Physician Discharge Summary  Dwayne Mull Jr. FTD:[id number] DOB: Dec 03, 1951 DOA: 07/10/2020  PCP: [name], DO  Admit date: 07/10/2020 Discharge date: 07/12/2020  Admitted From: Home Disposition: Home  Recommendations for Outpatient Follow-up:  1. Follow up with PCP in 1-2 weeks 2. Follow-up with cardiology as scheduled on 07/14/2020 3. Increase hydralazine from 25 mg to 100 mg p.o. 3 times daily 4. Please obtain BMP in one week 5. Please follow up on blood pressure log and will possibly need further titration of his antihypertensive regimen  Home Health: No Equipment/Devices: None  Discharge Condition: Stable CODE STATUS: Full code Diet recommendation: Heart healthy/consistent carbohydrate diet  History of present illness:  [name]. is a 69 y.o. male with medical history significant of OSA on CPAP; RA; HTN; HLD; DM; morbid obesity (BMI 43.86); and CKD presenting with HTN and headache despite cardiology adding 2 additional medications last week for uncontrolled HTN.  He reports that Dr. Maudie Mercury changed his BP medications due to CKD.  He was having some issues with his BP and Dr. Theda Sers restarted prior medications (losartan, HCTZ).  He saw his PCP again and his BP was quite high - 200/98.  He was sent to cardiology, but his BP was better - 155/-.  He was told if it went back up to 200/100 to come to the hospital and it was up at home and so he came in.  He felt ok.  He has had intermittent headache starting about 4 days ago - never a bad headache, just 3/10, "enough to aggravate you."  Intermittent blurred vision which he thinks is when his blood sugar drops and when he transitions from outside to indoor vision.  No CP.  +mild SOB, ?related to OSA.  Hospital course:  Hypertensive crisis Patient presenting with persistent headache and uncontrolled hypertension.  No obvious evidence of end organ failure.  CT head without contrast negative for acute intracranial abnormality.  Was  continued on his home furosemide 80 mg p.o. twice daily, isosorbide dinitrate 20 mg p.o. 3 times daily, metoprolol succinate 50 mg p.o. nightly.  His hydralazine was increased during hospitalization from 25 mg to 100 mg p.o. 3 times daily with better pressure control.  Recommend patient monitor his blood pressure once daily in the morning and to bring log to next PCP/cardiology visit.  Will likely need further antihypertensive titration moving forward.  Stage 4 CKD Patient with progressive CKD, now stage 4.  Recommend to discontinue losartan.  Suggest outpatient nephrology referral. If his BP remains uncontrolled, he is very likely to progress to stage 5  Type 2 diabetes mellitus Hemoglobin A1c 6.6, well controlled.  Continue home Amaryl and Victoza  Depression Continue Lexapro  Hypothyroidism TSH 0.616.  Since 200 mg p.o. daily  HLD Continue Pravachol  RA Continue lefunomide, Xeljanz, prednisone  OSA on CPAP Continue CPAP  Morbid obesity Body mass index is 43.86 kg/m.  Discussed with patient needs aggressive weight loss measures/lifestyle changes as this complicates all facets of care.  Follow-up outpatient PCP, consideration of bariatric medicine consultation.  Discharge Diagnoses:  Active Problems:   OBESITY-MORBID (>100')   OSA on CPAP   Rheumatoid arthritis (HCC)   Hyperlipidemia   Diabetes mellitus without complication (HCC)   Stage 4 chronic kidney disease [facility])    Discharge Instructions  Discharge Instructions    Call MD for:  difficulty breathing, headache or visual disturbances   Complete by: As directed    Call MD for:  extreme fatigue  Complete by: As directed    Call MD for:  persistant dizziness or light-headedness   Complete by: As directed    Call MD for:  persistant nausea and vomiting   Complete by: As directed    Call MD for:  severe uncontrolled pain   Complete by: As directed    Call MD for:  temperature >100.4   Complete by: As  directed    Diet - low sodium heart healthy   Complete by: As directed    Increase activity slowly   Complete by: As directed      Allergies as of 07/12/2020      Reactions   Atorvastatin Other (See Comments)   unknown   Farxiga [dapagliflozin] Other (See Comments)   Yeast infection    Folic Acid Hives   Humira [adalimumab] Other (See Comments)   Caused a stroke    Metformin And Related Other (See Comments)   Weak if takes 1000 mg   Plavix [clopidogrel Bisulfate] Hives   Simvastatin Other (See Comments)   Couldn't think straight       Medication List    TAKE these medications   albuterol 108 (90 Base) MCG/ACT inhaler Commonly known as: VENTOLIN HFA Inhale 1-2 puffs into the lungs every 6 (six) hours as needed for wheezing or shortness of breath.   aspirin EC 81 MG tablet Take 81 mg by mouth every evening.   cyclobenzaprine 10 MG tablet Commonly known as: FLEXERIL Take 10 mg by mouth at bedtime as needed for muscle spasms.   EPINEPHrine 0.3 mg/0.3 mL Soaj injection Commonly known as: EPI-PEN Inject 0.3 mLs (0.3 mg total) into the muscle as needed for up to 2 doses.   escitalopram 10 MG tablet Commonly known as: LEXAPRO Take 10 mg by mouth daily.   furosemide 80 MG tablet Commonly known as: LASIX Take 80 mg by mouth 2 (two) times daily.   glimepiride 4 MG tablet Commonly known as: AMARYL Take 4 mg by mouth daily with breakfast.   hydrALAZINE 100 MG tablet Commonly known as: APRESOLINE Take 1 tablet (100 mg total) by mouth 3 (three) times daily. What changed:   medication strength  how much to take   isosorbide dinitrate 20 MG tablet Commonly known as: ISORDIL Take 1 tablet (20 mg total) by mouth 3 (three) times daily.   leflunomide 10 MG tablet Commonly known as: ARAVA Take 10 mg by mouth daily.   levothyroxine 200 MCG tablet Commonly known as: SYNTHROID Take 200 mcg by mouth daily before breakfast.   liraglutide 18 MG/3ML Sopn Commonly known  as: VICTOZA Inject 0.6 mg into the skin at bedtime.   metoprolol succinate 50 MG 24 hr tablet Commonly known as: TOPROL-XL Take 50 mg by mouth at bedtime.   pravastatin 40 MG tablet Commonly known as: PRAVACHOL Take 40 mg by mouth every evening.   predniSONE 5 MG tablet Commonly known as: DELTASONE Take 5 mg by mouth daily.   Voltaren 1 % Gel Generic drug: diclofenac Sodium Apply 2 g topically 4 (four) times daily.   Xeljanz 5 MG Tabs Generic drug: Tofacitinib Citrate Take 5 mg by mouth 2 (two) times daily.       Follow-up Information    [name], DO. Schedule an appointment as soon as possible for a visit in 1 week(s).   Specialty: Family Medicine Contact information: [address] [place] [address] [phone]  Allergies  Allergen Reactions  . Atorvastatin Other (See Comments)    unknown  . Wilder Glade [Dapagliflozin] Other (See Comments)    Yeast infection   . Folic Acid Hives  . Humira [Adalimumab] Other (See Comments)    Caused a stroke   . Metformin And Related Other (See Comments)    Weak if takes 1000 mg  . Plavix [Clopidogrel Bisulfate] Hives  . Simvastatin Other (See Comments)    Couldn't think straight     Consultations:  None   Procedures/Studies: DG Chest 2 View  Result Date: 07/10/2020 CLINICAL DATA:  Hypertension EXAM: CHEST - 2 VIEW COMPARISON:  02/12/2006 FINDINGS: Heart and mediastinal contours are within normal limits. No focal opacities or effusions. No acute bony abnormality. IMPRESSION: No active cardiopulmonary disease. Electronically Signed   By: Rolm Baptise M.D.   On: 07/10/2020 19:24   CT HEAD WO CONTRAST  Result Date: 07/11/2020 CLINICAL DATA:  Benign intracranial hypertension. EXAM: CT HEAD WITHOUT CONTRAST TECHNIQUE: Contiguous axial images were obtained from the base of the skull through the vertex without intravenous contrast. COMPARISON:  MRI dated 02/14/2015.  CT head dated December 21, 2008. FINDINGS: Brain: No hemorrhage. No extraaxial collection.No midline shift. Mild atrophy is noted.The basal cisterns are unremarkable. Patchy and confluent areas of decreased attenuation are noted throughout the deep and periventricular white matter of the cerebral hemispheres bilaterally, compatible with chronic microvascular ischemic disease. The brainstem is unremarkable. The cerebellum is unremarkable. The sella is unremarkable. Vascular: No hyperdense vessel or unexpected calcification. Skull: The calvarium is unremarkable. The skull base is unremarkable. The visualized upper cervical spine is unremarkable. Sinuses/Orbits: The visualized orbits are unremarkable. The paranasal sinuses are unremarkable. The mastoid air cells are clear. Other: The visualized parotid gland is unremarkable. There is no scalp soft tissue swelling. IMPRESSION: 1. No acute intracranial abnormality. 2. Mild atrophy and chronic microvascular ischemic changes of the cerebral white matter. Electronically Signed   By: Constance Holster M.D.   On: 07/11/2020 15:40   VAS [place] LOWER EXTREMITY VENOUS (DVT)  Result Date: 07/09/2020  Lower Venous DVTStudy Indications: Swelling.  Limitations: Poor ultrasound/tissue interface and body habitus. Comparison Study: No prior studies. Performing Technologist: Darlin Coco  Examination Guidelines: A complete evaluation includes B-mode imaging, spectral Doppler, color Doppler, and power Doppler as needed of all accessible portions of each vessel. Bilateral testing is considered an integral part of a complete examination. Limited examinations for reoccurring indications may be performed as noted. The reflux portion of the exam is performed with the patient in reverse Trendelenburg.  +---------+---------------+---------+-----------+----------+--------------+ RIGHT    CompressibilityPhasicitySpontaneityPropertiesThrombus Aging  +---------+---------------+---------+-----------+----------+--------------+ CFV      Full           Yes      Yes                                 +---------+---------------+---------+-----------+----------+--------------+ SFJ      Full                                                        +---------+---------------+---------+-----------+----------+--------------+ FV Prox  Full                                                        +---------+---------------+---------+-----------+----------+--------------+  FV Mid   Full                                                        +---------+---------------+---------+-----------+----------+--------------+ FV DistalFull                                                        +---------+---------------+---------+-----------+----------+--------------+ PFV      Full                                                        +---------+---------------+---------+-----------+----------+--------------+ POP      Full           Yes      Yes                                 +---------+---------------+---------+-----------+----------+--------------+ PTV      Full                                                        +---------+---------------+---------+-----------+----------+--------------+ PERO     Full                                                        +---------+---------------+---------+-----------+----------+--------------+   +---------+---------------+---------+-----------+----------+----------------+ LEFT     CompressibilityPhasicitySpontaneityPropertiesThrombus Aging   +---------+---------------+---------+-----------+----------+----------------+ CFV      Full           Yes      Yes                                   +---------+---------------+---------+-----------+----------+----------------+ SFJ      Full                                                           +---------+---------------+---------+-----------+----------+----------------+ FV Prox  Full                                                          +---------+---------------+---------+-----------+----------+----------------+ FV Mid   Full                                                          +---------+---------------+---------+-----------+----------+----------------+  FV DistalFull                                                          +---------+---------------+---------+-----------+----------+----------------+ PFV      Full                                                          +---------+---------------+---------+-----------+----------+----------------+ POP      Full                                                          +---------+---------------+---------+-----------+----------+----------------+ PTV      Full                                                          +---------+---------------+---------+-----------+----------+----------------+ PERO                                                  Patent by color. +---------+---------------+---------+-----------+----------+----------------+     Summary: RIGHT: - There is no evidence of deep vein thrombosis in the lower extremity.  - No cystic structure found in the popliteal fossa.  LEFT: - There is no evidence of deep vein thrombosis in the lower extremity. However, portions of this examination were limited- see technologist comments above.  - A cystic structure is found in the popliteal fossa.  *See table(s) above for measurements and observations. Electronically signed by Harold Barban MD on 07/09/2020 at 1:16:55 AM.    Final      Subjective:   Discharge Exam: Vitals:   07/12/20 0834 07/12/20 1233  BP: (!) 180/70 (!) 157/56  Pulse: (!) 51 (!) 52  Resp:    Temp:  98 F (36.7 C)  SpO2:     Vitals:   07/11/20 2331 07/12/20 0405 07/12/20 0834 07/12/20 1233  BP: (!) 180/65 (!) 170/72 (!)  180/70 (!) 157/56  Pulse: 61 64 (!) 51 (!) 52  Resp: 16 16    Temp: 98.1 F (36.7 C) (!) 97.4 F (36.3 C)  98 F (36.7 C)  TempSrc: Oral Oral    SpO2: 99% 97%    Weight:      Height:        General: Pt is alert, awake, not in acute distress, obese Cardiovascular: RRR, S1/S2 +, no rubs, no gallops Respiratory: CTA bilaterally, no wheezing, no rhonchi Abdominal: Soft, NT, ND, bowel sounds + Extremities: no edema, no cyanosis    The results of significant diagnostics from this hospitalization (including imaging, microbiology, ancillary and laboratory) are listed below for reference.     Microbiology: Recent Results (from the past 240 hour(s))  SARS Coronavirus 2 by RT  PCR (hospital order, performed in [facility] hospital lab) Nasopharyngeal Nasopharyngeal Swab     Status: None   Collection Time: 07/11/20  7:35 AM   Specimen: Nasopharyngeal Swab  Result Value Ref Range Status   SARS Coronavirus 2 NEGATIVE NEGATIVE Final    Comment: (NOTE) SARS-CoV-2 target nucleic acids are NOT DETECTED.  The SARS-CoV-2 RNA is generally detectable in upper and lower respiratory specimens during the acute phase of infection. The lowest concentration of SARS-CoV-2 viral copies this assay can detect is 250 copies / mL. A negative result does not preclude SARS-CoV-2 infection and should not be used as the sole basis for treatment or other patient management decisions.  A negative result may occur with improper specimen collection / handling, submission of specimen other than nasopharyngeal swab, presence of viral mutation(s) within the areas targeted by this assay, and inadequate number of viral copies (<250 copies / mL). A negative result must be combined with clinical observations, patient history, and epidemiological information.  Fact Sheet for Patients:   StrictlyIdeas.no  Fact Sheet for Healthcare Providers: BankingDealers.co.za  This  test is not yet approved or  cleared by the [place] FDA and has been authorized for detection and/or diagnosis of SARS-CoV-2 by FDA under an Emergency Use Authorization (EUA).  This EUA will remain in effect (meaning this test can be used) for the duration of the COVID-19 declaration under Section 564(b)(1) of the Act, 21 U.S.C. section 360bbb-3(b)(1), unless the authorization is terminated or revoked sooner.  Performed at [facility], [facility] [address] [place], [place] [address]      Labs: BNP (last 3 results) No results for input(s): BNP in the last 8760 hours. Basic Metabolic Panel: Recent Labs  Lab 07/10/20 1855 07/12/20 0252  NA 140 143  K 3.8 3.4*  CL 102 103  CO2 27 29  GLUCOSE 75 126*  BUN 33* 32*  CREATININE 2.71* 2.66*  CALCIUM 9.7 9.1   Liver Function Tests: No results for input(s): AST, ALT, ALKPHOS, BILITOT, PROT, ALBUMIN in the last 168 hours. No results for input(s): LIPASE, AMYLASE in the last 168 hours. No results for input(s): AMMONIA in the last 168 hours. CBC: Recent Labs  Lab 07/10/20 1855 07/12/20 0252  WBC 8.1 7.9  HGB 13.0 12.5*  HCT 40.8 38.5*  MCV 92.1 92.8  PLT 279 218   Cardiac Enzymes: No results for input(s): CKTOTAL, CKMB, CKMBINDEX, TROPONINI in the last 168 hours. BNP: Invalid input(s): POCBNP CBG: Recent Labs  Lab 07/11/20 1606 07/11/20 2105 07/12/20 0613 07/12/20 1115 07/12/20 1233  GLUCAP 229* 142* 115* 162* 224*   D-Dimer No results for input(s): DDIMER in the last 72 hours. Hgb A1c Recent Labs    07/11/20 1438  HGBA1C 6.6*   Lipid Profile No results for input(s): CHOL, HDL, LDLCALC, TRIG, CHOLHDL, LDLDIRECT in the last 72 hours. Thyroid function studies Recent Labs    07/11/20 1851  TSH 0.616   Anemia work up No results for input(s): VITAMINB12, FOLATE, FERRITIN, TIBC, IRON, RETICCTPCT in the last 72 hours. Urinalysis No results found for: COLORURINE, APPEARANCEUR, [place], [place],  GLUCOSEU, [place], [place], [place], PROTEINUR, UROBILINOGEN, NITRITE, LEUKOCYTESUR Sepsis Labs Invalid input(s): PROCALCITONIN,  WBC,  LACTICIDVEN Microbiology Recent Results (from the past 240 hour(s))  SARS Coronavirus 2 by RT PCR (hospital order, performed in [facility] hospital lab) Nasopharyngeal Nasopharyngeal Swab     Status: None   Collection Time: 07/11/20  7:35 AM   Specimen: Nasopharyngeal Swab  Result Value Ref  Range Status   SARS Coronavirus 2 NEGATIVE NEGATIVE Final    Comment: (NOTE) SARS-CoV-2 target nucleic acids are NOT DETECTED.  The SARS-CoV-2 RNA is generally detectable in upper and lower respiratory specimens during the acute phase of infection. The lowest concentration of SARS-CoV-2 viral copies this assay can detect is 250 copies / mL. A negative result does not preclude SARS-CoV-2 infection and should not be used as the sole basis for treatment or other patient management decisions.  A negative result may occur with improper specimen collection / handling, submission of specimen other than nasopharyngeal swab, presence of viral mutation(s) within the areas targeted by this assay, and inadequate number of viral copies (<250 copies / mL). A negative result must be combined with clinical observations, patient history, and epidemiological information.  Fact Sheet for Patients:   StrictlyIdeas.no  Fact Sheet for Healthcare Providers: BankingDealers.co.za  This test is not yet approved or  cleared by the [place] FDA and has been authorized for detection and/or diagnosis of SARS-CoV-2 by FDA under an Emergency Use Authorization (EUA).  This EUA will remain in effect (meaning this test can be used) for the duration of the COVID-19 declaration under Section 564(b)(1) of the Act, 21 U.S.C. section 360bbb-3(b)(1), unless the authorization is terminated or revoked sooner.  Performed at [place], [place] [address], [place], [place] [address]      Time coordinating discharge: Over 30 minutes  SIGNED:   Loucille Takach J [place] DO  [facility] 07/12/2020, 2:15 PM

## 2020-07-12 NOTE — Telephone Encounter (Signed)
Follow up after hospitalization. Thankyou for letting me know.

## 2020-07-12 NOTE — Progress Notes (Signed)
Pt IV removed, catheter intact. Telemetry removed ccmd aware. Pt d/c education provided at bedside. Pt has all belongings, pt denies wheelchair, ambulated off unit with RN.

## 2020-07-12 NOTE — Discharge Instructions (Signed)
Managing Your Hypertension Hypertension is commonly called high blood pressure. This is when the force of your blood pressing against the walls of your arteries is too strong. Arteries are blood vessels that carry blood from your heart throughout your body. Hypertension forces the heart to work harder to pump blood, and may cause the arteries to become narrow or stiff. Having untreated or uncontrolled hypertension can cause heart attack, stroke, kidney disease, and other problems. What are blood pressure readings? A blood pressure reading consists of a higher number over a lower number. Ideally, your blood pressure should be below 120/80. The first ("top") number is called the systolic pressure. It is a measure of the pressure in your arteries as your heart beats. The second ("bottom") number is called the diastolic pressure. It is a measure of the pressure in your arteries as the heart relaxes. What does my blood pressure reading mean? Blood pressure is classified into four stages. Based on your blood pressure reading, your health care provider may use the following stages to determine what type of treatment you need, if any. Systolic pressure and diastolic pressure are measured in a unit called mm Hg. Normal  Systolic pressure: below 120.  Diastolic pressure: below 80. Elevated  Systolic pressure: 120-129.  Diastolic pressure: below 80. Hypertension stage 1  Systolic pressure: 130-139.  Diastolic pressure: 80-89. Hypertension stage 2  Systolic pressure: 140 or above.  Diastolic pressure: 90 or above. What health risks are associated with hypertension? Managing your hypertension is an important responsibility. Uncontrolled hypertension can lead to:  A heart attack.  A stroke.  A weakened blood vessel (aneurysm).  Heart failure.  Kidney damage.  Eye damage.  Metabolic syndrome.  Memory and concentration problems. What changes can I make to manage my  hypertension? Hypertension can be managed by making lifestyle changes and possibly by taking medicines. Your health care provider will help you make a plan to bring your blood pressure within a normal range. Eating and drinking   Eat a diet that is high in fiber and potassium, and low in salt (sodium), added sugar, and fat. An example eating plan is called the DASH (Dietary Approaches to Stop Hypertension) diet. To eat this way: ? Eat plenty of fresh fruits and vegetables. Try to fill half of your plate at each meal with fruits and vegetables. ? Eat whole grains, such as whole wheat pasta, brown rice, or whole grain bread. Fill about one quarter of your plate with whole grains. ? Eat low-fat diary products. ? Avoid fatty cuts of meat, processed or cured meats, and poultry with skin. Fill about one quarter of your plate with lean proteins such as fish, chicken without skin, beans, eggs, and tofu. ? Avoid premade and processed foods. These tend to be higher in sodium, added sugar, and fat.  Reduce your daily sodium intake. Most people with hypertension should eat less than 1,500 mg of sodium a day.  Limit alcohol intake to no more than 1 drink a day for nonpregnant women and 2 drinks a day for men. One drink equals 12 oz of beer, 5 oz of wine, or 1 oz of hard liquor. Lifestyle  Work with your health care provider to maintain a healthy body weight, or to lose weight. Ask what an ideal weight is for you.  Get at least 30 minutes of exercise that causes your heart to beat faster (aerobic exercise) most days of the week. Activities may include walking, swimming, or biking.  Include exercise   to strengthen your muscles (resistance exercise), such as weight lifting, as part of your weekly exercise routine. Try to do these types of exercises for 30 minutes at least 3 days a week.  Do not use any products that contain nicotine or tobacco, such as cigarettes and e-cigarettes. If you need help quitting,  ask your health care provider.  Control any long-term (chronic) conditions you have, such as high cholesterol or diabetes. Monitoring  Monitor your blood pressure at home as told by your health care provider. Your personal target blood pressure may vary depending on your medical conditions, your age, and other factors.  Have your blood pressure checked regularly, as often as told by your health care provider. Working with your health care provider  Review all the medicines you take with your health care provider because there may be side effects or interactions.  Talk with your health care provider about your diet, exercise habits, and other lifestyle factors that may be contributing to hypertension.  Visit your health care provider regularly. Your health care provider can help you create and adjust your plan for managing hypertension. Will I need medicine to control my blood pressure? Your health care provider may prescribe medicine if lifestyle changes are not enough to get your blood pressure under control, and if:  Your systolic blood pressure is 130 or higher.  Your diastolic blood pressure is 80 or higher. Take medicines only as told by your health care provider. Follow the directions carefully. Blood pressure medicines must be taken as prescribed. The medicine does not work as well when you skip doses. Skipping doses also puts you at risk for problems. Contact a health care provider if:  You think you are having a reaction to medicines you have taken.  You have repeated (recurrent) headaches.  You feel dizzy.  You have swelling in your ankles.  You have trouble with your vision. Get help right away if:  You develop a severe headache or confusion.  You have unusual weakness or numbness, or you feel faint.  You have severe pain in your chest or abdomen.  You vomit repeatedly.  You have trouble breathing. Summary  Hypertension is when the force of blood pumping  through your arteries is too strong. If this condition is not controlled, it may put you at risk for serious complications.  Your personal target blood pressure may vary depending on your medical conditions, your age, and other factors. For most people, a normal blood pressure is less than 120/80.  Hypertension is managed by lifestyle changes, medicines, or both. Lifestyle changes include weight loss, eating a healthy, low-sodium diet, exercising more, and limiting alcohol. This information is not intended to replace advice given to you by your health care provider. Make sure you discuss any questions you have with your health care provider. Document Revised: 03/13/2019 Document Reviewed: 10/17/2016 Elsevier Patient Education  [place].   Hypertension, Adult Hypertension is another name for high blood pressure. High blood pressure forces your heart to work harder to pump blood. This can cause problems over time. There are two numbers in a blood pressure reading. There is a top number (systolic) over a bottom number (diastolic). It is best to have a blood pressure that is below 120/80. Healthy choices can help lower your blood pressure, or you may need medicine to help lower it. What are the causes? The cause of this condition is not known. Some conditions may be related to high blood pressure. What increases the  risk?  Smoking.  Having type 2 diabetes mellitus, high cholesterol, or both.  Not getting enough exercise or physical activity.  Being overweight.  Having too much fat, sugar, calories, or salt (sodium) in your diet.  Drinking too much alcohol.  Having long-term (chronic) kidney disease.  Having a family history of high blood pressure.  Age. Risk increases with age.  Race. You may be at higher risk if you are African American.  Gender. Men are at higher risk than women before age 70. After age 7, women are at higher risk than men.  Having obstructive sleep  apnea.  Stress. What are the signs or symptoms?  High blood pressure may not cause symptoms. Very high blood pressure (hypertensive crisis) may cause: ? Headache. ? Feelings of worry or nervousness (anxiety). ? Shortness of breath. ? Nosebleed. ? A feeling of being sick to your stomach (nausea). ? Throwing up (vomiting). ? Changes in how you see. ? Very bad chest pain. ? Seizures. How is this treated?  This condition is treated by making healthy lifestyle changes, such as: ? Eating healthy foods. ? Exercising more. ? Drinking less alcohol.  Your health care provider may prescribe medicine if lifestyle changes are not enough to get your blood pressure under control, and if: ? Your top number is above 130. ? Your bottom number is above 80.  Your personal target blood pressure may vary. Follow these instructions at home: Eating and drinking   If told, follow the DASH eating plan. To follow this plan: ? Fill one half of your plate at each meal with fruits and vegetables. ? Fill one fourth of your plate at each meal with whole grains. Whole grains include whole-wheat pasta, brown rice, and whole-grain bread. ? Eat or drink low-fat dairy products, such as skim milk or low-fat yogurt. ? Fill one fourth of your plate at each meal with low-fat (lean) proteins. Low-fat proteins include fish, chicken without skin, eggs, beans, and tofu. ? Avoid fatty meat, cured and processed meat, or chicken with skin. ? Avoid pre-made or processed food.  Eat less than 1,500 mg of salt each day.  Do not drink alcohol if: ? Your doctor tells you not to drink. ? You are pregnant, may be pregnant, or are planning to become pregnant.  If you drink alcohol: ? Limit how much you use to:  0-1 drink a day for women.  0-2 drinks a day for men. ? Be aware of how much alcohol is in your drink. In the [place] one drink equals one 12 oz bottle of beer (355 mL), one 5 oz glass of wine (148 mL), or one 1 oz  glass of hard liquor (44 mL). Lifestyle   Work with your doctor to stay at a healthy weight or to lose weight. Ask your doctor what the best weight is for you.  Get at least 30 minutes of exercise most days of the week. This may include walking, swimming, or biking.  Get at least 30 minutes of exercise that strengthens your muscles (resistance exercise) at least 3 days a week. This may include lifting weights or doing Pilates.  Do not use any products that contain nicotine or tobacco, such as cigarettes, e-cigarettes, and chewing tobacco. If you need help quitting, ask your doctor.  Check your blood pressure at home as told by your doctor.  Keep all follow-up visits as told by your doctor. This is important. Medicines  Take over-the-counter and prescription medicines only as told  by your doctor. Follow directions carefully.  Do not skip doses of blood pressure medicine. The medicine does not work as well if you skip doses. Skipping doses also puts you at risk for problems.  Ask your doctor about side effects or reactions to medicines that you should watch for. Contact a doctor if you:  Think you are having a reaction to the medicine you are taking.  Have headaches that keep coming back (recurring).  Feel dizzy.  Have swelling in your ankles.  Have trouble with your vision. Get help right away if you:  Get a very bad headache.  Start to feel mixed up (confused).  Feel weak or numb.  Feel faint.  Have very bad pain in your: ? Chest. ? Belly (abdomen).  Throw up more than once.  Have trouble breathing. Summary  Hypertension is another name for high blood pressure.  High blood pressure forces your heart to work harder to pump blood.  For most people, a normal blood pressure is less than 120/80.  Making healthy choices can help lower blood pressure. If your blood pressure does not get lower with healthy choices, you may need to take medicine. This information is  not intended to replace advice given to you by your health care provider. Make sure you discuss any questions you have with your health care provider. Document Revised: 07/30/2018 Document Reviewed: 07/30/2018 Elsevier Patient Education  [place].  Blood Pressure Record Sheet To take your blood pressure, you will need a blood pressure machine. You can buy a blood pressure machine (blood pressure monitor) at your clinic, drug store, or online. When choosing one, consider:  An automatic monitor that has an arm cuff.  A cuff that wraps snugly around your upper arm. You should be able to fit only one finger between your arm and the cuff.  A device that stores blood pressure reading results.  Do not choose a monitor that measures your blood pressure from your wrist or finger. Follow your health care provider's instructions for how to take your blood pressure. To use this form:  Get one reading in the morning (a.m.) before you take any medicines.  Get one reading in the evening (p.m.) before supper.  Take at least 2 readings with each blood pressure check. This makes sure the results are correct. Wait 1-2 minutes between measurements.  Write down the results in the spaces on this form.  Repeat this once a week, or as told by your health care provider.  Make a follow-up appointment with your health care provider to discuss the results. Blood pressure log Date: _______________________  a.m. _____________________(1st reading) _____________________(2nd reading)  p.m. _____________________(1st reading) _____________________(2nd reading) Date: _______________________  a.m. _____________________(1st reading) _____________________(2nd reading)  p.m. _____________________(1st reading) _____________________(2nd reading) Date: _______________________  a.m. _____________________(1st reading) _____________________(2nd reading)  p.m. _____________________(1st reading)  _____________________(2nd reading) Date: _______________________  a.m. _____________________(1st reading) _____________________(2nd reading)  p.m. _____________________(1st reading) _____________________(2nd reading) Date: _______________________  a.m. _____________________(1st reading) _____________________(2nd reading)  p.m. _____________________(1st reading) _____________________(2nd reading) This information is not intended to replace advice given to you by your health care provider. Make sure you discuss any questions you have with your health care provider. Document Revised: 01/17/2018 Document Reviewed: 11/19/2017 Elsevier Patient Education  [place].

## 2020-07-12 NOTE — Progress Notes (Signed)
Spoke with patient wife, she stated that patient is currently admitted in the hospital. I gave her the results of his DVT.

## 2020-07-14 ENCOUNTER — Ambulatory Visit: Payer: Medicare Other | Admitting: Cardiology

## 2020-07-14 ENCOUNTER — Encounter: Payer: Self-pay | Admitting: Cardiology

## 2020-07-14 ENCOUNTER — Other Ambulatory Visit: Payer: Self-pay

## 2020-07-14 VITALS — BP 160/72 | HR 58 | Resp 16 | Ht 69.0 in | Wt 297.0 lb

## 2020-07-14 DIAGNOSIS — M069 Rheumatoid arthritis, unspecified: Secondary | ICD-10-CM | POA: Diagnosis not present

## 2020-07-14 DIAGNOSIS — E66813 Obesity, class 3: Secondary | ICD-10-CM

## 2020-07-14 DIAGNOSIS — F17200 Nicotine dependence, unspecified, uncomplicated: Secondary | ICD-10-CM

## 2020-07-14 DIAGNOSIS — Z6841 Body Mass Index (BMI) 40.0 and over, adult: Secondary | ICD-10-CM

## 2020-07-14 DIAGNOSIS — E782 Mixed hyperlipidemia: Secondary | ICD-10-CM | POA: Diagnosis not present

## 2020-07-14 DIAGNOSIS — M7989 Other specified soft tissue disorders: Secondary | ICD-10-CM

## 2020-07-14 DIAGNOSIS — E1165 Type 2 diabetes mellitus with hyperglycemia: Secondary | ICD-10-CM

## 2020-07-14 DIAGNOSIS — R0609 Other forms of dyspnea: Secondary | ICD-10-CM

## 2020-07-14 DIAGNOSIS — E1122 Type 2 diabetes mellitus with diabetic chronic kidney disease: Secondary | ICD-10-CM | POA: Diagnosis not present

## 2020-07-14 DIAGNOSIS — G4733 Obstructive sleep apnea (adult) (pediatric): Secondary | ICD-10-CM | POA: Diagnosis not present

## 2020-07-14 DIAGNOSIS — Z794 Long term (current) use of insulin: Secondary | ICD-10-CM

## 2020-07-14 DIAGNOSIS — I10 Essential (primary) hypertension: Secondary | ICD-10-CM | POA: Diagnosis not present

## 2020-07-14 DIAGNOSIS — N184 Chronic kidney disease, stage 4 (severe): Secondary | ICD-10-CM | POA: Diagnosis not present

## 2020-07-14 DIAGNOSIS — IMO0001 Reserved for inherently not codable concepts without codable children: Secondary | ICD-10-CM

## 2020-07-14 MED ORDER — ISOSORBIDE DINITRATE 40 MG PO TABS: 40.0000 mg | ORAL_TABLET | Freq: Three times a day (TID) | ORAL | 0 refills | Status: DC

## 2020-07-14 NOTE — Progress Notes (Signed)
[name]. Date of Birth: 1951/01/29 MRN: [id number] Primary Care Provider:Collins, Hinton Dyer, DO Former Cardiology Providers: Dr. Adrian Prows Primary Cardiologist: Rex Kras, DO, [facility] (established care 07/07/2020)  Date: 07/14/20 Last Office Visit: 07/07/2020  Chief Complaint  Patient presents with   Hypertension   Follow-up    1 week    HPI  Dwayne Schwandt. is a 69 y.o.  male who presents to the office with a chief complaint of " follow-up for blood pressure." Patient's past medical history and cardiovascular risk factors include: Diabetes mellitus type 2, GERD, hyperlipidemia, hypertension, nonsecretory pituitary macroadenoma, rheumatoid arthritis, bronchitis, active tobacco use, advanced age, obesity due to excess calories.  Patient is referred to the office at the request of his primary care provider Dr. Theda Sers for evaluation and management of hypertension.  Patient was formally under the care of Dr. Adrian Prows and reestablish care with myself as of 07/07/2020.   At last office visit his blood pressure was not well controlled and therefore was started on hydralazine 25 mg p.o. 3 times daily and Isordil 20 mg p.o. 3 times daily. Patient states that he continue to take his medication several days later and continue to notice an upward trend of his systolic blood pressures. Once his systolic blood pressures were in the 200 range and was having headaches he decided go to the hospital as recommended last office visit. Patient states that he was hospitalized from August 8 through July 12, 2020. He was uptitrated to hydralazine 100 mg p.o. 3 times daily. He is here for follow-up.   Patient states that his blood pressure today is elevated probably due to him forgetting his medications yesterday. During his recent hospitalization he was asked to discontinue losartan and follow-up with nephrology as outpatient given his chronic kidney disease.  He did not bring in a blood pressure log as  recommended at the last office visit for me to review.  FUNCTIONAL STATUS: No structured exercise program or daily routine.  ALLERGIES: Allergies  Allergen Reactions   Atorvastatin Other (See Comments)    unknown   [place] [Dapagliflozin] Other (See Comments)    Yeast infection    Folic Acid Hives   Humira [Adalimumab] Other (See Comments)    Caused a stroke    Metformin And Related Other (See Comments)    Weak if takes 1000 mg   Plavix [Clopidogrel Bisulfate] Hives   Simvastatin Other (See Comments)    Couldn't think straight     MEDICATION LIST PRIOR TO VISIT: Current Outpatient Medications on File Prior to Visit  Medication Sig Dispense Refill   albuterol (PROVENTIL HFA;VENTOLIN HFA) 108 (90 Base) MCG/ACT inhaler Inhale 1-2 puffs into the lungs every 6 (six) hours as needed for wheezing or shortness of breath.     aspirin EC 81 MG tablet Take 81 mg by mouth every evening.     cyclobenzaprine (FLEXERIL) 10 MG tablet Take 10 mg by mouth at bedtime as needed for muscle spasms.      diclofenac Sodium (VOLTAREN) 1 % GEL Apply 2 g topically 4 (four) times daily.     EPINEPHrine 0.3 mg/0.3 mL IJ SOAJ injection Inject 0.3 mLs (0.3 mg total) into the muscle as needed for up to 2 doses. 2 Device 0   escitalopram (LEXAPRO) 10 MG tablet Take 10 mg by mouth daily.     furosemide (LASIX) 80 MG tablet Take 80 mg by mouth daily.      glimepiride (AMARYL) 4 MG tablet  Take 4 mg by mouth daily with breakfast.      hydrALAZINE (APRESOLINE) 100 MG tablet Take 1 tablet (100 mg total) by mouth 3 (three) times daily. 270 tablet 0   leflunomide (ARAVA) 10 MG tablet Take 10 mg by mouth daily.     levothyroxine (SYNTHROID, LEVOTHROID) 200 MCG tablet Take 200 mcg by mouth daily before breakfast.      liraglutide (VICTOZA) 18 MG/3ML SOPN Inject 0.6 mg into the skin at bedtime.     metoprolol succinate (TOPROL-XL) 50 MG 24 hr tablet Take 50 mg by mouth at bedtime.     pravastatin  (PRAVACHOL) 40 MG tablet Take 40 mg by mouth every evening.     predniSONE (DELTASONE) 5 MG tablet Take 5 mg by mouth daily.     Tofacitinib Citrate (XELJANZ) 5 MG TABS Take 5 mg by mouth 2 (two) times daily.     No current facility-administered medications on file prior to visit.    PAST MEDICAL HISTORY: Past Medical History:  Diagnosis Date   Asthma    Chronic kidney disease    Diabetes mellitus without complication (HCC)    Hyperlipidemia    Hypertension    Obesity    Pituitary macroadenoma (HCC)    Rheumatoid arthritis ([place])    Sleep apnea     PAST SURGICAL HISTORY: Past Surgical History:  Procedure Laterality Date   Arthroscopic knee surgery Right 1998   Gamma knife surgery  2011   HAND SURGERY     THYROIDECTOMY     TONSILLECTOMY     TUMOR REMOVAL     Pituitary    FAMILY HISTORY: The patient's family history includes Emphysema in his father; Healthy in his brother, sister, and sister; Hypertension in his mother.   SOCIAL HISTORY:  The patient  reports that he has been smoking cigarettes. He has a 40.00 pack-year smoking history. He has never used smokeless tobacco. He reports current alcohol use. He reports previous drug use. Drug: Marijuana.  Review of Systems  Constitutional: Negative for chills and fever.  HENT: Negative for hoarse voice and nosebleeds.   Eyes: Negative for discharge, double vision and pain.  Cardiovascular: Positive for dyspnea on exertion (improved. ) and leg swelling (improving). Negative for chest pain, claudication, near-syncope, orthopnea, palpitations, paroxysmal nocturnal dyspnea and syncope.  Respiratory: Negative for hemoptysis and shortness of breath.   Musculoskeletal: Negative for muscle cramps and myalgias.  Gastrointestinal: Negative for abdominal pain, constipation, diarrhea, hematemesis, hematochezia, melena, nausea and vomiting.  Neurological: Positive for light-headedness (usually with low blood sugars.).  Negative for dizziness.    PHYSICAL EXAM: Vitals with BMI 07/14/2020 07/12/2020 07/12/2020  Height _0  - -  Weight 297 lbs - -  BMI 29.79 - -  Systolic 892 119 417  Diastolic 72 56 70  Pulse 58 52 51   CONSTITUTIONAL: Well-developed and well-nourished. No acute distress.  SKIN: Skin is warm and dry. No rash noted. No cyanosis. No pallor. No jaundice HEAD: Normocephalic and atraumatic.  EYES: No scleral icterus MOUTH/THROAT: Moist oral membranes.  NECK: No JVD present. No thyromegaly noted. No carotid bruits  LYMPHATIC: No visible cervical adenopathy.  CHEST Normal respiratory effort. No intercostal retractions  LUNGS: Decreased breath sounds bilaterally.  No stridor. No wheezes. No rales.  CARDIOVASCULAR: Regular positives S1-S2, no gallops rubs or murmurs appreciated ABDOMINAL: Obese soft, nontender, nondistended, positive bowel sounds all 4 quadrants, no apparent ascites.  EXTREMITIES: 2+ bilateral pitting peripheral edema  HEMATOLOGIC: No significant bruising NEUROLOGIC: Oriented  to person, place, and time. Nonfocal. Normal muscle tone.  PSYCHIATRIC: Normal mood and affect. Normal behavior. Cooperative  CARDIAC DATABASE: EKG: 07/14/2020: Sinus bradycardia, 57 beats per minute, incomplete right bundle branch block without underlying injury pattern.  Echocardiogram: 11/13/2012: LVEF 67%, abnormal relaxation, mild LVH, left atrium slightly dilated, trace MR.  Stress Testing:  January 2014: Myocardial perfusion imaging: Perfusion images reveal a moderate sized inferior and apical nontransmural scar without superimposed ischemia.  LVEF 61%.  Low risk study clinical correlation recommended in a patient who weighs 260 pounds and a BMI of 46.  Heart Catheterization: None  Carotid duplex: 06/22/2018: Minimal stenosis of the left external carotid artery (less than 50%) bilateral antegrade vertebral artery flow.  Lower extremity arterial duplex: 06/22/2018: Biphasic waveform in the  right proximal SFA and below suggest diffuse disease no hemodynamically significant stenosis identified in the left lower extremity.  ABI mildly reduced in the right lower extremity.  Normal ABI suggestive of normal perfusion in the left lower extremity.  LABORATORY DATA: External Labs: Collected: 07/04/2020 Creatinine 2.9 mg/dL. eGFR: 23 mL/min per 1.73 m Sodium 146, BUN 36, BUN to creatinine ratio 12.2 TSH: 0.59   Lipid profile: Collected: 05/03/2020 Total cholesterol 181, triglycerides 285, HDL 57, LDL 67, non-HDL 124  CBC Latest Ref Rng & Units 07/12/2020 07/10/2020 04/26/2008  WBC 4.0 - 10.5 K/uL 7.9 8.1 8.3  Hemoglobin 13.0 - 17.0 g/dL 12.5(L) 13.0 15.2  Hematocrit 39 - 52 % 38.5(L) 40.8 43.4  Platelets 150 - 400 K/uL 218 279 242   LABORATORY DATA: CBC Latest Ref Rng & Units 07/12/2020 07/10/2020 04/26/2008  WBC 4.0 - 10.5 K/uL 7.9 8.1 8.3  Hemoglobin 13.0 - 17.0 g/dL 12.5(L) 13.0 15.2  Hematocrit 39 - 52 % 38.5(L) 40.8 43.4  Platelets 150 - 400 K/uL 218 279 242    CMP Latest Ref Rng & Units 07/12/2020 07/10/2020 04/26/2008  Glucose 70 - 99 mg/dL 126(H) 75 127(H)  BUN 8 - 23 mg/dL 32(H) 33(H) 13  Creatinine 0.61 - 1.24 mg/dL 2.66(H) 2.71(H) 1.01  Sodium 135 - 145 mmol/L 143 140 138  Potassium 3.5 - 5.1 mmol/L 3.4(L) 3.8 3.7 DELTA CHECK NOTED  Chloride 98 - 111 mmol/L 103 102 103  CO2 22 - 32 mmol/L _0 Calcium 8.9 - 10.3 mg/dL 9.1 9.7 9.0    Lipid Panel  No results found for: CHOL, TRIG, HDL, CHOLHDL, VLDL, LDLCALC, LDLDIRECT, LABVLDL  No components found for: NTPROBNP No results for input(s): PROBNP in the last 8760 hours. Recent Labs    07/11/20 1851  TSH 0.616    HEMOGLOBIN A1C Lab Results  Component Value Date   HGBA1C 6.6 (H) 07/11/2020   MPG 142.72 07/11/2020   IMPRESSION:    ICD-10-CM   1. Benign hypertension  I10 EKG 12-Lead    isosorbide dinitrate (ISORDIL TITRADOSE) 40 MG tablet  2. Leg swelling  M79.89   3. Dyspnea on exertion  R06.00 PCV  ECHOCARDIOGRAM COMPLETE  4. Type 2 diabetes mellitus with hyperglycemia, with long-term current use of insulin (HCC)  E11.65    Z79.4   5. Type 2 diabetes mellitus with stage 4 chronic kidney disease, with long-term current use of insulin (HCC)  E11.22    N18.4    Z79.4   6. Mixed hyperlipidemia  E78.2   7. Rheumatoid arthritis, involving unspecified site, unspecified whether rheumatoid factor present ([place])  M06.9   8. Smoking  F17.200   9. Class 3 severe obesity due to excess calories with  serious comorbidity and body mass index (BMI) of 40.0 to 44.9 in adult (HCC)  E66.01    Z68.41   10. OSA on CPAP  G47.33    Z99.89      RECOMMENDATIONS: Dwayne Mendell. is a 69 y.o. male whose past medical history and cardiovascular risk factors include: Diabetes mellitus type 2, GERD, hyperlipidemia, hypertension, nonsecretory pituitary macroadenoma, rheumatoid arthritis, bronchitis, active tobacco use, advanced age, obesity due to excess calories.  Benign essential hypertension:  Home medications reconciled.  Patient is encouraged to bring his medication bottles at every office visit so accurate medication reconciliation can be performed.  Patient states that he is currently on Lasix 80 mg p.o. daily.  At the recent hospitalization losartan was discontinued due to chronic kidney disease.  Continue hydralazine 100 mg p.o. 3 times daily.  We will increase Isordil to 40 mg p.o. 3 times daily  Patient is aware of the doctor drug interactions between nitrates and ED/BPH medications as discussed at last office visit.  Repeat blood work in 1 week to evaluate kidney function.  I agree patient should follow-up with nephrology given his chronic kidney disease. Patient states that he will ask for referral to his next PCP office visit.  Re-encouraged the importance of a low-salt diet  Patient is enrolled into principal care management for ambulatory blood pressure monitoring.  I would like to  see him back in close follow-up in 2 weeks to further uptitrate medications if needed.  Lower extremity swelling: Improving  Lower extremity duplex negative for DVT.  Hyperlipidemia: Continue statin therapy.  Managed per primary team  Tobacco use: Educated on importance of complete smoking cessation.  Patient does not appear to be motivated to stop smoking.  Patient is asked to call the office if his systolic blood pressures continue to be more than 140 mmHg.  If his systolic blood pressures are consistently greater than 170-180 mmHg he is asked to seek medical attention at the closest ER via EMS.  He is also educated on symptoms of stroke and aortic dissection as these can be complications of uncontrolled hypertension.  Patient verbalized understanding.   FINAL MEDICATION LIST END OF ENCOUNTER: Meds ordered this encounter  Medications   isosorbide dinitrate (ISORDIL TITRADOSE) 40 MG tablet    Sig: Take 1 tablet (40 mg total) by mouth 3 (three) times daily.    Dispense:  90 tablet    Refill:  0      Current Outpatient Medications:    albuterol (PROVENTIL HFA;VENTOLIN HFA) 108 (90 Base) MCG/ACT inhaler, Inhale 1-2 puffs into the lungs every 6 (six) hours as needed for wheezing or shortness of breath., Disp: , Rfl:    aspirin EC 81 MG tablet, Take 81 mg by mouth every evening., Disp: , Rfl:    cyclobenzaprine (FLEXERIL) 10 MG tablet, Take 10 mg by mouth at bedtime as needed for muscle spasms. , Disp: , Rfl:    diclofenac Sodium (VOLTAREN) 1 % GEL, Apply 2 g topically 4 (four) times daily., Disp: , Rfl:    EPINEPHrine 0.3 mg/0.3 mL IJ SOAJ injection, Inject 0.3 mLs (0.3 mg total) into the muscle as needed for up to 2 doses., Disp: 2 Device, Rfl: 0   escitalopram (LEXAPRO) 10 MG tablet, Take 10 mg by mouth daily., Disp: , Rfl:    furosemide (LASIX) 80 MG tablet, Take 80 mg by mouth daily. , Disp: , Rfl:    glimepiride (AMARYL) 4 MG tablet, Take 4 mg by mouth daily  with breakfast. ,  Disp: , Rfl:    hydrALAZINE (APRESOLINE) 100 MG tablet, Take 1 tablet (100 mg total) by mouth 3 (three) times daily., Disp: 270 tablet, Rfl: 0   leflunomide (ARAVA) 10 MG tablet, Take 10 mg by mouth daily., Disp: , Rfl:    levothyroxine (SYNTHROID, LEVOTHROID) 200 MCG tablet, Take 200 mcg by mouth daily before breakfast. , Disp: , Rfl:    liraglutide (VICTOZA) 18 MG/3ML SOPN, Inject 0.6 mg into the skin at bedtime., Disp: , Rfl:    metoprolol succinate (TOPROL-XL) 50 MG 24 hr tablet, Take 50 mg by mouth at bedtime., Disp: , Rfl:    pravastatin (PRAVACHOL) 40 MG tablet, Take 40 mg by mouth every evening., Disp: , Rfl:    predniSONE (DELTASONE) 5 MG tablet, Take 5 mg by mouth daily., Disp: , Rfl:    Tofacitinib Citrate (XELJANZ) 5 MG TABS, Take 5 mg by mouth 2 (two) times daily., Disp: , Rfl:    isosorbide dinitrate (ISORDIL TITRADOSE) 40 MG tablet, Take 1 tablet (40 mg total) by mouth 3 (three) times daily., Disp: 90 tablet, Rfl: 0  Orders Placed This Encounter  Procedures   EKG 12-Lead   PCV ECHOCARDIOGRAM COMPLETE    --Continue cardiac medications as reconciled in final medication list. --Return in about 2 weeks (around 07/28/2020) for BP follow up.. Or sooner if needed. --Continue follow-up with your primary care physician regarding the management of your other chronic comorbid conditions.  Patient's questions and concerns were addressed to his satisfaction. He voices understanding of the instructions provided during this encounter.   This note was created using a voice recognition software as a result there may be grammatical errors inadvertently enclosed that do not reflect the nature of this encounter. Every attempt is made to correct such errors.  Rex Kras, [place], [facility]  Pager: [phone] Office: [phone]

## 2020-07-18 ENCOUNTER — Other Ambulatory Visit: Payer: Self-pay | Admitting: Pharmacist

## 2020-07-18 DIAGNOSIS — I10 Essential (primary) hypertension: Secondary | ICD-10-CM

## 2020-07-18 MED ORDER — ISOSORBIDE DINITRATE 30 MG PO TABS: 60.0000 mg | ORAL_TABLET | Freq: Three times a day (TID) | ORAL | 2 refills | Status: DC

## 2020-07-19 ENCOUNTER — Telehealth: Payer: Self-pay | Admitting: Pharmacist

## 2020-07-19 NOTE — Telephone Encounter (Signed)
BP reading this morning of 206/99. Called and reviewed with pt. Pt denies complains of HA, CP, SOB, neural focal deficit Reviewed and updated med list together. Pt noted to not yet haven started isordil 60 mg TID dose. BP reading improved since. Will continue to closely monitor.

## 2020-07-26 ENCOUNTER — Other Ambulatory Visit: Payer: Self-pay

## 2020-07-26 ENCOUNTER — Other Ambulatory Visit: Payer: Self-pay | Admitting: Pharmacist

## 2020-07-26 DIAGNOSIS — R0600 Dyspnea, unspecified: Secondary | ICD-10-CM

## 2020-07-26 DIAGNOSIS — R0609 Other forms of dyspnea: Secondary | ICD-10-CM

## 2020-07-26 DIAGNOSIS — I10 Essential (primary) hypertension: Secondary | ICD-10-CM

## 2020-07-26 MED ORDER — LABETALOL HCL 100 MG PO TABS: 100.0000 mg | ORAL_TABLET | Freq: Two times a day (BID) | ORAL | 0 refills | Status: DC

## 2020-07-26 NOTE — Progress Notes (Signed)
Home BP continues to be elevated. 2 week average BP: 176/84, HR: 58. BP ranging from 156- 200/70-101. Pt reports to be doing well and denies any complains of CP, SOB. Lower extremity edema symptoms improving. Complains of mild headache that has been persistent. Complains of lightheadedness at times, but pt associated it with hypoglycemia. Recent FBG reading of 56. Recommended pt schedule OV with his PCP or endocrinologist to optimize his BG management if pt continues to have hypoglycemia symptoms. Recommended pt also establish care with a nephrologist given pt's CKD hx. Pt tolerating recent isordil dose increase. BP remains elevated. Reviewed reading with Dr. Terri Skains. Per Dr. Brennan Bailey recommendations, instructed pt to decrease lasix to 40 mg daily. Switched metoprolol 50 mg to labetalol 100 mg BID. Reviewed changes with pt and pt's wife. Pt verbalized understanding. Will continue to monitor and follow remote BP readings closely.

## 2020-08-01 ENCOUNTER — Other Ambulatory Visit: Payer: PRIVATE HEALTH INSURANCE

## 2020-08-01 ENCOUNTER — Other Ambulatory Visit: Payer: Self-pay

## 2020-08-01 ENCOUNTER — Ambulatory Visit: Payer: Medicare Other

## 2020-08-01 DIAGNOSIS — R0600 Dyspnea, unspecified: Secondary | ICD-10-CM | POA: Diagnosis not present

## 2020-08-01 DIAGNOSIS — R600 Localized edema: Secondary | ICD-10-CM | POA: Diagnosis not present

## 2020-08-01 DIAGNOSIS — I10 Essential (primary) hypertension: Secondary | ICD-10-CM | POA: Diagnosis not present

## 2020-08-01 DIAGNOSIS — R0609 Other forms of dyspnea: Secondary | ICD-10-CM | POA: Diagnosis not present

## 2020-08-02 DIAGNOSIS — I10 Essential (primary) hypertension: Secondary | ICD-10-CM | POA: Diagnosis not present

## 2020-08-02 LAB — BASIC METABOLIC PANEL
BUN/Creatinine Ratio: 12 (ref 10–24)
BUN: 31 mg/dL — ABNORMAL HIGH (ref 8–27)
CO2: 23 mmol/L (ref 20–29)
Calcium: 8.9 mg/dL (ref 8.6–10.2)
Chloride: 108 mmol/L — ABNORMAL HIGH (ref 96–106)
Creatinine, Ser: 2.67 mg/dL — ABNORMAL HIGH (ref 0.76–1.27)
GFR calc Af Amer: 27 mL/min/{1.73_m2} — ABNORMAL LOW (ref >59)
GFR calc non Af Amer: 24 mL/min/{1.73_m2} — ABNORMAL LOW (ref >59)
Glucose: 87 mg/dL (ref 65–99)
Potassium: 3.8 mmol/L (ref 3.5–5.2)
Sodium: 145 mmol/L — ABNORMAL HIGH (ref 134–144)

## 2020-08-02 LAB — PRO B NATRIURETIC PEPTIDE: NT-Pro BNP: 1100 pg/mL — ABNORMAL HIGH (ref 0–376)

## 2020-08-02 LAB — MAGNESIUM: Magnesium: 2 mg/dL (ref 1.6–2.3)

## 2020-08-05 ENCOUNTER — Encounter: Payer: Self-pay | Admitting: Cardiology

## 2020-08-05 ENCOUNTER — Other Ambulatory Visit: Payer: Self-pay

## 2020-08-05 ENCOUNTER — Ambulatory Visit: Payer: Medicare Other | Admitting: Cardiology

## 2020-08-05 VITALS — BP 136/64 | HR 56 | Resp 15 | Ht 69.0 in | Wt 306.0 lb

## 2020-08-05 DIAGNOSIS — Z794 Long term (current) use of insulin: Secondary | ICD-10-CM

## 2020-08-05 DIAGNOSIS — M069 Rheumatoid arthritis, unspecified: Secondary | ICD-10-CM

## 2020-08-05 DIAGNOSIS — I10 Essential (primary) hypertension: Secondary | ICD-10-CM | POA: Diagnosis not present

## 2020-08-05 DIAGNOSIS — E782 Mixed hyperlipidemia: Secondary | ICD-10-CM

## 2020-08-05 DIAGNOSIS — R0609 Other forms of dyspnea: Secondary | ICD-10-CM

## 2020-08-05 DIAGNOSIS — G4733 Obstructive sleep apnea (adult) (pediatric): Secondary | ICD-10-CM | POA: Diagnosis not present

## 2020-08-05 DIAGNOSIS — E1165 Type 2 diabetes mellitus with hyperglycemia: Secondary | ICD-10-CM | POA: Diagnosis not present

## 2020-08-05 DIAGNOSIS — Z9989 Dependence on other enabling machines and devices: Secondary | ICD-10-CM

## 2020-08-05 DIAGNOSIS — N184 Chronic kidney disease, stage 4 (severe): Secondary | ICD-10-CM | POA: Diagnosis not present

## 2020-08-05 DIAGNOSIS — F17200 Nicotine dependence, unspecified, uncomplicated: Secondary | ICD-10-CM | POA: Diagnosis not present

## 2020-08-05 DIAGNOSIS — M7989 Other specified soft tissue disorders: Secondary | ICD-10-CM

## 2020-08-05 DIAGNOSIS — E1122 Type 2 diabetes mellitus with diabetic chronic kidney disease: Secondary | ICD-10-CM

## 2020-08-05 MED ORDER — NIFEDIPINE ER OSMOTIC RELEASE 60 MG PO TB24: 60.0000 mg | ORAL_TABLET | Freq: Every day | ORAL | 0 refills | Status: DC

## 2020-08-05 MED ORDER — LABETALOL HCL 200 MG PO TABS: 200.0000 mg | ORAL_TABLET | Freq: Two times a day (BID) | ORAL | 0 refills | Status: DC

## 2020-08-05 NOTE — Progress Notes (Signed)
[name]. Date of Birth: [birth year] MRN: [id number] Primary Care Provider:Collins, Hinton Dyer, DO Former Cardiology Providers: Dr. Adrian Prows Primary Cardiologist: Rex Kras, DO, [facility] (established care 07/07/2020)  Date: 08/05/2020 Last Office Visit: 07/07/2020  Chief Complaint  Patient presents with  . Follow-up    2 week  . Blood pressure follow up    HPI  [name]. is a 69 y.o.  male who presents to the office with a chief complaint of " follow-up for blood pressure." Patient's past medical history and cardiovascular risk factors include: Diabetes mellitus type 2, GERD, hyperlipidemia, hypertension, nonsecretory pituitary macroadenoma, rheumatoid arthritis, bronchitis, active tobacco use, advanced age, obesity due to excess calories.  Patient is referred to the office at the request of his primary care provider Dr. Theda Sers for evaluation and management of hypertension.  Patient was formally under the care of Dr. Adrian Prows and reestablish care with myself as of 07/07/2020.   Prior to establishing care with myself patient systolic blood pressures are very uncontrolled.  He used to have blood pressure readings as high as 198/87.  His medications have been uptitrated and his blood pressure at today's office visit 136/64.  Patient has also been enrolled into ambulatory blood pressure monitoring and his blood pressures have been labile but improving.  Patient's overall kidney function has remained stable with most recent serum creatinine of 2.67 mg/dL.  Patient states that he has been having episodes of hesitancy and decreased urine flow.  I was asked him to discuss this further with his PCP and possibly consider urology follow-up.  He also needs to establish care with nephrology given his underlying chronic kidney disease.  FUNCTIONAL STATUS: No structured exercise program or daily routine.  ALLERGIES: Allergies  Allergen Reactions  . Atorvastatin Other (See Comments)    unknown   . Wilder Glade [Dapagliflozin] Other (See Comments)    Yeast infection   . Folic Acid Hives  . Humira [Adalimumab] Other (See Comments)    Caused a stroke   . Metformin And Related Other (See Comments)    Weak if takes 1000 mg  . Plavix [Clopidogrel Bisulfate] Hives  . Simvastatin Other (See Comments)    Couldn't think straight     MEDICATION LIST PRIOR TO VISIT: Current Outpatient Medications on File Prior to Visit  Medication Sig Dispense Refill  . albuterol (PROVENTIL HFA;VENTOLIN HFA) 108 (90 Base) MCG/ACT inhaler Inhale 1-2 puffs into the lungs every 6 (six) hours as needed for wheezing or shortness of breath.    Marland Kitchen aspirin EC 81 MG tablet Take 81 mg by mouth every evening.     . cyclobenzaprine (FLEXERIL) 10 MG tablet Take 10 mg by mouth at bedtime as needed for muscle spasms.     . diclofenac Sodium (VOLTAREN) 1 % GEL Apply 2 g topically 4 (four) times daily.    Marland Kitchen EPINEPHrine 0.3 mg/0.3 mL IJ SOAJ injection Inject 0.3 mLs (0.3 mg total) into the muscle as needed for up to 2 doses. 2 Device 0  . escitalopram (LEXAPRO) 10 MG tablet Take 10 mg by mouth daily.    . furosemide (LASIX) 80 MG tablet Take 40 mg by mouth.     Marland Kitchen glimepiride (AMARYL) 4 MG tablet Take 4 mg by mouth daily with breakfast.     . hydrALAZINE (APRESOLINE) 100 MG tablet Take 1 tablet (100 mg total) by mouth 3 (three) times daily. 270 tablet 0  . isosorbide dinitrate (ISORDIL) 30 MG tablet Take 2 tablets (  60 mg total) by mouth 3 (three) times daily. 180 tablet 2  . leflunomide (ARAVA) 10 MG tablet Take 10 mg by mouth daily.     Marland Kitchen levothyroxine (SYNTHROID, LEVOTHROID) 200 MCG tablet Take 200 mcg by mouth daily before breakfast.     . liraglutide (VICTOZA) 18 MG/3ML SOPN Inject 0.6 mg into the skin at bedtime.    Glory Rosebush VERIO test strip 3 (three) times daily.    Marland Kitchen POTASSIUM CHLORIDE PO Take 10 mEq by mouth daily.    . pravastatin (PRAVACHOL) 40 MG tablet Take 40 mg by mouth every evening.    . predniSONE (DELTASONE)  10 MG tablet Take 10 mg by mouth as needed.    . predniSONE (DELTASONE) 5 MG tablet Take 5 mg by mouth as needed.     . Tofacitinib Citrate (XELJANZ) 5 MG TABS Take 5 mg by mouth 2 (two) times daily.     No current facility-administered medications on file prior to visit.    PAST MEDICAL HISTORY: Past Medical History:  Diagnosis Date  . Asthma   . Chronic kidney disease   . Diabetes mellitus without complication ([place])   . Hyperlipidemia   . Hypertension   . Obesity   . Pituitary macroadenoma ([place])   . Rheumatoid arthritis ([place])   . Sleep apnea     PAST SURGICAL HISTORY: Past Surgical History:  Procedure Laterality Date  . Arthroscopic knee surgery Right 1998  . Gamma knife surgery  2011  . HAND SURGERY    . THYROIDECTOMY    . TONSILLECTOMY    . TUMOR REMOVAL     Pituitary    FAMILY HISTORY: The patient's family history includes Emphysema in his father; Healthy in his brother, sister, and sister; Hypertension in his mother.   SOCIAL HISTORY:  The patient  reports that he has been smoking cigarettes. He has a 40.00 pack-year smoking history. He has never used smokeless tobacco. He reports current alcohol use. He reports previous drug use. Drug: Marijuana.  Review of Systems  Constitutional: Negative for chills and fever.  HENT: Negative for hoarse voice and nosebleeds.   Eyes: Negative for discharge, double vision and pain.  Cardiovascular: Positive for dyspnea on exertion (improved. ) and leg swelling (improving). Negative for chest pain, claudication, near-syncope, orthopnea, palpitations, paroxysmal nocturnal dyspnea and syncope.  Respiratory: Negative for hemoptysis and shortness of breath.   Musculoskeletal: Negative for muscle cramps and myalgias.  Gastrointestinal: Negative for abdominal pain, constipation, diarrhea, hematemesis, hematochezia, melena, nausea and vomiting.  Neurological: Positive for light-headedness (usually with low blood sugars.). Negative for  dizziness.    PHYSICAL EXAM: Vitals with BMI 08/05/2020 07/14/2020 07/12/2020  Height '5\' 9"'  '5\' 9"'  -  Weight 306 lbs 297 lbs -  BMI 31.54 00.86 -  Systolic 761 950 932  Diastolic 64 72 56  Pulse 56 58 52   CONSTITUTIONAL: Well-developed and well-nourished. No acute distress.  SKIN: Skin is warm and dry. No rash noted. No cyanosis. No pallor. No jaundice HEAD: Normocephalic and atraumatic.  EYES: No scleral icterus MOUTH/THROAT: Moist oral membranes.  NECK: No JVD present. No thyromegaly noted. No carotid bruits  LYMPHATIC: No visible cervical adenopathy.  CHEST Normal respiratory effort. No intercostal retractions  LUNGS: Decreased breath sounds bilaterally.  No stridor. No wheezes. No rales.  CARDIOVASCULAR: Regular positives S1-S2, no gallops rubs or murmurs appreciated ABDOMINAL: Obese soft, nontender, nondistended, positive bowel sounds all 4 quadrants, no apparent ascites.  EXTREMITIES: 1+ bilateral pitting peripheral edema  HEMATOLOGIC: No significant bruising NEUROLOGIC: Oriented to person, place, and time. Nonfocal. Normal muscle tone.  PSYCHIATRIC: Normal mood and affect. Normal behavior. Cooperative  CARDIAC DATABASE: EKG: 07/14/2020: Sinus bradycardia, 57 beats per minute, incomplete right bundle branch block without underlying injury pattern.  Echocardiogram: 08/01/2020:  Left ventricle cavity is normal in size. Mild concentric hypertrophy of the left ventricle. Normal global Tolen motion. Normal LV systolic function with visual EF 55-60%. Doppler evidence of grade II (pseudonormal)  diastolic dysfunction, elevated LAP.  Left atrial cavity is moderately dilated.  Mild (Grade I) mitral regurgitation.  Mild tricuspid regurgitation. Estimated pulmonary artery systolic pressure  34 mmHg.   Stress Testing:  January 2014: Myocardial perfusion imaging: Perfusion images reveal a moderate sized inferior and apical nontransmural scar without superimposed ischemia.  LVEF 61%.  Low  risk study clinical correlation recommended in a patient who weighs 260 pounds and a BMI of 46.  Heart Catheterization: None  Carotid duplex: 06/22/2018: Minimal stenosis of the left external carotid artery (less than 50%) bilateral antegrade vertebral artery flow.  Lower extremity arterial duplex: 06/22/2018: Biphasic waveform in the right proximal SFA and below suggest diffuse disease no hemodynamically significant stenosis identified in the left lower extremity.  ABI mildly reduced in the right lower extremity.  Normal ABI suggestive of normal perfusion in the left lower extremity.  LABORATORY DATA: External Labs: Collected: 07/04/2020 Creatinine 2.9 mg/dL. eGFR: 23 mL/min per 1.73 m Sodium 146, BUN 36, BUN to creatinine ratio 12.2 TSH: 0.59   Lipid profile: Collected: 05/03/2020 Total cholesterol 181, triglycerides 285, HDL 57, LDL 67, non-HDL 124  CBC Latest Ref Rng & Units 07/12/2020 07/10/2020 04/26/2008  WBC 4.0 - 10.5 K/uL 7.9 8.1 8.3  Hemoglobin 13.0 - 17.0 g/dL 12.5(L) 13.0 15.2  Hematocrit 39 - 52 % 38.5(L) 40.8 43.4  Platelets 150 - 400 K/uL 218 279 242   LABORATORY DATA: CBC Latest Ref Rng & Units 07/12/2020 07/10/2020 04/26/2008  WBC 4.0 - 10.5 K/uL 7.9 8.1 8.3  Hemoglobin 13.0 - 17.0 g/dL 12.5(L) 13.0 15.2  Hematocrit 39 - 52 % 38.5(L) 40.8 43.4  Platelets 150 - 400 K/uL 218 279 242    CMP Latest Ref Rng & Units 08/01/2020 07/12/2020 07/10/2020  Glucose 65 - 99 mg/dL 87 126(H) 75  BUN 8 - 27 mg/dL 31(H) 32(H) 33(H)  Creatinine 0.76 - 1.27 mg/dL 2.67(H) 2.66(H) 2.71(H)  Sodium 134 - 144 mmol/L 145(H) 143 140  Potassium 3.5 - 5.2 mmol/L 3.8 3.4(L) 3.8  Chloride 96 - 106 mmol/L 108(H) 103 102  CO2 20 - 29 mmol/L '23 29 27  ' Calcium 8.6 - 10.2 mg/dL 8.9 9.1 9.7    Lipid Panel  No results found for: CHOL, TRIG, HDL, CHOLHDL, VLDL, LDLCALC, LDLDIRECT, LABVLDL  No components found for: NTPROBNP Recent Labs    08/01/20 1039  PROBNP 1,100*   Recent Labs     07/11/20 1851  TSH 0.616    HEMOGLOBIN A1C Lab Results  Component Value Date   HGBA1C 6.6 (H) 07/11/2020   MPG 142.72 07/11/2020   IMPRESSION:    ICD-10-CM   1. Benign hypertension  I10 labetalol (NORMODYNE) 200 MG tablet    NIFEdipine (PROCARDIA XL) 60 MG 24 hr tablet    Basic metabolic panel    Magnesium    Magnesium    Basic metabolic panel  2. Dyspnea on exertion  R06.00   3. Leg swelling  M79.89   4. Type 2 diabetes mellitus with hyperglycemia, with long-term current use of  insulin ([place])  E11.65    Z79.4   5. Type 2 diabetes mellitus with stage 4 chronic kidney disease, with long-term current use of insulin ([facility])  E11.22    N18.4    Z79.4   6. Mixed hyperlipidemia  E78.2   7. Rheumatoid arthritis, involving unspecified site, unspecified whether rheumatoid factor present ([place])  M06.9   8. Smoking  F17.200   9. Class 3 severe obesity due to excess calories with serious comorbidity and body mass index (BMI) of 40.0 to 44.9 in adult ([facility])  E66.01    Z68.41   10. OSA on CPAP  G47.33    Z99.89      RECOMMENDATIONS: Casson Catena. is a 69 y.o. male whose past medical history and cardiovascular risk factors include: Diabetes mellitus type 2, GERD, hyperlipidemia, hypertension, nonsecretory pituitary macroadenoma, rheumatoid arthritis, bronchitis, active tobacco use, advanced age, obesity due to excess calories.  Benign essential hypertension:  Home medications reconciled.  At the recent hospitalization losartan was discontinued due to chronic kidney disease.  Continue hydralazine 100 mg p.o. 3 times daily.  Continue Isordil to 40 mg p.o. 3 times daily  Refill labetalol 200 mg p.o. twice daily.  We will start Procardia XL 60 mg p.o. daily.  Repeat blood work in 1 week to evaluate kidney function.  Patient should follow-up with nephrology given his chronic kidney disease. Patient states that he will ask for referral to his next PCP office visit.  Patient  recommended to discuss possible urology evaluation as well as he is experiencing hesitancy and decreased urine output.  Re-encouraged the importance of a low-salt diet  Patient is enrolled into principal care management for ambulatory blood pressure monitoring.  Lower extremity swelling: Improving  Lower extremity duplex negative for DVT.  Hyperlipidemia: Continue statin therapy.  Managed per primary team  Tobacco use: Educated on importance of complete smoking cessation.  Patient does not appear to be motivated to stop smoking.   FINAL MEDICATION LIST END OF ENCOUNTER: Meds ordered this encounter  Medications  . labetalol (NORMODYNE) 200 MG tablet    Sig: Take 1 tablet (200 mg total) by mouth 2 (two) times daily.    Dispense:  180 tablet    Refill:  0  . NIFEdipine (PROCARDIA XL) 60 MG 24 hr tablet    Sig: Take 1 tablet (60 mg total) by mouth daily.    Dispense:  30 tablet    Refill:  0      Current Outpatient Medications:  .  albuterol (PROVENTIL HFA;VENTOLIN HFA) 108 (90 Base) MCG/ACT inhaler, Inhale 1-2 puffs into the lungs every 6 (six) hours as needed for wheezing or shortness of breath., Disp: , Rfl:  .  aspirin EC 81 MG tablet, Take 81 mg by mouth every evening. , Disp: , Rfl:  .  cyclobenzaprine (FLEXERIL) 10 MG tablet, Take 10 mg by mouth at bedtime as needed for muscle spasms. , Disp: , Rfl:  .  diclofenac Sodium (VOLTAREN) 1 % GEL, Apply 2 g topically 4 (four) times daily., Disp: , Rfl:  .  EPINEPHrine 0.3 mg/0.3 mL IJ SOAJ injection, Inject 0.3 mLs (0.3 mg total) into the muscle as needed for up to 2 doses., Disp: 2 Device, Rfl: 0 .  escitalopram (LEXAPRO) 10 MG tablet, Take 10 mg by mouth daily., Disp: , Rfl:  .  furosemide (LASIX) 80 MG tablet, Take 40 mg by mouth. , Disp: , Rfl:  .  glimepiride (AMARYL) 4 MG tablet, Take 4  mg by mouth daily with breakfast. , Disp: , Rfl:  .  hydrALAZINE (APRESOLINE) 100 MG tablet, Take 1 tablet (100 mg total) by mouth 3 (three)  times daily., Disp: 270 tablet, Rfl: 0 .  isosorbide dinitrate (ISORDIL) 30 MG tablet, Take 2 tablets (60 mg total) by mouth 3 (three) times daily., Disp: 180 tablet, Rfl: 2 .  leflunomide (ARAVA) 10 MG tablet, Take 10 mg by mouth daily. , Disp: , Rfl:  .  levothyroxine (SYNTHROID, LEVOTHROID) 200 MCG tablet, Take 200 mcg by mouth daily before breakfast. , Disp: , Rfl:  .  liraglutide (VICTOZA) 18 MG/3ML SOPN, Inject 0.6 mg into the skin at bedtime., Disp: , Rfl:  .  ONETOUCH VERIO test strip, 3 (three) times daily., Disp: , Rfl:  .  POTASSIUM CHLORIDE PO, Take 10 mEq by mouth daily., Disp: , Rfl:  .  pravastatin (PRAVACHOL) 40 MG tablet, Take 40 mg by mouth every evening., Disp: , Rfl:  .  predniSONE (DELTASONE) 10 MG tablet, Take 10 mg by mouth as needed., Disp: , Rfl:  .  predniSONE (DELTASONE) 5 MG tablet, Take 5 mg by mouth as needed. , Disp: , Rfl:  .  Tofacitinib Citrate (XELJANZ) 5 MG TABS, Take 5 mg by mouth 2 (two) times daily., Disp: , Rfl:  .  labetalol (NORMODYNE) 200 MG tablet, Take 1 tablet (200 mg total) by mouth 2 (two) times daily., Disp: 180 tablet, Rfl: 0 .  NIFEdipine (PROCARDIA XL) 60 MG 24 hr tablet, Take 1 tablet (60 mg total) by mouth daily., Disp: 30 tablet, Rfl: 0  Orders Placed This Encounter  Procedures  . Basic metabolic panel  . Magnesium    --Continue cardiac medications as reconciled in final medication list. --Return in about 5 weeks (around 09/08/2020) for BP, followup. labs on or after 09/04/2020. Or sooner if needed. --Continue follow-up with your primary care physician regarding the management of your other chronic comorbid conditions.  Patient's questions and concerns were addressed to his satisfaction. He voices understanding of the instructions provided during this encounter.   This note was created using a voice recognition software as a result there may be grammatical errors inadvertently enclosed that do not reflect the nature of this encounter.  Every attempt is made to correct such errors.  Rex Kras, [place], [facility]  Pager: [phone] Office: [phone]

## 2020-08-05 NOTE — Patient Instructions (Signed)
Labs on or after 09/04/2020.

## 2020-08-09 DIAGNOSIS — N400 Enlarged prostate without lower urinary tract symptoms: Secondary | ICD-10-CM | POA: Diagnosis not present

## 2020-08-09 DIAGNOSIS — I129 Hypertensive chronic kidney disease with stage 1 through stage 4 chronic kidney disease, or unspecified chronic kidney disease: Secondary | ICD-10-CM | POA: Diagnosis not present

## 2020-08-09 DIAGNOSIS — E1122 Type 2 diabetes mellitus with diabetic chronic kidney disease: Secondary | ICD-10-CM | POA: Diagnosis not present

## 2020-08-09 DIAGNOSIS — J441 Chronic obstructive pulmonary disease with (acute) exacerbation: Secondary | ICD-10-CM | POA: Diagnosis not present

## 2020-08-09 DIAGNOSIS — Z09 Encounter for follow-up examination after completed treatment for conditions other than malignant neoplasm: Secondary | ICD-10-CM | POA: Diagnosis not present

## 2020-08-09 DIAGNOSIS — N184 Chronic kidney disease, stage 4 (severe): Secondary | ICD-10-CM | POA: Diagnosis not present

## 2020-08-15 ENCOUNTER — Other Ambulatory Visit: Payer: Self-pay

## 2020-08-15 ENCOUNTER — Observation Stay (HOSPITAL_COMMUNITY): Payer: Medicare Other

## 2020-08-15 ENCOUNTER — Inpatient Hospital Stay (HOSPITAL_COMMUNITY)
Admission: EM | Admit: 2020-08-15 | Discharge: 2020-08-17 | DRG: 637 | Disposition: A | Discharge status: Home / Self Care | Payer: Medicare Other | Attending: Internal Medicine | Admitting: Internal Medicine

## 2020-08-15 ENCOUNTER — Encounter (HOSPITAL_COMMUNITY): Payer: Self-pay | Admitting: Emergency Medicine

## 2020-08-15 DIAGNOSIS — D352 Benign neoplasm of pituitary gland: Secondary | ICD-10-CM | POA: Diagnosis present

## 2020-08-15 DIAGNOSIS — Z6841 Body Mass Index (BMI) 40.0 and over, adult: Secondary | ICD-10-CM | POA: Diagnosis not present

## 2020-08-15 DIAGNOSIS — N179 Acute kidney failure, unspecified: Secondary | ICD-10-CM | POA: Diagnosis present

## 2020-08-15 DIAGNOSIS — R404 Transient alteration of awareness: Secondary | ICD-10-CM | POA: Diagnosis not present

## 2020-08-15 DIAGNOSIS — R410 Disorientation, unspecified: Secondary | ICD-10-CM | POA: Diagnosis not present

## 2020-08-15 DIAGNOSIS — K625 Hemorrhage of anus and rectum: Secondary | ICD-10-CM | POA: Diagnosis not present

## 2020-08-15 DIAGNOSIS — M069 Rheumatoid arthritis, unspecified: Secondary | ICD-10-CM | POA: Diagnosis present

## 2020-08-15 DIAGNOSIS — E1122 Type 2 diabetes mellitus with diabetic chronic kidney disease: Secondary | ICD-10-CM | POA: Diagnosis present

## 2020-08-15 DIAGNOSIS — D631 Anemia in chronic kidney disease: Secondary | ICD-10-CM | POA: Diagnosis present

## 2020-08-15 DIAGNOSIS — K922 Gastrointestinal hemorrhage, unspecified: Secondary | ICD-10-CM | POA: Diagnosis present

## 2020-08-15 DIAGNOSIS — J45909 Unspecified asthma, uncomplicated: Secondary | ICD-10-CM | POA: Diagnosis present

## 2020-08-15 DIAGNOSIS — M7989 Other specified soft tissue disorders: Secondary | ICD-10-CM | POA: Diagnosis present

## 2020-08-15 DIAGNOSIS — Z825 Family history of asthma and other chronic lower respiratory diseases: Secondary | ICD-10-CM

## 2020-08-15 DIAGNOSIS — E11649 Type 2 diabetes mellitus with hypoglycemia without coma: Secondary | ICD-10-CM | POA: Diagnosis present

## 2020-08-15 DIAGNOSIS — J410 Simple chronic bronchitis: Secondary | ICD-10-CM | POA: Diagnosis present

## 2020-08-15 DIAGNOSIS — R0689 Other abnormalities of breathing: Secondary | ICD-10-CM | POA: Diagnosis not present

## 2020-08-15 DIAGNOSIS — Z888 Allergy status to other drugs, medicaments and biological substances status: Secondary | ICD-10-CM | POA: Diagnosis not present

## 2020-08-15 DIAGNOSIS — E785 Hyperlipidemia, unspecified: Secondary | ICD-10-CM | POA: Diagnosis present

## 2020-08-15 DIAGNOSIS — J189 Pneumonia, unspecified organism: Secondary | ICD-10-CM | POA: Diagnosis present

## 2020-08-15 DIAGNOSIS — E162 Hypoglycemia, unspecified: Secondary | ICD-10-CM | POA: Diagnosis not present

## 2020-08-15 DIAGNOSIS — F17210 Nicotine dependence, cigarettes, uncomplicated: Secondary | ICD-10-CM | POA: Diagnosis present

## 2020-08-15 DIAGNOSIS — J209 Acute bronchitis, unspecified: Secondary | ICD-10-CM | POA: Diagnosis present

## 2020-08-15 DIAGNOSIS — I517 Cardiomegaly: Secondary | ICD-10-CM | POA: Diagnosis not present

## 2020-08-15 DIAGNOSIS — R0602 Shortness of breath: Secondary | ICD-10-CM | POA: Diagnosis not present

## 2020-08-15 DIAGNOSIS — D649 Anemia, unspecified: Secondary | ICD-10-CM | POA: Diagnosis not present

## 2020-08-15 DIAGNOSIS — I130 Hypertensive heart and chronic kidney disease with heart failure and stage 1 through stage 4 chronic kidney disease, or unspecified chronic kidney disease: Secondary | ICD-10-CM | POA: Diagnosis present

## 2020-08-15 DIAGNOSIS — I160 Hypertensive urgency: Secondary | ICD-10-CM | POA: Diagnosis present

## 2020-08-15 DIAGNOSIS — I5033 Acute on chronic diastolic (congestive) heart failure: Secondary | ICD-10-CM | POA: Diagnosis present

## 2020-08-15 DIAGNOSIS — R55 Syncope and collapse: Secondary | ICD-10-CM | POA: Diagnosis present

## 2020-08-15 DIAGNOSIS — R4182 Altered mental status, unspecified: Secondary | ICD-10-CM | POA: Diagnosis not present

## 2020-08-15 DIAGNOSIS — G9341 Metabolic encephalopathy: Secondary | ICD-10-CM | POA: Diagnosis not present

## 2020-08-15 DIAGNOSIS — Z8249 Family history of ischemic heart disease and other diseases of the circulatory system: Secondary | ICD-10-CM

## 2020-08-15 DIAGNOSIS — Z9989 Dependence on other enabling machines and devices: Secondary | ICD-10-CM

## 2020-08-15 DIAGNOSIS — G4733 Obstructive sleep apnea (adult) (pediatric): Secondary | ICD-10-CM

## 2020-08-15 DIAGNOSIS — R319 Hematuria, unspecified: Secondary | ICD-10-CM | POA: Diagnosis present

## 2020-08-15 DIAGNOSIS — Z79890 Hormone replacement therapy (postmenopausal): Secondary | ICD-10-CM

## 2020-08-15 DIAGNOSIS — E890 Postprocedural hypothyroidism: Secondary | ICD-10-CM | POA: Diagnosis present

## 2020-08-15 DIAGNOSIS — Z720 Tobacco use: Secondary | ICD-10-CM | POA: Diagnosis not present

## 2020-08-15 DIAGNOSIS — J40 Bronchitis, not specified as acute or chronic: Secondary | ICD-10-CM | POA: Diagnosis not present

## 2020-08-15 DIAGNOSIS — R9431 Abnormal electrocardiogram [ECG] [EKG]: Secondary | ICD-10-CM | POA: Diagnosis present

## 2020-08-15 DIAGNOSIS — Z23 Encounter for immunization: Secondary | ICD-10-CM | POA: Diagnosis not present

## 2020-08-15 DIAGNOSIS — N184 Chronic kidney disease, stage 4 (severe): Secondary | ICD-10-CM | POA: Diagnosis not present

## 2020-08-15 DIAGNOSIS — E876 Hypokalemia: Secondary | ICD-10-CM | POA: Diagnosis present

## 2020-08-15 DIAGNOSIS — Z20822 Contact with and (suspected) exposure to covid-19: Secondary | ICD-10-CM | POA: Diagnosis present

## 2020-08-15 DIAGNOSIS — K648 Other hemorrhoids: Secondary | ICD-10-CM | POA: Diagnosis present

## 2020-08-15 DIAGNOSIS — I10 Essential (primary) hypertension: Secondary | ICD-10-CM | POA: Diagnosis not present

## 2020-08-15 DIAGNOSIS — Z7982 Long term (current) use of aspirin: Secondary | ICD-10-CM

## 2020-08-15 DIAGNOSIS — Z9111 Patient's noncompliance with dietary regimen: Secondary | ICD-10-CM

## 2020-08-15 DIAGNOSIS — Z79899 Other long term (current) drug therapy: Secondary | ICD-10-CM

## 2020-08-15 DIAGNOSIS — R4020 Unspecified coma: Secondary | ICD-10-CM | POA: Diagnosis not present

## 2020-08-15 HISTORY — DX: Hypothyroidism, unspecified: E03.9

## 2020-08-15 LAB — CBC
HCT: 37.3 % — ABNORMAL LOW (ref 39.0–52.0)
Hemoglobin: 11.6 g/dL — ABNORMAL LOW (ref 13.0–17.0)
MCH: 29.4 pg (ref 26.0–34.0)
MCHC: 31.1 g/dL (ref 30.0–36.0)
MCV: 94.7 fL (ref 80.0–100.0)
Platelets: 234 10*3/uL (ref 150–400)
RBC: 3.94 MIL/uL — ABNORMAL LOW (ref 4.22–5.81)
RDW: 14.9 % (ref 11.5–15.5)
WBC: 6.8 10*3/uL (ref 4.0–10.5)
nRBC: 0 % (ref 0.0–0.2)

## 2020-08-15 LAB — APTT: aPTT: 30 seconds (ref 24–36)

## 2020-08-15 LAB — COMPREHENSIVE METABOLIC PANEL
ALT: 16 U/L (ref 0–44)
AST: 24 U/L (ref 15–41)
Albumin: 3 g/dL — ABNORMAL LOW (ref 3.5–5.0)
Alkaline Phosphatase: 55 U/L (ref 38–126)
Anion gap: 9 (ref 5–15)
BUN: 29 mg/dL — ABNORMAL HIGH (ref 8–23)
CO2: 26 mmol/L (ref 22–32)
Calcium: 8.9 mg/dL (ref 8.9–10.3)
Chloride: 108 mmol/L (ref 98–111)
Creatinine, Ser: 2.79 mg/dL — ABNORMAL HIGH (ref 0.61–1.24)
GFR calc Af Amer: 26 mL/min — ABNORMAL LOW (ref >60)
GFR calc non Af Amer: 22 mL/min — ABNORMAL LOW (ref >60)
Glucose, Bld: 78 mg/dL (ref 70–99)
Potassium: 3.3 mmol/L — ABNORMAL LOW (ref 3.5–5.1)
Sodium: 143 mmol/L (ref 135–145)
Total Bilirubin: 0.4 mg/dL (ref 0.3–1.2)
Total Protein: 6.1 g/dL — ABNORMAL LOW (ref 6.5–8.1)

## 2020-08-15 LAB — URINALYSIS, ROUTINE W REFLEX MICROSCOPIC
Bilirubin Urine: NEGATIVE
Glucose, UA: 50 mg/dL — AB
Hgb urine dipstick: NEGATIVE
Ketones, ur: NEGATIVE mg/dL
Leukocytes,Ua: NEGATIVE
Nitrite: NEGATIVE
Protein, ur: >=300 mg/dL — AB
Specific Gravity, Urine: 1.009 (ref 1.005–1.030)
pH: 5 (ref 5.0–8.0)

## 2020-08-15 LAB — POC OCCULT BLOOD, ED: Fecal Occult Bld: NEGATIVE

## 2020-08-15 LAB — CBG MONITORING, ED
Glucose-Capillary: 74 mg/dL (ref 70–99)
Glucose-Capillary: 57 mg/dL — ABNORMAL LOW (ref 70–99)
Glucose-Capillary: 134 mg/dL — ABNORMAL HIGH (ref 70–99)
Glucose-Capillary: 153 mg/dL — ABNORMAL HIGH (ref 70–99)

## 2020-08-15 LAB — STREP PNEUMONIAE URINARY ANTIGEN: Strep Pneumo Urinary Antigen: NEGATIVE

## 2020-08-15 LAB — HEMOGLOBIN AND HEMATOCRIT, BLOOD
HCT: 35.3 % — ABNORMAL LOW (ref 39.0–52.0)
Hemoglobin: 11.3 g/dL — ABNORMAL LOW (ref 13.0–17.0)

## 2020-08-15 LAB — GLUCOSE, CAPILLARY: Glucose-Capillary: 174 mg/dL — ABNORMAL HIGH (ref 70–99)

## 2020-08-15 LAB — SARS CORONAVIRUS 2 BY RT PCR (HOSPITAL ORDER, PERFORMED IN ~~LOC~~ HOSPITAL LAB): SARS Coronavirus 2: NEGATIVE

## 2020-08-15 LAB — TYPE AND SCREEN
ABO/RH(D): O POS
Antibody Screen: NEGATIVE

## 2020-08-15 LAB — LACTIC ACID, PLASMA: Lactic Acid, Venous: 1 mmol/L (ref 0.5–1.9)

## 2020-08-15 LAB — ABO/RH: ABO/RH(D): O POS

## 2020-08-15 LAB — PROCALCITONIN: Procalcitonin: <0.1 ng/mL

## 2020-08-15 LAB — PROTIME-INR
INR: 1.2 (ref 0.8–1.2)
Prothrombin Time: 14.2 seconds (ref 11.4–15.2)

## 2020-08-15 LAB — BRAIN NATRIURETIC PEPTIDE: B Natriuretic Peptide: 282.7 pg/mL — ABNORMAL HIGH (ref 0.0–100.0)

## 2020-08-15 LAB — TROPONIN I (HIGH SENSITIVITY): Troponin I (High Sensitivity): 37 ng/L — ABNORMAL HIGH (ref <18)

## 2020-08-15 MED ORDER — HYDRALAZINE HCL 25 MG PO TABS
100.0000 mg | ORAL_TABLET | Freq: Three times a day (TID) | ORAL | Status: DC
  Administered 2020-08-15 – 2020-08-17 (×6): 100 mg via ORAL
  Filled 2020-08-15 (×8): qty 4

## 2020-08-15 MED ORDER — SODIUM CHLORIDE 0.9 % IV SOLN
100.0000 mg | Freq: Two times a day (BID) | INTRAVENOUS | Status: DC
  Administered 2020-08-15 – 2020-08-16 (×2): 100 mg via INTRAVENOUS
  Filled 2020-08-15 (×3): qty 100

## 2020-08-15 MED ORDER — DICLOFENAC SODIUM 1 % EX GEL
2.0000 g | Freq: Four times a day (QID) | CUTANEOUS | Status: DC | PRN
  Filled 2020-08-15: qty 100

## 2020-08-15 MED ORDER — TOFACITINIB CITRATE 5 MG PO TABS: 5.0000 mg | ORAL_TABLET | Freq: Two times a day (BID) | ORAL | Status: DC

## 2020-08-15 MED ORDER — NICOTINE 21 MG/24HR TD PT24
21.0000 mg | MEDICATED_PATCH | Freq: Every day | TRANSDERMAL | Status: DC
  Administered 2020-08-15 – 2020-08-17 (×3): 21 mg via TRANSDERMAL
  Filled 2020-08-15 (×3): qty 1

## 2020-08-15 MED ORDER — LEVOTHYROXINE SODIUM 100 MCG PO TABS
200.0000 ug | ORAL_TABLET | Freq: Every day | ORAL | Status: DC
  Administered 2020-08-16 – 2020-08-17 (×2): 200 ug via ORAL
  Filled 2020-08-15 (×2): qty 2

## 2020-08-15 MED ORDER — METHYLPREDNISOLONE SODIUM SUCC 125 MG IJ SOLR
125.0000 mg | Freq: Once | INTRAMUSCULAR | Status: AC
  Administered 2020-08-15: 125 mg via INTRAVENOUS
  Filled 2020-08-15: qty 2

## 2020-08-15 MED ORDER — ALBUTEROL SULFATE (2.5 MG/3ML) 0.083% IN NEBU
2.5000 mg | INHALATION_SOLUTION | Freq: Four times a day (QID) | RESPIRATORY_TRACT | Status: DC | PRN
  Administered 2020-08-15: 2.5 mg via RESPIRATORY_TRACT
  Filled 2020-08-15: qty 3

## 2020-08-15 MED ORDER — POTASSIUM CHLORIDE CRYS ER 20 MEQ PO TBCR
40.0000 meq | EXTENDED_RELEASE_TABLET | Freq: Once | ORAL | Status: AC
  Administered 2020-08-15: 40 meq via ORAL
  Filled 2020-08-15: qty 2

## 2020-08-15 MED ORDER — DOXAZOSIN MESYLATE 2 MG PO TABS
2.0000 mg | ORAL_TABLET | Freq: Two times a day (BID) | ORAL | Status: DC
  Administered 2020-08-15 – 2020-08-17 (×4): 2 mg via ORAL
  Filled 2020-08-15 (×4): qty 1

## 2020-08-15 MED ORDER — GUAIFENESIN ER 600 MG PO TB12
600.0000 mg | ORAL_TABLET | Freq: Two times a day (BID) | ORAL | Status: DC
  Administered 2020-08-15 – 2020-08-17 (×4): 600 mg via ORAL
  Filled 2020-08-15 (×4): qty 1

## 2020-08-15 MED ORDER — ASPIRIN EC 81 MG PO TBEC
81.0000 mg | DELAYED_RELEASE_TABLET | Freq: Every evening | ORAL | Status: DC
  Administered 2020-08-15 – 2020-08-16 (×2): 81 mg via ORAL
  Filled 2020-08-15 (×2): qty 1

## 2020-08-15 MED ORDER — SODIUM CHLORIDE 0.9 % IV SOLN
2.0000 g | INTRAVENOUS | Status: DC
  Administered 2020-08-15: 2 g via INTRAVENOUS
  Filled 2020-08-15: qty 20
  Filled 2020-08-15: qty 2

## 2020-08-15 MED ORDER — LEFLUNOMIDE 20 MG PO TABS
10.0000 mg | ORAL_TABLET | Freq: Every day | ORAL | Status: DC
  Administered 2020-08-15 – 2020-08-17 (×3): 10 mg via ORAL
  Filled 2020-08-15 (×3): qty 0.5

## 2020-08-15 MED ORDER — ISOSORBIDE DINITRATE 30 MG PO TABS
60.0000 mg | ORAL_TABLET | Freq: Three times a day (TID) | ORAL | Status: DC
  Administered 2020-08-15 – 2020-08-17 (×6): 60 mg via ORAL
  Filled 2020-08-15 (×9): qty 2

## 2020-08-15 MED ORDER — ESCITALOPRAM OXALATE 10 MG PO TABS: 10.0000 mg | ORAL_TABLET | Freq: Every day | ORAL | Status: DC

## 2020-08-15 MED ORDER — ALBUTEROL SULFATE (2.5 MG/3ML) 0.083% IN NEBU
2.5000 mg | INHALATION_SOLUTION | Freq: Four times a day (QID) | RESPIRATORY_TRACT | Status: DC
  Administered 2020-08-15 – 2020-08-16 (×3): 2.5 mg via RESPIRATORY_TRACT
  Filled 2020-08-15 (×4): qty 3

## 2020-08-15 MED ORDER — PREDNISONE 20 MG PO TABS
40.0000 mg | ORAL_TABLET | Freq: Every day | ORAL | Status: DC
  Administered 2020-08-16: 40 mg via ORAL
  Filled 2020-08-15: qty 2

## 2020-08-15 MED ORDER — ALUM & MAG HYDROXIDE-SIMETH 200-200-20 MG/5ML PO SUSP
30.0000 mL | Freq: Once | ORAL | Status: AC
  Administered 2020-08-15: 30 mL via ORAL
  Filled 2020-08-15: qty 30

## 2020-08-15 MED ORDER — FUROSEMIDE 10 MG/ML IJ SOLN
40.0000 mg | Freq: Two times a day (BID) | INTRAMUSCULAR | Status: AC
  Administered 2020-08-15 – 2020-08-16 (×2): 40 mg via INTRAVENOUS
  Filled 2020-08-15 (×2): qty 4

## 2020-08-15 MED ORDER — PRAVASTATIN SODIUM 40 MG PO TABS
40.0000 mg | ORAL_TABLET | Freq: Every evening | ORAL | Status: DC
  Administered 2020-08-15 – 2020-08-16 (×2): 40 mg via ORAL
  Filled 2020-08-15 (×2): qty 1

## 2020-08-15 MED ORDER — ACETAMINOPHEN 325 MG PO TABS: 650.0000 mg | ORAL_TABLET | Freq: Four times a day (QID) | ORAL | Status: DC | PRN

## 2020-08-15 MED ORDER — HYDRALAZINE HCL 20 MG/ML IJ SOLN: 10.0000 mg | INTRAMUSCULAR | Status: DC | PRN

## 2020-08-15 MED ORDER — NIFEDIPINE ER OSMOTIC RELEASE 60 MG PO TB24
60.0000 mg | ORAL_TABLET | Freq: Every day | ORAL | Status: DC
  Administered 2020-08-15 – 2020-08-17 (×3): 60 mg via ORAL
  Filled 2020-08-15 (×4): qty 1

## 2020-08-15 MED ORDER — DEXTROSE 50 % IV SOLN
50.0000 mL | Freq: Once | INTRAVENOUS | Status: AC
  Administered 2020-08-15: 50 mL via INTRAVENOUS
  Filled 2020-08-15: qty 50

## 2020-08-15 MED ORDER — SODIUM CHLORIDE 0.9% FLUSH
3.0000 mL | Freq: Two times a day (BID) | INTRAVENOUS | Status: DC
  Administered 2020-08-15 – 2020-08-17 (×5): 3 mL via INTRAVENOUS

## 2020-08-15 MED ORDER — LABETALOL HCL 200 MG PO TABS
200.0000 mg | ORAL_TABLET | Freq: Two times a day (BID) | ORAL | Status: DC
  Administered 2020-08-15 – 2020-08-17 (×5): 200 mg via ORAL
  Filled 2020-08-15 (×5): qty 1

## 2020-08-15 MED ORDER — DEXTROSE 50 % IV SOLN: 1.0000 | INTRAVENOUS | Status: DC | PRN

## 2020-08-15 MED ORDER — DEXTROSE-NACL 5-0.45 % IV SOLN: INTRAVENOUS | Status: DC

## 2020-08-15 MED ORDER — ACETAMINOPHEN 650 MG RE SUPP: 650.0000 mg | Freq: Four times a day (QID) | RECTAL | Status: DC | PRN

## 2020-08-15 NOTE — ED Notes (Signed)
Pt using own inhaler due to shortness of breath

## 2020-08-15 NOTE — ED Triage Notes (Signed)
To ED via [employer]- from home, wife called, found husband on commode- had been there approx 2 hours- when EMS arrived, pt was gazing off, unresponsive to verbal stimuli. CBG = 43, received D5W x 500 cc - repeat CBG =80.  On arrival to ED CBG=- 74, Alert/oriented X 4, does not remember event,

## 2020-08-15 NOTE — Consult Note (Signed)
Referring Provider:  [facility]         Primary Care Physician:  [name], DO Primary Gastroenterologist: Althia Forts           We were asked to see this patient for: Rectal bleeding                ASSESSMENT / PLAN:   # Syncopal event / ? Rectal bleeding.   --Interestingly, stools are heme negative, brown stool in vault on EDP exam and patient says he thinks bleeding was from urinary tract, no bowels.  --Hemodynamically stable --At this point he doesn't seem to be having a GI bleed.   # Normocytic anemia.  Hemoglobin 11.6, down from 13 in early August 2021 --Hemoglobin down 1.5 g.  --No evidence for GI bleeding at this point.   #CKD 4 --Renal function near baseline with Cr of 2.7.   # Hypertensive urgency    HPI:                                                                                                                             Chief Complaint: Rectal bleeding  [name]. is a 69 y.o. male with a pmh significant for, not necessarily limited to: Hypertension, hyperlipidemia, CKD4 hypothyroidism /thyroidectomy,  DM2, rheumatoid arthritis, pituitary macroadenoma.   Patient brought to the ED by EMS this a.m..  Wife apparently found him sitting on the toilet, unresponsive. We were called to see for rectal bleeding but patient says he feels like the blood was coming from his urinary tract. The blood was on the front aspect of inner toilet bowel.  He was recently having a hard time initiating urinary flow but that has improved since resumption of what sounds like doxazosin . He has had some loose stool over the last couple weeks but attributes it to recent medication changes. No abdominal pain, N/V or other GI symptoms.    In the ED his BP is elevated, HR in 60's. . WBC 6.9, hgb 11.6 ( 13 early April 2021), MCV 94, Cr 2.79, Albumin 3 LFTs o/w normal, K+ 3.3   PREVIOUS ENDOSCOPIC EVALUATIONS / PERTINENT STUDIES January 2005 colonoscopy for rectal  bleeding --Internal hemorrhoids  Cologuard Dec 2020 -Negative  Past Medical History:  Diagnosis Date  . Asthma   . Chronic kidney disease   . Diabetes mellitus without complication ([place])   . Hyperlipidemia   . Hypertension   . Hypothyroidism   . Obesity   . Pituitary macroadenoma ([place])   . Rheumatoid arthritis ([place])   . Sleep apnea     Past Surgical History:  Procedure Laterality Date  . Arthroscopic knee surgery Right 1998  . Gamma knife surgery  2011  . HAND SURGERY    . THYROIDECTOMY    . TONSILLECTOMY    . TUMOR REMOVAL     Pituitary    Prior to Admission medications   Medication Sig Start Date End Date  Taking? Authorizing Provider  acetaminophen (TYLENOL) 500 MG tablet Take 1,000 mg by mouth every 6 (six) hours as needed for mild pain.   Yes [provider]  albuterol (PROVENTIL HFA;VENTOLIN HFA) 108 (90 Base) MCG/ACT inhaler Inhale 1-2 puffs into the lungs every 6 (six) hours as needed for wheezing or shortness of breath.   Yes [provider]  aspirin EC 81 MG tablet Take 81 mg by mouth every evening.    Yes [provider]  cyclobenzaprine (FLEXERIL) 10 MG tablet Take 10 mg by mouth at bedtime as needed for muscle spasms.    Yes [provider]  diclofenac Sodium (VOLTAREN) 1 % GEL Apply 2 g topically 4 (four) times daily as needed (pain).    Yes [provider]  doxazosin (CARDURA) 2 MG tablet Take 2 mg by mouth 2 (two) times daily.    Yes [provider]  EPINEPHrine 0.3 mg/0.3 mL IJ SOAJ injection Inject 0.3 mLs (0.3 mg total) into the muscle as needed for up to 2 doses. 08/06/18  Yes Irven Baltimore, MD  escitalopram (LEXAPRO) 10 MG tablet Take 10 mg by mouth daily.   Yes [provider]  furosemide (LASIX) 80 MG tablet Take 40 mg by mouth 2 (two) times daily.  06/18/20  Yes [provider]  glimepiride (AMARYL) 4 MG tablet Take 4 mg by mouth daily with breakfast.    Yes [provider]  hydrALAZINE (APRESOLINE) 100 MG tablet Take 1 tablet (100 mg total) by mouth 3 (three) times daily. 07/12/20 10/10/20 Yes [place] Eric J, DO  isosorbide dinitrate (ISORDIL) 30 MG tablet Take 2 tablets (60 mg total) by mouth 3 (three) times daily. 07/18/20  Yes Tolia, Sunit, DO  labetalol (NORMODYNE) 200 MG tablet Take 1 tablet (200 mg total) by mouth 2 (two) times daily. 08/05/20 11/03/20 Yes Tolia, Sunit, DO  leflunomide (ARAVA) 10 MG tablet Take 10 mg by mouth daily.  06/25/20  Yes [provider]  levothyroxine (SYNTHROID, LEVOTHROID) 200 MCG tablet Take 200 mcg by mouth daily before breakfast.    Yes [provider]  liraglutide (VICTOZA) 18 MG/3ML SOPN Inject 0.6 mg into the skin every other day. At bedtime   Yes [provider]  NIFEdipine (PROCARDIA XL) 60 MG 24 hr tablet Take 1 tablet (60 mg total) by mouth daily. 08/05/20 09/04/20 Yes Tolia, Sunit, DO  POTASSIUM CHLORIDE PO Take 10 mEq by mouth daily.   Yes [provider]  pravastatin (PRAVACHOL) 40 MG tablet Take 40 mg by mouth every evening.   Yes [provider]  predniSONE (DELTASONE) 5 MG tablet Take 5 mg by mouth as needed (arthritis).  06/02/20  Yes [provider]  Tofacitinib Citrate (XELJANZ) 5 MG TABS Take 5 mg by mouth 2 (two) times daily.   Yes [provider]  [facility] VERIO test strip 3 (three) times daily. 07/23/20   [provider]  predniSONE (DELTASONE) 10 MG tablet Take 10 mg by mouth as needed. Patient not taking: Reported on 08/15/2020    [provider]    Current Facility-Administered Medications  Medication Dose Route Frequency Provider Last Rate Last Admin  . acetaminophen (TYLENOL) tablet 650 mg  650 mg Oral Q6H PRN Fuller Plan A, MD       Or  . acetaminophen (TYLENOL) suppository 650 mg  650 mg Rectal Q6H PRN Smith, Rondell A, MD      . albuterol (PROVENTIL) (2.5 MG/3ML) 0.083% nebulizer solution 2.5 mg  2.5 mg Nebulization Q6H PRN  Fuller Plan A, MD      . alum & mag hydroxide-simeth (MAALOX/MYLANTA) 200-200-20 MG/5ML suspension 30 mL  30 mL Oral Once Tyrone Nine, Dan, DO      . dextrose 5 %-0.45 % sodium chloride infusion   Intravenous Continuous Fuller Plan A, MD 75 mL/hr at 08/15/20 1053 New Bag at 08/15/20 1053  . hydrALAZINE (APRESOLINE) injection 10 mg  10 mg Intravenous Q4H PRN Fuller Plan A, MD      . hydrALAZINE (APRESOLINE) tablet 100 mg  100 mg Oral TID Deno Etienne, DO      . labetalol (NORMODYNE) tablet 200 mg  200 mg Oral BID Deno Etienne, DO      . NIFEdipine (PROCARDIA XL/NIFEDICAL XL) 24 hr tablet 60 mg  60 mg Oral Daily Tyrone Nine, Dan, DO      . potassium chloride SA (KLOR-CON) CR tablet 40 mEq  40 mEq Oral Once Tyrone Nine, Dan, DO      . sodium chloride flush (NS) 0.9 % injection 3 mL  3 mL Intravenous Q12H Norval Morton, MD       Current Outpatient Medications  Medication Sig Dispense Refill  . acetaminophen (TYLENOL) 500 MG tablet Take 1,000 mg by mouth every 6 (six) hours as needed for mild pain.    Marland Kitchen albuterol (PROVENTIL HFA;VENTOLIN HFA) 108 (90 Base) MCG/ACT inhaler Inhale 1-2 puffs into the lungs every 6 (six) hours as needed for wheezing or shortness of breath.    Marland Kitchen aspirin EC 81 MG tablet Take 81 mg by mouth every evening.     . cyclobenzaprine (FLEXERIL) 10 MG tablet Take 10 mg by mouth at bedtime as needed for muscle spasms.     . diclofenac Sodium (VOLTAREN) 1 % GEL Apply 2 g topically 4 (four) times daily as needed (pain).     Marland Kitchen doxazosin (CARDURA) 2 MG tablet Take 2 mg by mouth 2 (two) times daily.     Marland Kitchen EPINEPHrine 0.3 mg/0.3 mL IJ SOAJ injection Inject 0.3 mLs (0.3 mg total) into the muscle as needed for up to 2 doses. 2 Device 0  . escitalopram (LEXAPRO) 10 MG tablet Take 10 mg by mouth daily.    . furosemide (LASIX) 80 MG tablet Take 40 mg by mouth 2 (two) times daily.     Marland Kitchen glimepiride (AMARYL) 4 MG tablet Take 4 mg by mouth daily with breakfast.     . hydrALAZINE (APRESOLINE) 100 MG tablet  Take 1 tablet (100 mg total) by mouth 3 (three) times daily. 270 tablet 0  . isosorbide dinitrate (ISORDIL) 30 MG tablet Take 2 tablets (60 mg total) by mouth 3 (three) times daily. 180 tablet 2  . labetalol (NORMODYNE) 200 MG tablet Take 1 tablet (200 mg total) by mouth 2 (two) times daily. 180 tablet 0  . leflunomide (ARAVA) 10 MG tablet Take 10 mg by mouth daily.     Marland Kitchen levothyroxine (SYNTHROID, LEVOTHROID) 200 MCG tablet Take 200 mcg by mouth daily before breakfast.     . liraglutide (VICTOZA) 18 MG/3ML SOPN Inject 0.6 mg into the skin every other day. At bedtime    . NIFEdipine (PROCARDIA XL) 60 MG 24 hr tablet Take 1 tablet (60 mg total) by mouth daily. 30 tablet 0  . POTASSIUM CHLORIDE PO Take 10 mEq by mouth daily.    . pravastatin (PRAVACHOL) 40 MG tablet Take 40 mg by mouth every evening.    . predniSONE (DELTASONE) 5 MG tablet Take 5  mg by mouth as needed (arthritis).     . Tofacitinib Citrate (XELJANZ) 5 MG TABS Take 5 mg by mouth 2 (two) times daily.    Glory Rosebush VERIO test strip 3 (three) times daily.    . predniSONE (DELTASONE) 10 MG tablet Take 10 mg by mouth as needed. (Patient not taking: Reported on 08/15/2020)      Allergies as of 08/15/2020 - Review Complete 08/15/2020  Allergen Reaction Noted  . Atorvastatin Other (See Comments) 08/05/2018  . Wilder Glade [dapagliflozin] Other (See Comments) 08/05/2018  . Folic acid Hives 16/12/930  . Humira [adalimumab] Other (See Comments) 08/05/2018  . Metformin and related Other (See Comments) 08/05/2018  . Plavix [clopidogrel bisulfate] Hives 08/05/2018  . Simvastatin Other (See Comments) 08/05/2018    Family History  Problem Relation Age of Onset  . Hypertension Mother   . Emphysema Father   . Healthy Sister   . Healthy Brother   . Healthy Sister     Social History   Socioeconomic History  . Marital status: Married    Spouse name: Not on file  . Number of children: 2  . Years of education: Not on file  . Highest  education level: Not on file  Occupational History  . Not on file  Tobacco Use  . Smoking status: Current Every Day Smoker    Packs/day: 1.00    Years: 40.00    Pack years: 40.00    Types: Cigarettes  . Smokeless tobacco: Never Used  Vaping Use  . Vaping Use: Never used  Substance and Sexual Activity  . Alcohol use: Yes    Comment: occasionally  . Drug use: Not Currently    Types: Marijuana  . Sexual activity: Not on file  Other Topics Concern  . Not on file  Social History Narrative  . Not on file   Social Determinants of Health   Financial Resource Strain:   . Difficulty of Paying Living Expenses: Not on file  Food Insecurity:   . Worried About Charity fundraiser in the Last Year: Not on file  . Ran Out of Food in the Last Year: Not on file  Transportation Needs:   . Lack of Transportation (Medical): Not on file  . Lack of Transportation (Non-Medical): Not on file  Physical Activity:   . Days of Exercise per Week: Not on file  . Minutes of Exercise per Session: Not on file  Stress:   . Feeling of Stress : Not on file  Social Connections:   . Frequency of Communication with Friends and Family: Not on file  . Frequency of Social Gatherings with Friends and Family: Not on file  . Attends Religious Services: Not on file  . Active Member of Clubs or Organizations: Not on file  . Attends Archivist Meetings: Not on file  . Marital Status: Not on file  Intimate Partner Violence:   . Fear of Current or Ex-Partner: Not on file  . Emotionally Abused: Not on file  . Physically Abused: Not on file  . Sexually Abused: Not on file    Review of Systems: All systems reviewed and negative except where noted in HPI.  PREVIOUS ENDOSCOPIC STUDIES / IMAGING:    Cologuard negative  - Dec 2020    OBJECTIVE:    Physical Exam: Vital signs in last 24 hours: Temp:  [97.3 F (36.3 C)] 97.3 F (36.3 C) (09/13 0911) Pulse Rate:  [55-65] 63 (09/13 1010) Resp:   [15-16] 15 (09/13  1010) BP: (182-194)/(59-70) 194/70 (09/13 1010) SpO2:  [98 %-100 %] 98 % (09/13 1010) Weight:  [136.1 kg] 136.1 kg (09/13 0912)   General:   Alert, obese white male in NAD Psych:  Pleasant, cooperative. Normal mood and affect. Eyes:  Pupils equal, sclera clear, no icterus.   Conjunctiva pink. Ears:  Normal auditory acuity. Nose:  No deformity, discharge,  or lesions. Neck:  Supple; no masses Lungs:  Clear throughout to auscultation.   No wheezes, crackles, or rhonchi.  Heart:  Regular rate and rhythm; no murmurs, no lower extremity edema Abdomen:  Soft, non-distended, nontender, BS active, no palp mass   Rectal:  Deferred. Soft, brown stool in vault on EDP exam Msk:  Symmetrical without gross deformities. . Neurologic:  Alert and  oriented x4;  grossly normal neurologically. Skin:  Intact without significant lesions or rashes.  Filed Weights   08/15/20 0912  Weight: 136.1 kg     Scheduled inpatient medications . alum & mag hydroxide-simeth  30 mL Oral Once  . hydrALAZINE  100 mg Oral TID  . labetalol  200 mg Oral BID  . NIFEdipine  60 mg Oral Daily  . potassium chloride SA  40 mEq Oral Once  . sodium chloride flush  3 mL Intravenous Q12H      Intake/Output from previous day: No intake/output data recorded. Intake/Output this shift: No intake/output data recorded.   Lab Results: Recent Labs    08/15/20 0913  WBC 6.8  HGB 11.6*  HCT 37.3*  PLT 234   BMET Recent Labs    08/15/20 0913  NA 143  K 3.3*  CL 108  CO2 26  GLUCOSE 78  BUN 29*  CREATININE 2.79*  CALCIUM 8.9   LFT Recent Labs    08/15/20 0913  PROT 6.1*  ALBUMIN 3.0*  AST 24  ALT 16  ALKPHOS 55  BILITOT 0.4   PT/INR No results for input(s): LABPROT, INR in the last 72 hours. Hepatitis Panel No results for input(s): HEPBSAG, HCVAB, HEPAIGM, HEPBIGM in the last 72 hours.   . CBC Latest Ref Rng & Units 08/15/2020 07/12/2020 07/10/2020  WBC 4.0 - 10.5 K/uL 6.8 7.9 8.1    Hemoglobin 13.0 - 17.0 g/dL 11.6(L) 12.5(L) 13.0  Hematocrit 39 - 52 % 37.3(L) 38.5(L) 40.8  Platelets 150 - 400 K/uL 234 218 279    . CMP Latest Ref Rng & Units 08/15/2020 08/01/2020 07/12/2020  Glucose 70 - 99 mg/dL 78 87 126(H)  BUN 8 - 23 mg/dL 29(H) 31(H) 32(H)  Creatinine 0.61 - 1.24 mg/dL 2.79(H) 2.67(H) 2.66(H)  Sodium 135 - 145 mmol/L 143 145(H) 143  Potassium 3.5 - 5.1 mmol/L 3.3(L) 3.8 3.4(L)  Chloride 98 - 111 mmol/L 108 108(H) 103  CO2 22 - 32 mmol/L 26 23 29   Calcium 8.9 - 10.3 mg/dL 8.9 8.9 9.1  Total Protein 6.5 - 8.1 g/dL 6.1(L) - -  Total Bilirubin 0.3 - 1.2 mg/dL 0.4 - -  Alkaline Phos 38 - 126 U/L 55 - -  AST 15 - 41 U/L 24 - -  ALT 0 - 44 U/L 16 - -   Studies/Results: DG Chest Port 1 View  Result Date: 08/15/2020 CLINICAL DATA:  Shortness of breath EXAM: PORTABLE CHEST 1 VIEW COMPARISON:  July 10, 2020 FINDINGS: There is slight interstitial thickening in the bases. There is an area of ill-defined opacity in the right upper lobe. The lungs elsewhere are clear. There is mild cardiomegaly with pulmonary vascularity normal. No adenopathy.  No bone lesions. Surgical clips are noted in the left cervical-thoracic region, likely due to hemithyroidectomy. IMPRESSION: 1. Ill-defined opacity right upper lobe concerning for early developing pneumonia right upper lobe. Bibasilar interstitial thickening is stable and may reflect a degree of underlying chronic bronchitis. There is cardiomegaly. Pulmonary vascularity is normal. No adenopathy. Electronically Signed   By: Lowella Grip III M.D.   On: 08/15/2020 10:48    Active Problems:   GI bleed    Tye Savoy, NP-C @  08/15/2020, 12:41 PM

## 2020-08-15 NOTE — H&P (Addendum)
History and Physical    [name]. ENI:[id number] DOB: Apr 14, 1951 DOA: 08/15/2020  Referring MD/NP/PA: Deno Etienne, MD PCP: [name], DO  Patient coming from: Home via EMS  Chief Complaint: Altered  I have personally briefly reviewed patient's old medical records in [place]   HPI: [name]. is a 69 y.o. male with medical history significant of hypertension, hyperlipidemia, hypothyroidism, diabetes mellitus type 2, pituitary macroadenoma, rheumatoid arthritis presented after being found acutely altered this morning.  His wife noted that he went to use the restroom and she found him 2 hours later still sitting on the commode.  He was not responding like normal and appeared to be in a daze.  He never fell off the commode or lost consciousness to her knowledge.  Patient notes that he had recently been told to cut down to juice wound Amaryl pill daily as there have been times in which he would be out in the yard and forget to eat and his sugars would drop.  Over the last couple weeks patient noted he had been having worsening of his lower extremity swelling and difficulty breathing intermittently.  Noted associated symptoms of intermittent cough and wheezing.  In route with EMS patient's initial blood glucose was noted to be 43 and he received D5W x500 mL fluid with repeat CBG 80.  He reported having bright red blood coming from his penis, but was not sure if he sustained any trauma while he was on the toilet seat.  Currently denies having any dysuria symptoms   ED Course: Upon admission into the emergency department patient was seen to be afebrile, pulse 55-60, blood pressures 182/59, and all other vital signs maintained.  Labs significant for hemoglobin 11.6 (previously 12.5 last month), potassium 3.3, BUN 29, creatinine 2.79, and glucose 79->53.  There was initially concern for a GI bleed, but stool guaiacs were noted to be negative. Patient had been given 40 mEq of  potassium chloride p.o., and restarted on home blood pressure medications.  [facility] have been formally consulted.  TRH called to admit.  COVID-19 screening was negative.  Review of Systems  Constitutional: Negative for fever and malaise/fatigue.  HENT: Negative for ear discharge and nosebleeds.   Eyes: Negative for pain.  Respiratory: Positive for cough, shortness of breath and wheezing.   Cardiovascular: Positive for leg swelling. Negative for chest pain.  Gastrointestinal: Negative for abdominal pain, blood in stool and vomiting.  Genitourinary: Positive for hematuria. Negative for urgency.  Musculoskeletal: Positive for joint pain. Negative for falls.  Skin: Negative for itching.  Neurological: Negative for focal weakness and loss of consciousness.  Endo/Heme/Allergies: Negative for polydipsia.  Psychiatric/Behavioral: Positive for substance abuse.    Past Medical History:  Diagnosis Date  . Asthma   . Chronic kidney disease   . Diabetes mellitus without complication ([place])   . Hyperlipidemia   . Hypertension   . Obesity   . Pituitary macroadenoma ([place])   . Rheumatoid arthritis ([place])   . Sleep apnea     Past Surgical History:  Procedure Laterality Date  . Arthroscopic knee surgery Right 1998  . Gamma knife surgery  2011  . HAND SURGERY    . THYROIDECTOMY    . TONSILLECTOMY    . TUMOR REMOVAL     Pituitary     reports that he has been smoking cigarettes. He has a 40.00 pack-year smoking history. He has never used smokeless tobacco. He reports current alcohol use. He  reports previous drug use. Drug: Marijuana.  Allergies  Allergen Reactions  . Atorvastatin Other (See Comments)    unknown  . Wilder Glade [Dapagliflozin] Other (See Comments)    Yeast infection   . Folic Acid Hives  . Humira [Adalimumab] Other (See Comments)    Caused a stroke   . Metformin And Related Other (See Comments)    Weak if takes 1000 mg  . Plavix [Clopidogrel Bisulfate] Hives  .  Simvastatin Other (See Comments)    Couldn't think straight     Family History  Problem Relation Age of Onset  . Hypertension Mother   . Emphysema Father   . Healthy Sister   . Healthy Brother   . Healthy Sister     Prior to Admission medications   Medication Sig Start Date End Date Taking? Authorizing Provider  albuterol (PROVENTIL HFA;VENTOLIN HFA) 108 (90 Base) MCG/ACT inhaler Inhale 1-2 puffs into the lungs every 6 (six) hours as needed for wheezing or shortness of breath.    [provider]  aspirin EC 81 MG tablet Take 81 mg by mouth every evening.     [provider]  cyclobenzaprine (FLEXERIL) 10 MG tablet Take 10 mg by mouth at bedtime as needed for muscle spasms.     [provider]  diclofenac Sodium (VOLTAREN) 1 % GEL Apply 2 g topically 4 (four) times daily.    [provider]  EPINEPHrine 0.3 mg/0.3 mL IJ SOAJ injection Inject 0.3 mLs (0.3 mg total) into the muscle as needed for up to 2 doses. 08/06/18   Irven Baltimore, MD  escitalopram (LEXAPRO) 10 MG tablet Take 10 mg by mouth daily.    [provider]  furosemide (LASIX) 80 MG tablet Take 40 mg by mouth.  06/18/20   [provider]  glimepiride (AMARYL) 4 MG tablet Take 4 mg by mouth daily with breakfast.     [provider]  hydrALAZINE (APRESOLINE) 100 MG tablet Take 1 tablet (100 mg total) by mouth 3 (three) times daily. 07/12/20 10/10/20  [place] Eric J, DO  isosorbide dinitrate (ISORDIL) 30 MG tablet Take 2 tablets (60 mg total) by mouth 3 (three) times daily. 07/18/20   Tolia, Sunit, DO  labetalol (NORMODYNE) 200 MG tablet Take 1 tablet (200 mg total) by mouth 2 (two) times daily. 08/05/20 11/03/20  Tolia, Sunit, DO  leflunomide (ARAVA) 10 MG tablet Take 10 mg by mouth daily.  06/25/20   [provider]  levothyroxine (SYNTHROID, LEVOTHROID) 200 MCG tablet Take 200 mcg by mouth daily before breakfast.     [provider]  liraglutide (VICTOZA)  18 MG/3ML SOPN Inject 0.6 mg into the skin at bedtime.    [provider]  NIFEdipine (PROCARDIA XL) 60 MG 24 hr tablet Take 1 tablet (60 mg total) by mouth daily. 08/05/20 09/04/20  Tolia, Sunit, DO  ONETOUCH VERIO test strip 3 (three) times daily. 07/23/20   [provider]  POTASSIUM CHLORIDE PO Take 10 mEq by mouth daily.    [provider]  pravastatin (PRAVACHOL) 40 MG tablet Take 40 mg by mouth every evening.    [provider]  predniSONE (DELTASONE) 10 MG tablet Take 10 mg by mouth as needed.    [provider]  predniSONE (DELTASONE) 5 MG tablet Take 5 mg by mouth as needed.  06/02/20   [provider]  Tofacitinib Citrate (XELJANZ) 5 MG TABS Take 5 mg by mouth 2 (two) times daily.    [provider]    Physical Exam:  Constitutional: Morbidly obese male who appears to be in no acute distress. Vitals:   08/15/20 0904 08/15/20 0909 08/15/20 0911 08/15/20 0912  BP: (!) 182/59  (!) 182/59   Pulse: 60  (!) 55   Resp: 16  15   Temp: (!) 97.3 F (36.3 C)  (!) 97.3 F (36.3 C)   TempSrc: Oral  Oral   SpO2: 99% 100% 99%   Weight:    136.1 kg  Height:    5\' 9"  (1.753 m)   Eyes: PERRL, lids and conjunctivae normal ENMT: Mucous membranes are moist. Posterior pharynx clear of any exudate or lesions.Normal dentition.  Neck: normal, supple, no masses, no thyromegaly Respiratory: clear to auscultation bilaterally, no wheezing, no crackles. Normal respiratory effort. No accessory muscle use.  Cardiovascular: Regular rate and rhythm, no murmurs / rubs / gallops. No extremity edema. 2+ pedal pulses. No carotid bruits.  Abdomen: no tenderness, no masses palpated. No hepatosplenomegaly. Bowel sounds positive.  Musculoskeletal: no clubbing / cyanosis. No joint deformity upper and lower extremities. Good ROM, no contractures. Normal muscle tone.  Skin: no rashes, lesions, ulcers. No induration Neurologic: CN 2-12 grossly intact. Sensation  intact, DTR normal. Strength 5/5 in all 4.  Psychiatric: Normal judgment and insight. Alert and oriented x 3. Normal mood.     Labs on Admission: I have personally reviewed following labs and imaging studies  CBC: Recent Labs  Lab 08/15/20 0913  WBC 6.8  HGB 11.6*  HCT 37.3*  MCV 94.7  PLT 924   Basic Metabolic Panel: Recent Labs  Lab 08/15/20 0913  NA 143  K 3.3*  CL 108  CO2 26  GLUCOSE 78  BUN 29*  CREATININE 2.79*  CALCIUM 8.9   GFR: Estimated Creatinine Clearance: 35.2 mL/min (A) (by C-G formula based on SCr of 2.79 mg/dL (H)). Liver Function Tests: Recent Labs  Lab 08/15/20 0913  AST 24  ALT 16  ALKPHOS 55  BILITOT 0.4  PROT 6.1*  ALBUMIN 3.0*   No results for input(s): LIPASE, AMYLASE in the last 168 hours. No results for input(s): AMMONIA in the last 168 hours. Coagulation Profile: No results for input(s): INR, PROTIME in the last 168 hours. Cardiac Enzymes: No results for input(s): CKTOTAL, CKMB, CKMBINDEX, TROPONINI in the last 168 hours. BNP (last 3 results) Recent Labs    08/01/20 1039  PROBNP 1,100*   HbA1C: No results for input(s): HGBA1C in the last 72 hours. CBG: Recent Labs  Lab 08/15/20 0900  GLUCAP 74   Lipid Profile: No results for input(s): CHOL, HDL, LDLCALC, TRIG, CHOLHDL, LDLDIRECT in the last 72 hours. Thyroid Function Tests: No results for input(s): TSH, T4TOTAL, FREET4, T3FREE, THYROIDAB in the last 72 hours. Anemia Panel: No results for input(s): VITAMINB12, FOLATE, FERRITIN, TIBC, IRON, RETICCTPCT in the last 72 hours. Urine analysis: No results found for: COLORURINE, APPEARANCEUR, LABSPEC, PHURINE, GLUCOSEU, HGBUR, BILIRUBINUR, KETONESUR, PROTEINUR, UROBILINOGEN, NITRITE, LEUKOCYTESUR Sepsis Labs: No results found for this or any previous visit (from the past 240 hour(s)).   Radiological Exams on Admission: DG Chest Port 1 View  Result Date: 08/15/2020 CLINICAL DATA:  Shortness of breath EXAM: PORTABLE CHEST 1  VIEW COMPARISON:  July 10, 2020 FINDINGS: There is slight interstitial thickening in the bases. There is an area of ill-defined opacity in the right upper lobe. The lungs elsewhere are clear. There is mild cardiomegaly with pulmonary vascularity normal. No adenopathy. No bone lesions. Surgical clips are noted in the  left cervical-thoracic region, likely due to hemithyroidectomy. IMPRESSION: 1. Ill-defined opacity right upper lobe concerning for early developing pneumonia right upper lobe. Bibasilar interstitial thickening is stable and may reflect a degree of underlying chronic bronchitis. There is cardiomegaly. Pulmonary vascularity is normal. No adenopathy. Electronically Signed   By: Lowella Grip III M.D.   On: 08/15/2020 10:48    EKG: Independently reviewed.  Sinus rhythm at 58 bpm with QTc 511.  Assessment/Plan Acute metabolic encephalopathy secondary to diabetes mellitus type 2 with hypoglycemia: Wife found the patient on the commode after being there for 2 hours dazing off and unresponsive.  Initial CBG was 43 which he was given D5W with temporary improvement in blood sugars.:  The hospital repeat blood sugars dropped as low as 57.  -Admit to medical telemetry bed -Hypoglycemic protocols -Hold Amaryl, but would recommend discontinuing -Held Victoza -CBGs every 4 hours -D5-0.45% NS IV fluids at 75 mL/h initially started, but discontinued due to lower extremity swelling. -Amps of D50 as needed for low blood sugars -Discontinue IV fluids once blood sugars greater than 200 for at least 2 consecutive checks 4 hours apart.  Community-acquired pneumonia: Acute.  Patient noted to have cough.  Found to have a ill-defined opacity of the right upper lung concerning for developing pneumonia.  However, given smoking history on the patient's differential should include possibility of malignancy. -Check pro calcitonin -Check sputum cultures and studies -Empiric antibiotics of Rocephin and  doxycycline -Check CT without contrast  Bronchitis: Acute on chronic.Marland Kitchen  Patient noted to have wheezing on physical exam.  Chest x-ray also noted interstitial thickening concerning for chronic bronchitis. -Solu-Medrol 125 mg mg IV x1 dose, then start prednisone 40 mg daily in a.m. -DuoNebs 4 times daily -Mucinex  Hypertensive urgency: Acute.  On admission patient blood pressure elevated up to 194/70.  Patient is on several different medications for blood pressure control at home. -Continue home blood pressure medication regimen -Hydralazine IV as needed for elevated blood pressure greater than 180.  Diastolic heart failure exacerbation: Acute on chronic.  Patient reports worsening lower extremity swelling.  Noted to have 2+ pitting edema on bilateral lower extremities on physical exam.  Last EF was noted to be 55 to 60% with grade 2 diastolic dysfunction.  BNP on 8/30 was 1100. -Strict intake and output -Daily weights -Check BNP -Lasix 40 mg IV twice daily x2 doses -Will need to reassess and determine if patient needs further IV diuresis or patient can be restarted on home regimen  Hypokalemia: Acute.  Potassium 3.3 on admission.  Patient had been given 40 mEq of potassium chloride p.o. in the emergency department -Continue to monitor and replace as needed  Prolonged QT interval: QTc 511 on admission. -Correct electrolyte abnormality -Hold QT prolonging medications like Lexapro and Zofran -Recheck EKG in a.m.  Normocytic anemia Hematuria: Acute.  Hemoglobin 11.6->11.3 g/dL on admission.  GI had been initially consulted with reports of possible GI bleed.  However, stool guaiacs were noted to be negative, patient denies any blood in stools, and therefore ruled out.  Patient reported to have hematuria, but urine appears clear at this time.  Question of possibility of trauma as a cause of symptoms.   -Follow-up urinalysis  GI bleed: Ruled out  Chronic kidney disease stage IV: Creatinine  2.79 which appears near patient's baseline. -Continue to monitor kidney function with IV diuresis.  Hypothyroidism: Last TSH was noted to be 0.616 on 07/11/2020.  Patient on levothyroxine 200 mcg with breakfast. -Continue levothyroxine  Rheumatoid arthritis: Patient had been on methotrexate previously, but this was discontinued about 6 or more months ago.  Currently on leflunomide 10 mg daily and  tofacitinib 5 mg bid. -Continue current home regimen  Morbid obesity: BMI 44 .3 kg/m  OSA on CPAP -Continue CPAP at night  Tobacco abuse: Patient reports smoking a pack of cigarettes per day on average. -Nicotine patch -Continue counseling on the need of cessation of tobacco use DVT prophylaxis: Lovenox Code Status: Full  Family Communication: Wife updated at bedside Disposition Plan: Possible discharge home in 1 to 2 days Consults called:  GI  Admission status: Inpatient requiring more than 2 midnight stay to evaluate and treat concern for pneumonia with bronchitis Norval Morton MD [facility] Pager [phone]   If 7PM-7AM, please contact night-coverage [email or web address] Password [facility]  08/15/2020, 10:25 AM

## 2020-08-15 NOTE — ED Provider Notes (Signed)
[facility] EMERGENCY DEPARTMENT Provider Note   CSN: [id number] Arrival date & time: 08/15/20  0857     History Chief Complaint  Patient presents with  . Loss of Consciousness  . GI Bleeding    Derran Sear. is a 69 y.o. male.  69 yo  M with a chief complaints of bright red blood per rectum and a syncopal event.  States he had 1 bowel movements that was bright and bloody.  Does not think it was that much and then had resolved.  He had to go to the bathroom to urinate later that morning and next thing he remembers is waking up with EMS standing around him.  His wife said that he had gone to the toilet and then she checked on him and realized that he was very sweaty and pale.  Not responding to to stimuli so she called EMS.  Denies abdominal pain denies fevers.  Denies chest pain or shortness of breath denies headache or neck pain.  The history is provided by the patient.  Loss of Consciousness Episode history:  Single Most recent episode:  Today Duration:  5 minutes Timing:  Rare Progression:  Resolved Chronicity:  New Context: urination   Witnessed: yes   Relieved by:  Nothing Worsened by:  Nothing Ineffective treatments:  None tried Associated symptoms: no chest pain, no confusion, no fever, no headaches, no palpitations, no shortness of breath and no vomiting        Past Medical History:  Diagnosis Date  . Asthma   . Chronic kidney disease   . Diabetes mellitus without complication ([place])   . Hyperlipidemia   . Hypertension   . Hypothyroidism   . Obesity   . Pituitary macroadenoma ([place])   . Rheumatoid arthritis ([place])   . Sleep apnea     Patient Active Problem List   Diagnosis Date Noted  . GI bleed 08/15/2020  . OSA on CPAP 07/11/2020  . Stage 4 chronic kidney disease ([place]) 07/11/2020  . Rheumatoid arthritis ([place])   . Hyperlipidemia   . Diabetes mellitus without complication ([place])   . OBESITY-MORBID (>100') 11/24/2008  .  OVERWEIGHT/OBESITY 11/24/2008  . PALPITATIONS 11/24/2008    Past Surgical History:  Procedure Laterality Date  . Arthroscopic knee surgery Right 1998  . Gamma knife surgery  2011  . HAND SURGERY    . THYROIDECTOMY    . TONSILLECTOMY    . TUMOR REMOVAL     Pituitary       Family History  Problem Relation Age of Onset  . Hypertension Mother   . Emphysema Father   . Healthy Sister   . Healthy Brother   . Healthy Sister     Social History   Tobacco Use  . Smoking status: Current Every Day Smoker    Packs/day: 1.00    Years: 40.00    Pack years: 40.00    Types: Cigarettes  . Smokeless tobacco: Never Used  Vaping Use  . Vaping Use: Never used  Substance Use Topics  . Alcohol use: Yes    Comment: occasionally  . Drug use: Not Currently    Types: Marijuana    Home Medications Prior to Admission medications   Medication Sig Start Date End Date Taking? Authorizing Provider  acetaminophen (TYLENOL) 500 MG tablet Take 1,000 mg by mouth every 6 (six) hours as needed for mild pain.   Yes [provider]  albuterol (PROVENTIL HFA;VENTOLIN HFA) 108 (90 Base) MCG/ACT inhaler  Inhale 1-2 puffs into the lungs every 6 (six) hours as needed for wheezing or shortness of breath.   Yes [provider]  aspirin EC 81 MG tablet Take 81 mg by mouth every evening.    Yes [provider]  cyclobenzaprine (FLEXERIL) 10 MG tablet Take 10 mg by mouth at bedtime as needed for muscle spasms.    Yes [provider]  diclofenac Sodium (VOLTAREN) 1 % GEL Apply 2 g topically 4 (four) times daily as needed (pain).    Yes [provider]  doxazosin (CARDURA) 2 MG tablet Take 2 mg by mouth 2 (two) times daily.    Yes [provider]  EPINEPHrine 0.3 mg/0.3 mL IJ SOAJ injection Inject 0.3 mLs (0.3 mg total) into the muscle as needed for up to 2 doses. 08/06/18  Yes Irven [place], MD  escitalopram (LEXAPRO) 10 MG tablet Take 10 mg by mouth daily.   Yes  [provider]  furosemide (LASIX) 80 MG tablet Take 40 mg by mouth 2 (two) times daily.  06/18/20  Yes [provider]  glimepiride (AMARYL) 4 MG tablet Take 4 mg by mouth daily with breakfast.    Yes [provider]  hydrALAZINE (APRESOLINE) 100 MG tablet Take 1 tablet (100 mg total) by mouth 3 (three) times daily. 07/12/20 10/10/20 Yes [place] Eric J, DO  isosorbide dinitrate (ISORDIL) 30 MG tablet Take 2 tablets (60 mg total) by mouth 3 (three) times daily. 07/18/20  Yes Tolia, Sunit, DO  labetalol (NORMODYNE) 200 MG tablet Take 1 tablet (200 mg total) by mouth 2 (two) times daily. 08/05/20 11/03/20 Yes Tolia, Sunit, DO  leflunomide (ARAVA) 10 MG tablet Take 10 mg by mouth daily.  06/25/20  Yes [provider]  levothyroxine (SYNTHROID, LEVOTHROID) 200 MCG tablet Take 200 mcg by mouth daily before breakfast.    Yes [provider]  liraglutide (VICTOZA) 18 MG/3ML SOPN Inject 0.6 mg into the skin every other day. At bedtime   Yes [provider]  NIFEdipine (PROCARDIA XL) 60 MG 24 hr tablet Take 1 tablet (60 mg total) by mouth daily. 08/05/20 09/04/20 Yes Tolia, Sunit, DO  POTASSIUM CHLORIDE PO Take 10 mEq by mouth daily.   Yes [provider]  pravastatin (PRAVACHOL) 40 MG tablet Take 40 mg by mouth every evening.   Yes [provider]  predniSONE (DELTASONE) 5 MG tablet Take 5 mg by mouth as needed (arthritis).  06/02/20  Yes [provider]  Tofacitinib Citrate (XELJANZ) 5 MG TABS Take 5 mg by mouth 2 (two) times daily.   Yes [provider]  [facility] VERIO test strip 3 (three) times daily. 07/23/20   [provider]  predniSONE (DELTASONE) 10 MG tablet Take 10 mg by mouth as needed. Patient not taking: Reported on 08/15/2020    [provider]    Allergies    Atorvastatin, Farxiga [dapagliflozin], Folic acid, Humira [adalimumab], Metformin and related, Plavix [clopidogrel bisulfate], and  Simvastatin  Review of Systems   Review of Systems  Constitutional: Negative for chills and fever.  HENT: Negative for congestion and facial swelling.   Eyes: Negative for discharge and visual disturbance.  Respiratory: Negative for shortness of breath.   Cardiovascular: Positive for syncope. Negative for chest pain and palpitations.  Gastrointestinal: Positive for blood in stool. Negative for abdominal pain, diarrhea and vomiting.  Musculoskeletal: Negative for arthralgias and myalgias.  Skin: Negative for color change and rash.  Neurological: Negative for tremors, syncope and headaches.  Psychiatric/Behavioral: Negative for confusion and dysphoric mood.    Physical Exam Updated Vital Signs BP (!) 175/63   Pulse 60   Temp (!) 97.3 F (36.3 C) (Oral)   Resp 17   Ht 5\' 9"  (1.753 m)   Wt 136.1 kg   SpO2 97%   BMI 44.30 kg/m   Physical Exam Vitals and nursing note reviewed.  Constitutional:      Appearance: He is well-developed.  HENT:     Head: Normocephalic and atraumatic.  Eyes:     Pupils: Pupils are equal, round, and reactive to light.  Neck:     Vascular: No JVD.  Cardiovascular:     Rate and Rhythm: Normal rate and regular rhythm.     Heart sounds: No murmur heard.  No friction rub. No gallop.   Pulmonary:     Effort: No respiratory distress.     Breath sounds: No wheezing.  Abdominal:     General: There is no distension.     Tenderness: There is no guarding or rebound.  Genitourinary:    Comments: No hemorrhoids no fissures, soft brown stool in the vault. Musculoskeletal:        General: Normal range of motion.     Cervical back: Normal range of motion and neck supple.  Skin:    Coloration: Skin is not pale.     Findings: No rash.  Neurological:     Mental Status: He is alert and oriented to person, place, and time.  Psychiatric:        Behavior: Behavior normal.     ED Results / Procedures / Treatments   Labs (all labs ordered are listed, but  only abnormal results are displayed) Labs Reviewed  COMPREHENSIVE METABOLIC PANEL - Abnormal; Notable for the following components:      Result Value   Potassium 3.3 (*)    BUN 29 (*)    Creatinine, Ser 2.79 (*)    Total Protein 6.1 (*)    Albumin 3.0 (*)    GFR calc non Af Amer 22 (*)    GFR calc Af Amer 26 (*)    All other components within normal limits  CBC - Abnormal; Notable for the following components:   RBC 3.94 (*)    Hemoglobin 11.6 (*)    HCT 37.3 (*)    All other components within normal limits  CBG MONITORING, ED - Abnormal; Notable for the following components:   Glucose-Capillary 57 (*)    All other components within normal limits  CBG MONITORING, ED - Abnormal; Notable for the following components:   Glucose-Capillary 134 (*)    All other components within normal limits  SARS CORONAVIRUS 2 BY RT PCR (HOSPITAL ORDER, [facility])  LACTIC ACID, PLASMA  PROTIME-INR  APTT  HEMOGLOBIN AND HEMATOCRIT, BLOOD  CBG MONITORING, ED  POC OCCULT BLOOD, ED  TYPE AND SCREEN  ABO/RH  TROPONIN I (HIGH SENSITIVITY)    EKG EKG Interpretation  Date/Time:  Monday August 15 2020 09:01:05 EDT Ventricular Rate:  58 PR Interval:    QRS Duration: 126 QT Interval:  520 QTC Calculation: 511 R Axis:   69 Text Interpretation: Sinus rhythm Nonspecific intraventricular conduction delay Borderline repolarization abnormality No significant change since last tracing Confirmed by Deno Etienne [id number]) on 08/15/2020 9:21:35 AM   Radiology DG Chest Port 1 View  Result Date: 08/15/2020 CLINICAL DATA:  Shortness of breath EXAM: PORTABLE CHEST 1 VIEW COMPARISON:  July 10, 2020 FINDINGS:  There is slight interstitial thickening in the bases. There is an area of ill-defined opacity in the right upper lobe. The lungs elsewhere are clear. There is mild cardiomegaly with pulmonary vascularity normal. No adenopathy. No bone lesions. Surgical clips are noted in the left  cervical-thoracic region, likely due to hemithyroidectomy. IMPRESSION: 1. Ill-defined opacity right upper lobe concerning for early developing pneumonia right upper lobe. Bibasilar interstitial thickening is stable and may reflect a degree of underlying chronic bronchitis. There is cardiomegaly. Pulmonary vascularity is normal. No adenopathy. Electronically Signed   By: Lowella Grip III M.D.   On: 08/15/2020 10:48    Procedures Procedures (including critical care time)  Medications Ordered in ED Medications  alum & mag hydroxide-simeth (MAALOX/MYLANTA) 200-200-20 MG/5ML suspension 30 mL (has no administration in time range)  hydrALAZINE (APRESOLINE) tablet 100 mg (has no administration in time range)  NIFEdipine (PROCARDIA XL/NIFEDICAL XL) 24 hr tablet 60 mg (60 mg Oral Given 08/15/20 1245)  labetalol (NORMODYNE) tablet 200 mg (has no administration in time range)  sodium chloride flush (NS) 0.9 % injection 3 mL (has no administration in time range)  acetaminophen (TYLENOL) tablet 650 mg (has no administration in time range)    Or  acetaminophen (TYLENOL) suppository 650 mg (has no administration in time range)  albuterol (PROVENTIL) (2.5 MG/3ML) 0.083% nebulizer solution 2.5 mg (has no administration in time range)  dextrose 5 %-0.45 % sodium chloride infusion ( Intravenous New Bag/Given 08/15/20 1053)  hydrALAZINE (APRESOLINE) injection 10 mg (has no administration in time range)  potassium chloride SA (KLOR-CON) CR tablet 40 mEq (40 mEq Oral Given 08/15/20 1244)  dextrose 50 % solution 50 mL (50 mLs Intravenous Given 08/15/20 1053)    ED Course  I have reviewed the triage vital signs and the nursing notes.  Pertinent labs & imaging results that were available during my care of the patient were reviewed by me and considered in my medical decision making (see chart for details).    MDM Rules/Calculators/A&P                          69 yo M with a chief complaints of bright red blood  per rectum and a syncopal event.  Had a bowel movement that was bloody this morning.  Had to go back to the bathroom to urinate and then woke up with EMS around him.  Blood in the bowl per EMS.  Hemoglobin is about a gram and a half down from last check about a month ago.  Discussed with medicine for admission.  Message sent to [facility].   The patients results and plan were reviewed and discussed.   Any x-rays performed were independently reviewed by myself.   Differential diagnosis were considered with the presenting HPI.  Medications  alum & mag hydroxide-simeth (MAALOX/MYLANTA) 200-200-20 MG/5ML suspension 30 mL (has no administration in time range)  hydrALAZINE (APRESOLINE) tablet 100 mg (has no administration in time range)  NIFEdipine (PROCARDIA XL/NIFEDICAL XL) 24 hr tablet 60 mg (60 mg Oral Given 08/15/20 1245)  labetalol (NORMODYNE) tablet 200 mg (has no administration in time range)  sodium chloride flush (NS) 0.9 % injection 3 mL (has no administration in time range)  acetaminophen (TYLENOL) tablet 650 mg (has no administration in time range)    Or  acetaminophen (TYLENOL) suppository 650 mg (has no administration in time range)  albuterol (PROVENTIL) (2.5 MG/3ML) 0.083% nebulizer solution 2.5 mg (has no administration in time range)  dextrose 5 %-0.45 % sodium chloride infusion ( Intravenous New Bag/Given 08/15/20 1053)  hydrALAZINE (APRESOLINE) injection 10 mg (has no administration in time range)  potassium chloride SA (KLOR-CON) CR tablet 40 mEq (40 mEq Oral Given 08/15/20 1244)  dextrose 50 % solution 50 mL (50 mLs Intravenous Given 08/15/20 1053)    Vitals:   08/15/20 0912 08/15/20 1000 08/15/20 1010 08/15/20 1245  BP:   (!) 194/70 (!) 175/63  Pulse:  65 63 60  Resp:   15 17  Temp:      TempSrc:      SpO2:  98% 98% 97%  Weight: 136.1 kg     Height: 5\' 9"  (1.753 m)       Final diagnoses:  BRBPR (bright red blood per rectum)        Final Clinical Impression(s) /  ED Diagnoses Final diagnoses:  BRBPR (bright red blood per rectum)    Rx / DC Orders ED Discharge Orders    None       Deno Etienne, DO 08/15/20 1249

## 2020-08-15 NOTE — ED Notes (Signed)
GI at bedside

## 2020-08-16 ENCOUNTER — Telehealth: Payer: Self-pay

## 2020-08-16 ENCOUNTER — Other Ambulatory Visit: Payer: Self-pay

## 2020-08-16 ENCOUNTER — Ambulatory Visit: Payer: Self-pay | Admitting: Cardiology

## 2020-08-16 DIAGNOSIS — D649 Anemia, unspecified: Secondary | ICD-10-CM

## 2020-08-16 LAB — CBC
HCT: 33.7 % — ABNORMAL LOW (ref 39.0–52.0)
Hemoglobin: 10.7 g/dL — ABNORMAL LOW (ref 13.0–17.0)
MCH: 29.2 pg (ref 26.0–34.0)
MCHC: 31.8 g/dL (ref 30.0–36.0)
MCV: 92.1 fL (ref 80.0–100.0)
Platelets: 215 10*3/uL (ref 150–400)
RBC: 3.66 MIL/uL — ABNORMAL LOW (ref 4.22–5.81)
RDW: 14.9 % (ref 11.5–15.5)
WBC: 10.1 10*3/uL (ref 4.0–10.5)
nRBC: 0 % (ref 0.0–0.2)

## 2020-08-16 LAB — BASIC METABOLIC PANEL
Anion gap: 13 (ref 5–15)
BUN: 37 mg/dL — ABNORMAL HIGH (ref 8–23)
CO2: 23 mmol/L (ref 22–32)
Calcium: 8.7 mg/dL — ABNORMAL LOW (ref 8.9–10.3)
Chloride: 100 mmol/L (ref 98–111)
Creatinine, Ser: 3.31 mg/dL — ABNORMAL HIGH (ref 0.61–1.24)
GFR calc Af Amer: 21 mL/min — ABNORMAL LOW (ref >60)
GFR calc non Af Amer: 18 mL/min — ABNORMAL LOW (ref >60)
Glucose, Bld: 406 mg/dL — ABNORMAL HIGH (ref 70–99)
Potassium: 4.1 mmol/L (ref 3.5–5.1)
Sodium: 136 mmol/L (ref 135–145)

## 2020-08-16 LAB — GLUCOSE, CAPILLARY
Glucose-Capillary: 183 mg/dL — ABNORMAL HIGH (ref 70–99)
Glucose-Capillary: 231 mg/dL — ABNORMAL HIGH (ref 70–99)
Glucose-Capillary: 262 mg/dL — ABNORMAL HIGH (ref 70–99)
Glucose-Capillary: 331 mg/dL — ABNORMAL HIGH (ref 70–99)
Glucose-Capillary: 344 mg/dL — ABNORMAL HIGH (ref 70–99)
Glucose-Capillary: 373 mg/dL — ABNORMAL HIGH (ref 70–99)

## 2020-08-16 LAB — PROCALCITONIN: Procalcitonin: <0.1 ng/mL

## 2020-08-16 MED ORDER — INSULIN ASPART 100 UNIT/ML ~~LOC~~ SOLN
0.0000 [IU] | SUBCUTANEOUS | Status: DC
  Administered 2020-08-16: 9 [IU] via SUBCUTANEOUS
  Administered 2020-08-16 (×2): 7 [IU] via SUBCUTANEOUS

## 2020-08-16 MED ORDER — ALBUTEROL SULFATE (2.5 MG/3ML) 0.083% IN NEBU
2.5000 mg | INHALATION_SOLUTION | Freq: Three times a day (TID) | RESPIRATORY_TRACT | Status: DC
  Administered 2020-08-16: 2.5 mg via RESPIRATORY_TRACT
  Filled 2020-08-16 (×2): qty 3

## 2020-08-16 MED ORDER — INSULIN ASPART 100 UNIT/ML ~~LOC~~ SOLN
0.0000 [IU] | Freq: Three times a day (TID) | SUBCUTANEOUS | Status: DC
  Administered 2020-08-16: 2 [IU] via SUBCUTANEOUS
  Administered 2020-08-17: 1 [IU] via SUBCUTANEOUS
  Administered 2020-08-17: 2 [IU] via SUBCUTANEOUS

## 2020-08-16 MED ORDER — PREDNISONE 20 MG PO TABS
20.0000 mg | ORAL_TABLET | Freq: Every day | ORAL | Status: DC
  Administered 2020-08-17: 20 mg via ORAL
  Filled 2020-08-16: qty 1

## 2020-08-16 MED ORDER — INFLUENZA VAC A&B SA ADJ QUAD 0.5 ML IM PRSY
0.5000 mL | PREFILLED_SYRINGE | INTRAMUSCULAR | Status: AC
  Administered 2020-08-17: 0.5 mL via INTRAMUSCULAR
  Filled 2020-08-16: qty 0.5

## 2020-08-16 MED ORDER — DOXYCYCLINE HYCLATE 100 MG PO TABS
100.0000 mg | ORAL_TABLET | Freq: Two times a day (BID) | ORAL | Status: DC
  Administered 2020-08-16: 100 mg via ORAL
  Filled 2020-08-16: qty 1

## 2020-08-16 MED ORDER — INSULIN ASPART 100 UNIT/ML ~~LOC~~ SOLN
3.0000 [IU] | Freq: Three times a day (TID) | SUBCUTANEOUS | Status: DC
  Administered 2020-08-16 – 2020-08-17 (×3): 3 [IU] via SUBCUTANEOUS

## 2020-08-16 NOTE — Progress Notes (Signed)
Patient has home unit CPAP at beside. Patient is able to place on himself on and off when he is ready.

## 2020-08-16 NOTE — Progress Notes (Addendum)
Inpatient Diabetes Program Recommendations  AACE/ADA: New Consensus Statement on Inpatient Glycemic Control (2015)  Target Ranges:  Prepandial:   less than 140 mg/dL      Peak postprandial:   less than 180 mg/dL (1-2 hours)      Critically ill patients:  140 - 180 mg/dL   Results for JACEN, CARLINI (MRN [id number]) as of 08/16/2020 09:42  Ref. Range 08/15/2020 09:00 08/15/2020 10:43 08/15/2020 11:34 08/15/2020 16:19 08/15/2020 21:18  Glucose-Capillary Latest Ref Range: 70 - 99 mg/dL 74 57 (L) 134 (H) 153 (H)  125 mg Solumedrol given at 5:38pm 174 (H)   Results for ZAMIER, EGGEBRECHT (MRN [id number]) as of 08/16/2020 09:42  Ref. Range 08/16/2020 00:35 08/16/2020 05:34 08/16/2020 07:56  Glucose-Capillary Latest Ref Range: 70 - 99 mg/dL 231 (H) 331 (H)  7 units NOVOLOG _0 :10am 373 (H)  9 units NOVOLOG _1 :44am   Results for REBECCA, MOTTA (MRN [id number]) as of 08/16/2020 09:42  Ref. Range 07/11/2020 14:38  Hemoglobin A1C Latest Ref Range: 4.8 - 5.6 % 6.6 (H)    Admit with: AMS/ Hypoglycemia/ CAP   History: DM, CKD  Home DM Meds: Amaryl 4 mg Daily       Victoza 0.6 mg QOD  Current Orders: Novolog Sensitive Correction Scale/ SSI (0-9 units) Q4 hours    MD- Suspect severe Hyperglycemia this AM may be caused by the Solumedrol pt received last PM  Now getting Prednisone 40 mg Daily  Note patient with Severe Hypoglycemia on admission (per EMS, pt's CBG was 43 en route to hospital)  Given pt's Creatinine is elevated, may need to Stop the Amaryl for home.  Victoza has lower risk for Hypoglycemia so it may be OK to continue the home Victoza at time of d/c.  Would have pt follow up with his Endocrinologist Dr. Jacelyn Pi with [facility] after d/c for further diabetes management.    Addendum 12pm--Met w/ pt at bedside to review events of the last day.  Pt told me he has been checking his CBGs QAM for the last few weeks as directed by his PCP (PCP is Dr. Rickey Barbara and  ENDO is Dr. Chalmers Cater).  CBGs in the AM have been in the 80s per pt report.  Pt then told me he has had CBGs in the 40s the last 2 mornings prior to admission.  Pt told me his PCP stopped his Metformin about 1 month ago and he was also told to reduce his Amaryl to 1 tablet per day (I am assuming from 4 mg down to 2 mg).  Only takes his Victoza every other day b/c of cost issues.     We discussed how his elevated Creatinine may be contributing to his issues with Hypoglycemia.  Reviewed signs and symptoms of HYPO and how to treat at home.  Strongly encouraged pt to check his CBG and his BP at home when he has any strange symptoms at home (pt on multiple BP meds as well).  Discussed with pt that given his kidney damage and admission with HYPO, the discharging MD may decide to put a hold on his Amaryl and have him follow up with his PCP and ENDO.  Pt stated understanding.  Pt appreciative of visit and stated he had no further questions at this time.  Referred pt to the American Association of Diabetes website for further diabetes info for future reference as well.   --Will follow patient during hospitalization--  Wyn Quaker RN,  MSN, CDE Diabetes Coordinator Inpatient Glycemic Control Team Team Pager: [phone] (8a-5p)

## 2020-08-16 NOTE — Progress Notes (Addendum)
Progress Note    [name].  KDX:[id number] DOB: 10-24-1951  DOA: 08/15/2020 PCP: [name], DO    Brief Narrative:    Medical records reviewed and are as summarized below:  [name]. is an 69 y.o. male with medical history significant of hypertension, hyperlipidemia, hypothyroidism, diabetes mellitus type 2, pituitary macroadenoma, rheumatoid arthritis presented after being found acutely altered this morning.  His wife noted that he went to use the restroom and she found him 2 hours later still sitting on the commode.  He was not responding like normal and appeared to be in a daze.  He never fell off the commode or lost consciousness to her knowledge.  Patient notes that he had recently been told to cut down to juice wound Amaryl pill daily as there have been times in which he would be out in the yard and forget to eat and his sugars would drop.  Over the last couple weeks patient noted he had been having worsening of his lower extremity swelling and difficulty breathing intermittently.   Assessment/Plan:   Principal Problem:   Type 2 diabetes mellitus with hypoglycemia without coma (HCC) Active Problems:   Hypertensive urgency   OSA on CPAP   Stage 4 chronic kidney disease (HCC)   GI bleed   Acute on chronic diastolic CHF (congestive heart failure) (HCC)   Acute metabolic encephalopathy   Hypokalemia   Tobacco abuse   Bronchitis due to tobacco use   Hypoglycemia   Prolonged QT interval   Acute metabolic encephalopathy secondary to diabetes mellitus type 2 with hypoglycemia:  -Wife found the patient on the commode after being there for 2 hours dazing off and unresponsive.   -Initial CBG was 43 which he was given D5W with temporary improvement in blood sugars.:  The hospital repeat blood sugars dropped as low as 57.  -Hypoglycemic protocols -Hold Amaryl, but would recommend discontinuing upon d/c -Held Victoza but can likely continue at home -needs  dietary education-- have consulted nutrition -SSI with meal coverage  Community-acquired pneumonia: Acute.  Patient noted to have cough.  Found to have a ill-defined opacity of the right upper lung concerning for developing pneumonia.  However, given smoking history on the patient's differential should include possibility of malignancy. -initial procalcitonin negative -Check sputum cultures and studies -d/c rocephin but will continue doxycycline for now-- repeat pro calcitonin in AM- consider stopping if negative -CT without contrast as an outpatient  Bronchitis: Acute on chronic.Marland Kitchen  Patient noted to have wheezing on physical exam.  Chest x-ray also noted interstitial thickening concerning for chronic bronchitis. -Solu-Medrol 125 mg mg IV x1 dose, then prednisone taper as his blood sugars are rising -DuoNebs 4 times daily -Mucinex  Hypertensive urgency: Acute.  On admission patient blood pressure elevated up to 194/70.  Patient is on several different medications for blood pressure control at home. -Continue home blood pressure medication regimen  Diastolic heart failure exacerbation: Acute on chronic.  Patient reports worsening lower extremity swelling.  Noted to have 2+ pitting edema on bilateral lower extremities on physical exam.  Last EF was noted to be 55 to 60% with grade 2 diastolic dysfunction.  BNP on 8/30 was 1100. -BNP less than prior -no further lasix in hospital -dietary non-compliance  Hypokalemia: Acute.  Potassium 3.3 on admission.  Patient had been given 40 mEq of potassium chloride p.o. in the emergency department -Continue to monitor and replace as needed  Prolonged QT interval: QTc 511  on admission. -Correct electrolyte abnormality -Hold QT prolonging medications like Lexapro and Zofran -Repeat EKG pending  Normocytic anemia Hematuria: Acute.  Hemoglobin 11.6->11.3 g/dL on admission.  GI had been initially consulted with reports of possible GI bleed.   However, stool guaiacs were noted to be negative, patient denies any blood in stools, and therefore ruled out.   -u/a not showing blood -patient thinks the blood was from a skin tag that was irritated  GI bleed:  -outpatient colonoscopy as he declined in hospital (spoke with Dr. Tarri Glenn)  Chronic kidney disease stage IV: Creatinine 2.79 which appears near patient's baseline. -hold further diuresis due to increasing kidney function -needs low salt diet compliance at home  Hypothyroidism: Last TSH was noted to be 0.616 on 07/11/2020.  Patient on levothyroxine 200 mcg with breakfast. -Continue levothyroxine  Rheumatoid arthritis: Patient had been on methotrexate previously, but this was discontinued about 6 or more months ago.  Currently on leflunomide 10 mg daily and  tofacitinib 5 mg bid. -Continue current home regimen  Morbid obesity:  Estimated body mass index is 44.3 kg/m as calculated from the following:   Height as of this encounter: 5\' 9"  (1.753 m).   Weight as of this encounter: 136.1 kg.  OSA on CPAP -Continue CPAP at night  Tobacco abuse: Patient reports smoking a pack of cigarettes per day on average. -Nicotine patch -Continue counseling on the need of cessation of tobacco use   Per patient he has a cheat day from his diet every Friday. Also not sure how much he understands about his medications: he thought Dr. Terri Skains d/c'd his cardura but I reached out and he did not.  ? If he confused BPH meds with erectile dysfunction meds that Dr. Terri Skains said to avoid due to nitrates   Family Communication/Anticipated D/C date and plan/Code Status   DVT prophylaxis: scd Code Status: Full Code.  Disposition Plan: Status is: Inpatient  Remains inpatient appropriate because:Inpatient level of care appropriate due to severity of illness   Dispo: The patient is from: Home              Anticipated d/c is to: Home              Anticipated d/c date is: 1 day              Patient  currently is not medically stable to d/c. If Cr stable in the AM can d/c with close outpatient follow up with PCP and his endocrinologist     Medical Consultants:    GI     Subjective:   No further wheezing Says every Friday is his "cheat day " on his diet  Objective:    Vitals:   08/16/20 0034 08/16/20 0531 08/16/20 0805 08/16/20 1225  BP: (!) 150/61 (!) 146/52  (!) 154/53  Pulse: 66 74  70  Resp: 16 16  17   Temp: 98.1 F (36.7 C) 97.8 F (36.6 C)  98.2 F (36.8 C)  TempSrc: Oral Oral  Oral  SpO2: 97% 96% 95% 96%  Weight:      Height:        Intake/Output Summary (Last 24 hours) at 08/16/2020 1352 Last data filed at 08/16/2020 1215 Gross per 24 hour  Intake 1345.75 ml  Output 600 ml  Net 745.75 ml   Filed Weights   08/15/20 0912  Weight: 136.1 kg    Exam:  General: Appearance:    Severely obese male in no acute distress  Obese abdomen  Lungs:     Clear to auscultation bilaterally, respirations unlabored  Heart:    Normal heart rate. Normal rhythm. No murmurs, rubs, or gallops.   MS:   All extremities are intact.  Mild LE edema  Neurologic:   Awake, alert, oriented x 3. No apparent focal neurological           defect.     Data Reviewed:   I have personally reviewed following labs and imaging studies:  Labs: Labs show the following:   Basic Metabolic Panel: Recent Labs  Lab 08/15/20 0913 08/16/20 0823  NA 143 136  K 3.3* 4.1  CL 108 100  CO2 26 23  GLUCOSE 78 406*  BUN 29* 37*  CREATININE 2.79* 3.31*  CALCIUM 8.9 8.7*   GFR Estimated Creatinine Clearance: 29.7 mL/min (A) (by C-G formula based on SCr of 3.31 mg/dL (H)). Liver Function Tests: Recent Labs  Lab 08/15/20 0913  AST 24  ALT 16  ALKPHOS 55  BILITOT 0.4  PROT 6.1*  ALBUMIN 3.0*   No results for input(s): LIPASE, AMYLASE in the last 168 hours. No results for input(s): AMMONIA in the last 168 hours. Coagulation profile Recent Labs  Lab 08/15/20 1651  INR 1.2     CBC: Recent Labs  Lab 08/15/20 0913 08/15/20 1358 08/16/20 0823  WBC 6.8  --  10.1  HGB 11.6* 11.3* 10.7*  HCT 37.3* 35.3* 33.7*  MCV 94.7  --  92.1  PLT 234  --  215   Cardiac Enzymes: No results for input(s): CKTOTAL, CKMB, CKMBINDEX, TROPONINI in the last 168 hours. BNP (last 3 results) Recent Labs    08/01/20 1039  PROBNP 1,100*   CBG: Recent Labs  Lab 08/15/20 2118 08/16/20 0035 08/16/20 0534 08/16/20 0756 08/16/20 1105  GLUCAP 174* 231* 331* 373* 344*   D-Dimer: No results for input(s): DDIMER in the last 72 hours. Hgb A1c: No results for input(s): HGBA1C in the last 72 hours. Lipid Profile: No results for input(s): CHOL, HDL, LDLCALC, TRIG, CHOLHDL, LDLDIRECT in the last 72 hours. Thyroid function studies: No results for input(s): TSH, T4TOTAL, T3FREE, THYROIDAB in the last 72 hours.  Invalid input(s): FREET3 Anemia work up: No results for input(s): VITAMINB12, FOLATE, FERRITIN, TIBC, IRON, RETICCTPCT in the last 72 hours. Sepsis Labs: Recent Labs  Lab 08/15/20 0913 08/15/20 0928 08/15/20 1651 08/16/20 0823  PROCALCITON  --   --  <0.10  --   WBC 6.8  --   --  10.1  LATICACIDVEN  --  1.0  --   --     Microbiology Recent Results (from the past 240 hour(s))  SARS Coronavirus 2 by RT PCR (hospital order, performed in [facility] hospital lab) Nasopharyngeal Nasopharyngeal Swab     Status: None   Collection Time: 08/15/20  9:41 AM   Specimen: Nasopharyngeal Swab  Result Value Ref Range Status   SARS Coronavirus 2 NEGATIVE NEGATIVE Final    Comment: (NOTE) SARS-CoV-2 target nucleic acids are NOT DETECTED.  The SARS-CoV-2 RNA is generally detectable in upper and lower respiratory specimens during the acute phase of infection. The lowest concentration of SARS-CoV-2 viral copies this assay can detect is 250 copies / mL. A negative result does not preclude SARS-CoV-2 infection and should not be used as the sole basis for treatment or  other patient management decisions.  A negative result may occur with improper specimen collection / handling, submission of specimen other than nasopharyngeal swab, presence of viral mutation(s)  within the areas targeted by this assay, and inadequate number of viral copies (<250 copies / mL). A negative result must be combined with clinical observations, patient history, and epidemiological information.  Fact Sheet for Patients:   StrictlyIdeas.no  Fact Sheet for Healthcare Providers: BankingDealers.co.za  This test is not yet approved or  cleared by the [place] FDA and has been authorized for detection and/or diagnosis of SARS-CoV-2 by FDA under an Emergency Use Authorization (EUA).  This EUA will remain in effect (meaning this test can be used) for the duration of the COVID-19 declaration under Section 564(b)(1) of the Act, 21 U.S.C. section 360bbb-3(b)(1), unless the authorization is terminated or revoked sooner.  Performed at [facility], [place] [address], [place], [place] [address]     Procedures and diagnostic studies:  DG Chest Port 1 View  Result Date: 08/15/2020 CLINICAL DATA:  Shortness of breath EXAM: PORTABLE CHEST 1 VIEW COMPARISON:  July 10, 2020 FINDINGS: There is slight interstitial thickening in the bases. There is an area of ill-defined opacity in the right upper lobe. The lungs elsewhere are clear. There is mild cardiomegaly with pulmonary vascularity normal. No adenopathy. No bone lesions. Surgical clips are noted in the left cervical-thoracic region, likely due to hemithyroidectomy. IMPRESSION: 1. Ill-defined opacity right upper lobe concerning for early developing pneumonia right upper lobe. Bibasilar interstitial thickening is stable and may reflect a degree of underlying chronic bronchitis. There is cardiomegaly. Pulmonary vascularity is normal. No adenopathy. Electronically Signed   By: Lowella Grip III M.D.   On: 08/15/2020 10:48    Medications:   . albuterol  2.5 mg Nebulization QID  . aspirin EC  81 mg Oral QPM  . doxazosin  2 mg Oral BID  . doxycycline  100 mg Oral Q12H  . guaiFENesin  600 mg Oral BID  . hydrALAZINE  100 mg Oral TID  . insulin aspart  0-9 Units Subcutaneous TID WC  . insulin aspart  3 Units Subcutaneous TID WC  . isosorbide dinitrate  60 mg Oral TID  . labetalol  200 mg Oral BID  . leflunomide  10 mg Oral Daily  . levothyroxine  200 mcg Oral QAC breakfast  . nicotine  21 mg Transdermal Daily  . NIFEdipine  60 mg Oral Daily  . pravastatin  40 mg Oral QPM  . [START ON 08/17/2020] predniSONE  20 mg Oral Q breakfast  . sodium chloride flush  3 mL Intravenous Q12H  . Tofacitinib Citrate  5 mg Oral BID   Continuous Infusions: . cefTRIAXone (ROCEPHIN)  IV 2 g (08/15/20 1932)     LOS: 1 day   Geradine Girt  [facility]   How to contact the [facility] Attending or Consulting provider [place] or covering provider during after hours [place], for this patient?  1. Check the care team in [facility] and look for a) attending/consulting [facility] provider listed and b) the [facility] team listed 2. Log into [email or web address] and use [place]'s universal password to access. If you do not have the password, please contact the hospital operator. 3. Locate the [facility] provider you are looking for under [facility] and page to a number that you can be directly reached. 4. If you still have difficulty reaching the provider, please page the [facility] (Director on Call) for the Hospitalists listed on amion for assistance.  08/16/2020, 1:52 PM

## 2020-08-16 NOTE — Discharge Instructions (Signed)
Fingerstick glucose (sugar) goals for home: Before meals: 80-130 mg/dl 2-Hours after meals: less than 180 mg/dl Hemoglobin A1c goal: 7% or less   Website: American Diabetes Association Www.diabetes.org   Symptoms of Hypoglycemia: Silly, Sweaty, Shaky Check sugar if you have your meter.  If near or less than 70 mg/dl, treat with 1/2 cup juice or soda or take glucose tablets Check sugar 15 minutes after treatment.  If sugar still near or less than 70 mg/al and symptomatic, treat again and may need a snack with some protein (peanut butter with crackers, etc)     Hypoglycemia Hypoglycemia occurs when the level of sugar (glucose) in the blood is too low. Hypoglycemia can happen in people who do or do not have diabetes. It can develop quickly, and it can be a medical emergency. For most people with diabetes, a blood glucose level below 70 mg/dL (3.9 mmol/L) is considered hypoglycemia. Glucose is a type of sugar that provides the body's main source of energy. Certain hormones (insulin and glucagon) control the level of glucose in the blood. Insulin lowers blood glucose, and glucagon raises blood glucose. Hypoglycemia can result from having too much insulin in the bloodstream, or from not eating enough food that contains glucose. You may also have reactive hypoglycemia, which happens within 4 hours after eating a meal. What are the causes? Hypoglycemia occurs most often in people who have diabetes and may be caused by:  Diabetes medicine.  Not eating enough, or not eating often enough.  Increased physical activity.  Drinking alcohol on an empty stomach. If you do not have diabetes, hypoglycemia may be caused by:  A tumor in the pancreas.  Not eating enough, or not eating for long periods at a time (fasting).  A severe infection or illness.  Certain medicines. What increases the risk? Hypoglycemia is more likely to develop in:  People who have diabetes and take medicines to lower  blood glucose.  People who abuse alcohol.  People who have a severe illness. What are the signs or symptoms? Mild symptoms Mild hypoglycemia may not cause any symptoms. If you do have symptoms, they may include:  Hunger.  Anxiety.  Sweating and feeling clammy.  Dizziness or feeling light-headed.  Sleepiness.  Nausea.  Increased heart rate.  Headache.  Blurry vision.  Irritability.  Tingling or numbness around the mouth, lips, or tongue.  A change in coordination.  Restless sleep. Moderate symptoms Moderate hypoglycemia can cause:  Mental confusion and poor judgment.  Behavior changes.  Weakness.  Irregular heartbeat. Severe symptoms Severe hypoglycemia is a medical emergency. It can cause:  Fainting.  Seizures.  Loss of consciousness (coma).  Death. How is this diagnosed? Hypoglycemia is diagnosed with a blood test to measure your blood glucose level. This blood test is done while you are having symptoms. Your health care provider may also do a physical exam and review your medical history. How is this treated? This condition can often be treated by immediately eating or drinking something that contains sugar, such as:  Fruit juice, 4-6 oz (120-150 mL).  Regular soda (not diet soda), 4-6 oz (120-150 mL).  Low-fat milk, 4 oz (120 mL).  Several pieces of hard candy.  Sugar or honey, 1 Tbsp (15 mL). Treating hypoglycemia if you have diabetes  If you are alert and able to swallow safely, follow the 15:15 rule:  Take 15 grams of a rapid-acting carbohydrate. Talk with your health care provider about how much you should take.  Rapid-acting options  include: ? Glucose pills (take 15 grams). ? 6-8 pieces of hard candy. ? 4-6 oz (120-150 mL) of fruit juice. ? 4-6 oz (120-150 mL) of regular (not diet) soda. ? 1 Tbsp (15 mL) honey or sugar.  Check your blood glucose 15 minutes after you take the carbohydrate.  If the repeat blood glucose level is  still at or below 70 mg/dL (3.9 mmol/L), take 15 grams of a carbohydrate again.  If your blood glucose level does not increase above 70 mg/dL (3.9 mmol/L) after 3 tries, seek emergency medical care.  After your blood glucose level returns to normal, eat a meal or a snack within 1 hour.  Treating severe hypoglycemia Severe hypoglycemia is when your blood glucose level is at or below 54 mg/dL (3 mmol/L). Severe hypoglycemia is a medical emergency. Get medical help right away. If you have severe hypoglycemia and you cannot eat or drink, you may need an injection of glucagon. A family member or close friend should learn how to check your blood glucose and how to give you a glucagon injection. Ask your health care provider if you need to have an emergency glucagon injection kit available. Severe hypoglycemia may need to be treated in a hospital. The treatment may include getting glucose through an IV. You may also need treatment for the cause of your hypoglycemia. Follow these instructions at home:   General instructions  Take over-the-counter and prescription medicines only as told by your health care provider.  Monitor your blood glucose as told by your health care provider.  Limit alcohol intake to no more than 1 drink a day for nonpregnant women and 2 drinks a day for men. One drink equals 12 oz of beer (355 mL), 5 oz of wine (148 mL), or 1 oz of hard liquor (44 mL).  Keep all follow-up visits as told by your health care provider. This is important. If you have diabetes:  Always have a rapid-acting carbohydrate snack with you to treat low blood glucose.  Follow your diabetes management plan as directed. Make sure you: ? Know the symptoms of hypoglycemia. It is important to treat it right away to prevent it from becoming severe. ? Take your medicines as directed. ? Follow your exercise plan. ? Follow your meal plan. Eat on time, and do not skip meals. ? Check your blood glucose as  often as directed. Always check before and after exercise. ? Follow your sick day plan whenever you cannot eat or drink normally. Make this plan in advance with your health care provider.  Share your diabetes management plan with people in your workplace, school, and household.  Check your urine for ketones when you are ill and as told by your health care provider.  Carry a medical alert card or wear medical alert jewelry. Contact a health care provider if:  You have problems keeping your blood glucose in your target range.  You have frequent episodes of hypoglycemia. Get help right away if:  You continue to have hypoglycemia symptoms after eating or drinking something containing glucose.  Your blood glucose is at or below 54 mg/dL (3 mmol/L).  You have a seizure.  You faint. These symptoms may represent a serious problem that is an emergency. Do not wait to see if the symptoms will go away. Get medical help right away. Call your local emergency services (911 in the [place] Summary  Hypoglycemia occurs when the level of sugar (glucose) in the blood is too low.  Hypoglycemia can  happen in people who do or do not have diabetes. It can develop quickly, and it can be a medical emergency.  Make sure you know the symptoms of hypoglycemia and how to treat it.  Always have a rapid-acting carbohydrate snack with you to treat low blood sugar. This information is not intended to replace advice given to you by your health care provider. Make sure you discuss any questions you have with your health care provider. Document Revised: 05/12/2018 Document Reviewed: 12/23/2015 Elsevier Patient Education  2020 [employer].

## 2020-08-16 NOTE — Telephone Encounter (Signed)
Pt scheduled for previsit 09/27/20 at 4:30pm, covid screen scheduled for 10/14/20 at 9am, colon scheduled at [facility] 10/18/20@8 :30am. Appt letter mailed to pt.

## 2020-08-16 NOTE — Progress Notes (Signed)
PHARMACIST - PHYSICIAN COMMUNICATION DR:   Eliseo Squires CONCERNING: Antibiotic IV to Oral Route Change Policy  RECOMMENDATION: This patient is receiving doxycycline by the intravenous route.  Based on criteria approved by the Pharmacy and Therapeutics Committee, the antibiotic(s) is/are being converted to the equivalent oral dose form(s).   DESCRIPTION: These criteria include:  Patient being treated for a respiratory tract infection, urinary tract infection, cellulitis or clostridium difficile associated diarrhea if on metronidazole  The patient is not neutropenic and does not exhibit a GI malabsorption state  The patient is eating (either orally or via tube) and/or has been taking other orally administered medications for a least 24 hours  The patient is improving clinically and has a Tmax < 100.5  If you have questions about this conversion, please contact the Pharmacy Department  []   [phone] )  [facility] []   [phone] )  [facility] [x]   [phone] )  [facility] []   [phone] )  [facility] []   [phone] )  [facility]

## 2020-08-16 NOTE — Progress Notes (Signed)
     [facility] Progress Note  CC:  Bleeding  Assessment / Plan: ? Rectal bleeding or ? Hematuria     - not clear by history from patient    - no melena, hematochezia, or related GI symptoms    - no source or blood identified on rectal exam Normocytic anemia    - slight decline in hemoglobin from 13 last month, to 11.3 today    - likely multifactorial including chronic disease, chronic kidney disease +/- hematuria Chronic kidney disease - stage 4 with creatinine of 2.7 Prior colonoscopy for rectal bleeding in 2005 revealed internal hemorrhoids Cologuard negative 11/2019 BMI 44.3  Given the unusual nature of his symptoms, I recommend a colonoscopy to exclude any GI tract bleeding. He declines inpatient evaluation and would prefer to return to GI as an outpatient. I will arrange for outpatient follow-up at [facility].   Discussed with Dr. Eliseo Squires. The inpatient [facility] team will move to stand-by. Please call the on-call gastroenterologist with any additional questions or concerns during this hospitalization.   Subjective: No additional overt bleeding. Now wonders if there may have been some blood on the side of his underwear. GI ROS is completely negative.   Objective:  Vital signs in last 24 hours: Temp:  [97.3 F (36.3 C)-98.1 F (36.7 C)] 97.8 F (36.6 C) (09/14 0531) Pulse Rate:  [55-74] 74 (09/14 0531) Resp:  [15-18] 16 (09/14 0531) BP: (146-194)/(51-70) 146/52 (09/14 0531) SpO2:  [95 %-99 %] 95 % (09/14 0805) Weight:  [136.1 kg] 136.1 kg (09/13 0912) Last BM Date: 08/15/20 General:   Alert, in NAD Heart:  Regular rate and rhythm; no murmurs Pulm: Clear anteriorly; no wheezing Abdomen:  Soft. Nontender. Nondistended. Central obesity.  Normal bowel sounds. No rebound or guarding. LAD: No inguinal or umbilical LAD Extremities:  Without edema. Neurologic:  Alert and  oriented x4;  grossly normal neurologically. Psych:  Alert and cooperative. Normal mood and  affect.  Lab Results: Recent Labs    08/15/20 0913 08/15/20 1358  WBC 6.8  --   HGB 11.6* 11.3*  HCT 37.3* 35.3*  PLT 234  --    BMET Recent Labs    08/15/20 0913  NA 143  K 3.3*  CL 108  CO2 26  GLUCOSE 78  BUN 29*  CREATININE 2.79*  CALCIUM 8.9   LFT Recent Labs    08/15/20 0913  PROT 6.1*  ALBUMIN 3.0*  AST 24  ALT 16  ALKPHOS 55  BILITOT 0.4   PT/INR Recent Labs    08/15/20 1651  LABPROT 14.2  INR 1.2     LOS: 1 day   Thornton Park  08/16/2020, 9:09 AM

## 2020-08-16 NOTE — Telephone Encounter (Signed)
-----   Message from Thornton Park, MD sent at 08/16/2020  9:20 AM EDT ----- Patient currently hospitalized but needs outpatient colonoscopy to evaluate anemia. BMI is 44.3. May be too close to 41 for [facility]? If so, please schedule at the hospital. Time frame is elective, not urgent.  Thank you.  KLB

## 2020-08-17 DIAGNOSIS — R9431 Abnormal electrocardiogram [ECG] [EKG]: Secondary | ICD-10-CM

## 2020-08-17 DIAGNOSIS — K922 Gastrointestinal hemorrhage, unspecified: Secondary | ICD-10-CM

## 2020-08-17 LAB — GLUCOSE, CAPILLARY
Glucose-Capillary: 198 mg/dL — ABNORMAL HIGH (ref 70–99)
Glucose-Capillary: 148 mg/dL — ABNORMAL HIGH (ref 70–99)

## 2020-08-17 LAB — CBC
HCT: 31 % — ABNORMAL LOW (ref 39.0–52.0)
Hemoglobin: 10 g/dL — ABNORMAL LOW (ref 13.0–17.0)
MCH: 29.6 pg (ref 26.0–34.0)
MCHC: 32.3 g/dL (ref 30.0–36.0)
MCV: 91.7 fL (ref 80.0–100.0)
Platelets: 200 10*3/uL (ref 150–400)
RBC: 3.38 MIL/uL — ABNORMAL LOW (ref 4.22–5.81)
RDW: 14.9 % (ref 11.5–15.5)
WBC: 11.9 10*3/uL — ABNORMAL HIGH (ref 4.0–10.5)
nRBC: 0 % (ref 0.0–0.2)

## 2020-08-17 LAB — BASIC METABOLIC PANEL
Anion gap: 11 (ref 5–15)
BUN: 57 mg/dL — ABNORMAL HIGH (ref 8–23)
CO2: 23 mmol/L (ref 22–32)
Calcium: 8.9 mg/dL (ref 8.9–10.3)
Chloride: 103 mmol/L (ref 98–111)
Creatinine, Ser: 3.29 mg/dL — ABNORMAL HIGH (ref 0.61–1.24)
GFR calc Af Amer: 21 mL/min — ABNORMAL LOW (ref >60)
GFR calc non Af Amer: 18 mL/min — ABNORMAL LOW (ref >60)
Glucose, Bld: 225 mg/dL — ABNORMAL HIGH (ref 70–99)
Potassium: 3.9 mmol/L (ref 3.5–5.1)
Sodium: 137 mmol/L (ref 135–145)

## 2020-08-17 LAB — PROCALCITONIN: Procalcitonin: <0.1 ng/mL

## 2020-08-17 LAB — LEGIONELLA PNEUMOPHILA SEROGP 1 UR AG: L. pneumophila Serogp 1 Ur Ag: NEGATIVE

## 2020-08-17 MED ORDER — PREDNISONE 20 MG PO TABS: 20.0000 mg | ORAL_TABLET | Freq: Every day | ORAL | 0 refills | Status: AC

## 2020-08-17 NOTE — Progress Notes (Signed)
[name] to be D/C'd Home per MD order.  Discussed with the patient and all questions fully answered.   VSS, Skin clean, dry and intact without evidence of skin break down, no evidence of skin tears noted. IV catheter discontinued intact. Site without signs and symptoms of complications. Dressing and pressure applied.   An After Visit Summary was printed and given to the patient.    D/C education completed with patient/family including follow up instructions, medication list, d/c activities limitations if indicated, with other d/c instructions as indicated by MD - patient able to verbalize understanding, all questions fully answered.    Patient instructed to return to ED, call 911, or call MD for any changes in condition.    Patient escorted via [place], and D/C home via car.

## 2020-08-17 NOTE — Discharge Summary (Addendum)
Physician Discharge Summary  Dwayne Mull Jr. CHY:[id number] DOB: 02/02/1951 DOA: 08/15/2020  PCP: [name], DO  Admit date: 08/15/2020 Discharge date: 08/17/2020  Admitted From: Home Disposition: Home  Recommendations for Outpatient Follow-up:  1. Follow up with PCP in 1-2 weeks 2. Follow-up with endocrinology, Dr. Soyla Murphy as scheduled on 08/19/2020 3. Discontinued home Amaryl secondary to recurrent hypoglycemic episodes, may need to consider initiation of insulin therapy given his renal dysfunction 4. Consider outpatient referral to nephrology for surveillance of renal dysfunction 5. Ill-defined opacity right upper lobe, noninfectious.  Recommend interval follow-up with consideration of CT chest for further evaluation versus pulmonology referral given his history of tobacco use disorder with differential malignancy. 6. Please obtain BMP in one week at PCP/specialist visit  Home Health: No Equipment/Devices: None  Discharge Condition: Stable CODE STATUS: Full code Diet recommendation: Heart healthy/consistent carbohydrate diet  History of present illness:  [name]. is a 69 year old male with past medical history notable for essential hypertension, hyperlipidemia, hypothyroidism, type 2 diabetes mellitus, pituitary macroadenoma, rheumatoid arthritis who presented to the ED after being found acutely altered.  Spouse noted, patient went to use the restroom and found him 2 hours later sitting on the commode.  He appeared to be in a daze not responding appropriately.  Patient never fell or lost consciousness.  He has been recently told to reduce the dose of his Amaryl pill as there has been times recently that his blood sugars have been low.   Hospital course:  Acute metabolic encephalopathy secondary to recurrent hypoglycemic episodes Patient presenting to the ED following episode of confusion at home.  Patient without loss of consciousness but not responding appropriately  after going to the bathroom and sitting on the commode for roughly 2 hours.  Patient has had fluctuating blood sugars with several hypoglycemic events at home as of late.  Was instructed to decrease his Amaryl dose recently.  Upon presentation he was noted to have blood sugar of 43 in which he was given D5W with temporary improvement.  His Amaryl was discontinued.  He was seen by the diabetic educator.  Patient may resume his home Victoza but will discontinue Amaryl.  Patient has follow-up scheduled with his endocrinologist, Dr. Redmond Pulling on Friday, 08/19/2020.  Given his renal dysfunction, patient may benefit from insulin therapy moving forward if his blood sugars remain erratic on oral therapy.  Right upper lung opacity Chest x-ray admission shows ill-defined opacity right upper lobe concerning for possible developing pneumonia.  Patient was initially started on antibiotics.  Procalcitonin was within normal limits, unlikely infectious; and subsequently antibiotics were discontinued.  Patient has been afebrile without leukocytosis.  Given his history of tobacco use, concern for possible underlying malignancy.  Recommend interval follow-up following discharge with outpatient CT chest versus pulmonology referral.  Bronchitis, acute on chronic Patient was noted to have wheezing on physical exam on presentation.  Chest x-ray notable for interstitial thickening concerning for chronic bronchitis.  Patient was initially started on IV steroids then tapered down on prednisone.  Continue 3 additional days of prednisone following discharge.  Patient oxygenating well on room air at time of discharge.  Essential hypertension Continue home furosemide 40 mg p.o. twice daily, hydralazine 100 mg p.o. 3 times daily, isosorbide dinitrate 60 mg 3 times daily, labetalol 200 mg p.o. twice daily, nifedipine 60 mg p.o. daily.  Blood pressure 132/61 at time of discharge.  HLD: Continue pravastatin 40 mg p.o. daily  Hypothyroidism:  Continue levothyroxine 200 mcg p.o.  daily  Rheumatoid arthritis: Continue leflunomide 10 mg p.o. daily, Xeljanz 5 mg p.o. twice daily, prednisone 5 mg p.o. as needed  Hypokalemia: Resolved Repleted during hospitalization.  Potassium 3.9 at time of discharge.  Prolonged QT interval QTC 511 on admission.  Normocytic anemia Patient's hemoglobin 11.6 on admission.  Gastroenterology was initially consulted for concern of possible GI bleed.  However stool guaiacs were noted to be negative and patient denies any blood in the stools.  GI with no indications for inpatient colonoscopy/EGD and may follow-up outpatient.  Hemoglobin 10.0 at time of discharge.  CKD stage IV Creatinine 2.79 on admission, trended up to 3.37 during hospitalization.  Creatinine to 3.29 at time of discharge.  Etiology likely secondary to poorly controlled hypertension as well as underlying diabetes.  Would likely benefit from [facility] referral to nephrology for further surveillance.  Recommend repeat BMP in 1-2 weeks.  OSA on CPAP Continue nocturnal CPAP  Tobacco use disorder. Patient continues to smoke roughly 1 pack of cigarettes per day.  Counseled on need for complete cessation.  Outpatient follow-up with PCP.  Morbid obesity Body mass index is 44.3 kg/m.  Discussed with patient needs for aggressive lifestyle changes/weight loss as this complicates all facets of care.   Discharge Diagnoses:  Principal Problem:   Type 2 diabetes mellitus with hypoglycemia without coma (HCC) Active Problems:   OSA on CPAP   Stage 4 chronic kidney disease (HCC)   Tobacco abuse   Bronchitis due to tobacco use   Prolonged QT interval    Discharge Instructions  Discharge Instructions    Call MD for:  difficulty breathing, headache or visual disturbances   Complete by: As directed    Call MD for:  extreme fatigue   Complete by: As directed    Call MD for:  persistant dizziness or light-headedness   Complete by: As directed     Call MD for:  persistant nausea and vomiting   Complete by: As directed    Call MD for:  severe uncontrolled pain   Complete by: As directed    Call MD for:  temperature >100.4   Complete by: As directed    Diet - low sodium heart healthy   Complete by: As directed    Increase activity slowly   Complete by: As directed      Allergies as of 08/17/2020      Reactions   Atorvastatin Other (See Comments)   unknown   Farxiga [dapagliflozin] Other (See Comments)   Yeast infection    Folic Acid Hives   Humira [adalimumab] Other (See Comments)   Caused a stroke    Metformin And Related Other (See Comments)   Weak if takes 1000 mg   Plavix [clopidogrel Bisulfate] Hives   Simvastatin Other (See Comments)   Couldn't think straight       Medication List    STOP taking these medications   glimepiride 4 MG tablet Commonly known as: AMARYL     TAKE these medications   acetaminophen 500 MG tablet Commonly known as: TYLENOL Take 1,000 mg by mouth every 6 (six) hours as needed for mild pain.   albuterol 108 (90 Base) MCG/ACT inhaler Commonly known as: VENTOLIN HFA Inhale 1-2 puffs into the lungs every 6 (six) hours as needed for wheezing or shortness of breath.   aspirin EC 81 MG tablet Take 81 mg by mouth every evening.   cyclobenzaprine 10 MG tablet Commonly known as: FLEXERIL Take 10 mg by mouth at bedtime  as needed for muscle spasms.   doxazosin 2 MG tablet Commonly known as: CARDURA Take 2 mg by mouth 2 (two) times daily.   EPINEPHrine 0.3 mg/0.3 mL Soaj injection Commonly known as: EPI-PEN Inject 0.3 mLs (0.3 mg total) into the muscle as needed for up to 2 doses.   escitalopram 10 MG tablet Commonly known as: LEXAPRO Take 10 mg by mouth daily.   furosemide 80 MG tablet Commonly known as: LASIX Take 40 mg by mouth 2 (two) times daily.   hydrALAZINE 100 MG tablet Commonly known as: APRESOLINE Take 1 tablet (100 mg total) by mouth 3 (three) times daily.    isosorbide dinitrate 30 MG tablet Commonly known as: ISORDIL Take 2 tablets (60 mg total) by mouth 3 (three) times daily.   labetalol 200 MG tablet Commonly known as: NORMODYNE Take 1 tablet (200 mg total) by mouth 2 (two) times daily.   leflunomide 10 MG tablet Commonly known as: ARAVA Take 10 mg by mouth daily.   levothyroxine 200 MCG tablet Commonly known as: SYNTHROID Take 200 mcg by mouth daily before breakfast.   liraglutide 18 MG/3ML Sopn Commonly known as: VICTOZA Inject 0.6 mg into the skin every other day. At bedtime   NIFEdipine 60 MG 24 hr tablet Commonly known as: Procardia XL Take 1 tablet (60 mg total) by mouth daily.   OneTouch Verio test strip Generic drug: glucose blood 3 (three) times daily.   POTASSIUM CHLORIDE PO Take 10 mEq by mouth daily.   pravastatin 40 MG tablet Commonly known as: PRAVACHOL Take 40 mg by mouth every evening.   predniSONE 5 MG tablet Commonly known as: DELTASONE Take 5 mg by mouth as needed (arthritis). What changed: Another medication with the same name was changed. Make sure you understand how and when to take each.   predniSONE 20 MG tablet Commonly known as: DELTASONE Take 1 tablet (20 mg total) by mouth daily with breakfast for 3 days. Start taking on: August 18, 2020 What changed:   medication strength  how much to take  when to take this  reasons to take this   Voltaren 1 % Gel Generic drug: diclofenac Sodium Apply 2 g topically 4 (four) times daily as needed (pain).   Xeljanz 5 MG Tabs Generic drug: Tofacitinib Citrate Take 5 mg by mouth 2 (two) times daily.       Follow-up Information    [name], DO. Schedule an appointment as soon as possible for a visit in 1 week(s).   Specialty: Family Medicine Contact information: [address] [place] [address] [phone]        Jacelyn Pi, MD. Go on 08/19/2020.   Specialty: Endocrinology Contact information: [place] [address] [phone]              Allergies  Allergen Reactions   Atorvastatin Other (See Comments)    unknown   Farxiga [Dapagliflozin] Other (See Comments)    Yeast infection    Folic Acid Hives   Humira [Adalimumab] Other (See Comments)    Caused a stroke    Metformin And Related Other (See Comments)    Weak if takes 1000 mg   Plavix [Clopidogrel Bisulfate] Hives   Simvastatin Other (See Comments)    Couldn't think straight     Consultations:  [facility], Dr. Tarri Glenn   Procedures/Studies: DG Chest Port 1 View  Result Date: 08/15/2020 CLINICAL DATA:  Shortness of breath EXAM: PORTABLE CHEST 1  VIEW COMPARISON:  July 10, 2020 FINDINGS: There is slight interstitial thickening in the bases. There is an area of ill-defined opacity in the right upper lobe. The lungs elsewhere are clear. There is mild cardiomegaly with pulmonary vascularity normal. No adenopathy. No bone lesions. Surgical clips are noted in the left cervical-thoracic region, likely due to hemithyroidectomy. IMPRESSION: 1. Ill-defined opacity right upper lobe concerning for early developing pneumonia right upper lobe. Bibasilar interstitial thickening is stable and may reflect a degree of underlying chronic bronchitis. There is cardiomegaly. Pulmonary vascularity is normal. No adenopathy. Electronically Signed   By: Lowella Grip III M.D.   On: 08/15/2020 10:48   PCV ECHOCARDIOGRAM COMPLETE  Result Date: 08/02/2020 Echocardiogram 08/01/2020: Left ventricle cavity is normal in size. Mild concentric hypertrophy of the left ventricle. Normal global Eberle motion. Normal LV systolic function with visual EF 55-60%. Doppler evidence of grade II (pseudonormal) diastolic dysfunction, elevated LAP. Left atrial cavity is moderately dilated. Mild (Grade I) mitral regurgitation. Mild tricuspid regurgitation. Estimated pulmonary artery systolic pressure 34 mmHg.       Subjective: Patient seen and examined at bedside, resting comfortably.  No complaints this morning.  No further episodes of hypoglycemia during hospitalization following discontinuation of Amaryl.  Patient ready for discharge home.  Denies headache, no chest pain, palpitations, no shortness of breath, no abdominal pain.  No acute events overnight per nursing staff.  Discharge Exam: Vitals:   08/17/20 0059 08/17/20 0428  BP: (!) 144/56 132/61  Pulse: 62 62  Resp: 18 18  Temp: 97.6 F (36.4 C) 97.6 F (36.4 C)  SpO2: 95% 95%   Vitals:   08/16/20 1656 08/16/20 2030 08/17/20 0059 08/17/20 0428  BP: (!) 157/61  (!) 144/56 132/61  Pulse: 72  62 62  Resp: 17  18 18   Temp: 98.1 F (36.7 C)  97.6 F (36.4 C) 97.6 F (36.4 C)  TempSrc: Oral  Oral Oral  SpO2: 96% 95% 95% 95%  Weight:      Height:        General: Pt is alert, awake, not in acute distress, obese Cardiovascular: RRR, S1/S2 +, no rubs, no gallops Respiratory: CTA bilaterally, no wheezing, no rhonchi Abdominal: Soft, NT, ND, bowel sounds + Extremities: no edema, no cyanosis    The results of significant diagnostics from this hospitalization (including imaging, microbiology, ancillary and laboratory) are listed below for reference.     Microbiology: Recent Results (from the past 240 hour(s))  SARS Coronavirus 2 by RT PCR (hospital order, performed in [facility] hospital lab) Nasopharyngeal Nasopharyngeal Swab     Status: None   Collection Time: 08/15/20  9:41 AM   Specimen: Nasopharyngeal Swab  Result Value Ref Range Status   SARS Coronavirus 2 NEGATIVE NEGATIVE Final    Comment: (NOTE) SARS-CoV-2 target nucleic acids are NOT DETECTED.  The SARS-CoV-2 RNA is generally detectable in upper and lower respiratory specimens during the acute phase of infection. The lowest concentration of SARS-CoV-2 viral copies this assay can detect is 250 copies / mL. A negative result does not preclude SARS-CoV-2  infection and should not be used as the sole basis for treatment or other patient management decisions.  A negative result may occur with improper specimen collection / handling, submission of specimen other than nasopharyngeal swab, presence of viral mutation(s) within the areas targeted by this assay, and inadequate number of viral copies (<250 copies / mL). A negative result must be combined with clinical observations, patient history, and epidemiological information.  Fact Sheet for Patients:   StrictlyIdeas.no  Fact Sheet for Healthcare Providers: BankingDealers.co.za  This test is not yet approved or  cleared by the [place] FDA and has been authorized for detection and/or diagnosis of SARS-CoV-2 by FDA under an Emergency Use Authorization (EUA).  This EUA will remain in effect (meaning this test can be used) for the duration of the COVID-19 declaration under Section 564(b)(1) of the Act, 21 U.S.C. section 360bbb-3(b)(1), unless the authorization is terminated or revoked sooner.  Performed at [facility], [place] [address], [place], [place] [address]      Labs: BNP (last 3 results) Recent Labs    08/15/20 1358  BNP 540.0*   Basic Metabolic Panel: Recent Labs  Lab 08/15/20 0913 08/16/20 0823 08/17/20 0240  NA 143 136 137  K 3.3* 4.1 3.9  CL 108 100 103  CO2 26 23 23   GLUCOSE 78 406* 225*  BUN 29* 37* 57*  CREATININE 2.79* 3.31* 3.29*  CALCIUM 8.9 8.7* 8.9   Liver Function Tests: Recent Labs  Lab 08/15/20 0913  AST 24  ALT 16  ALKPHOS 55  BILITOT 0.4  PROT 6.1*  ALBUMIN 3.0*   No results for input(s): LIPASE, AMYLASE in the last 168 hours. No results for input(s): AMMONIA in the last 168 hours. CBC: Recent Labs  Lab 08/15/20 0913 08/15/20 1358 08/16/20 0823 08/17/20 0240  WBC 6.8  --  10.1 11.9*  HGB 11.6* 11.3* 10.7* 10.0*  HCT 37.3* 35.3* 33.7* 31.0*  MCV 94.7  --  92.1 91.7  PLT 234   --  215 200   Cardiac Enzymes: No results for input(s): CKTOTAL, CKMB, CKMBINDEX, TROPONINI in the last 168 hours. BNP: Invalid input(s): POCBNP CBG: Recent Labs  Lab 08/16/20 1105 08/16/20 1603 08/16/20 2120 08/17/20 0541 08/17/20 1100  GLUCAP 344* 183* 262* 198* 148*   D-Dimer No results for input(s): DDIMER in the last 72 hours. Hgb A1c No results for input(s): HGBA1C in the last 72 hours. Lipid Profile No results for input(s): CHOL, HDL, LDLCALC, TRIG, CHOLHDL, LDLDIRECT in the last 72 hours. Thyroid function studies No results for input(s): TSH, T4TOTAL, T3FREE, THYROIDAB in the last 72 hours.  Invalid input(s): FREET3 Anemia work up No results for input(s): VITAMINB12, FOLATE, FERRITIN, TIBC, IRON, RETICCTPCT in the last 72 hours. Urinalysis    Component Value Date/Time   COLORURINE STRAW (A) 08/15/2020 1600   APPEARANCEUR CLEAR 08/15/2020 1600   LABSPEC 1.009 08/15/2020 1600   PHURINE 5.0 08/15/2020 1600   GLUCOSEU 50 (A) 08/15/2020 1600   HGBUR NEGATIVE 08/15/2020 1600   BILIRUBINUR NEGATIVE 08/15/2020 1600   KETONESUR NEGATIVE 08/15/2020 1600   PROTEINUR >=300 (A) 08/15/2020 1600   NITRITE NEGATIVE 08/15/2020 1600   LEUKOCYTESUR NEGATIVE 08/15/2020 1600   Sepsis Labs Invalid input(s): PROCALCITONIN,  WBC,  LACTICIDVEN Microbiology Recent Results (from the past 240 hour(s))  SARS Coronavirus 2 by RT PCR (hospital order, performed in [facility] hospital lab) Nasopharyngeal Nasopharyngeal Swab     Status: None   Collection Time: 08/15/20  9:41 AM   Specimen: Nasopharyngeal Swab  Result Value Ref Range Status   SARS Coronavirus 2 NEGATIVE NEGATIVE Final    Comment: (NOTE) SARS-CoV-2 target nucleic acids are NOT DETECTED.  The SARS-CoV-2 RNA is generally detectable in upper and lower respiratory specimens during the acute phase of infection. The lowest concentration of SARS-CoV-2 viral copies this assay can detect is 250 copies / mL. A negative result  does not preclude SARS-CoV-2  infection and should not be used as the sole basis for treatment or other patient management decisions.  A negative result may occur with improper specimen collection / handling, submission of specimen other than nasopharyngeal swab, presence of viral mutation(s) within the areas targeted by this assay, and inadequate number of viral copies (<250 copies / mL). A negative result must be combined with clinical observations, patient history, and epidemiological information.  Fact Sheet for Patients:   StrictlyIdeas.no  Fact Sheet for Healthcare Providers: BankingDealers.co.za  This test is not yet approved or  cleared by the [place] FDA and has been authorized for detection and/or diagnosis of SARS-CoV-2 by FDA under an Emergency Use Authorization (EUA).  This EUA will remain in effect (meaning this test can be used) for the duration of the COVID-19 declaration under Section 564(b)(1) of the Act, 21 U.S.C. section 360bbb-3(b)(1), unless the authorization is terminated or revoked sooner.  Performed at [facility], [place] [address] [place], [place] [address]      Time coordinating discharge: Over 30 minutes  SIGNED:   Deshondra Worst J [place] DO  [facility] 08/17/2020, 11:40 AM

## 2020-08-17 NOTE — Plan of Care (Signed)
Pt understanding of discharge instructions  

## 2020-08-18 ENCOUNTER — Other Ambulatory Visit: Payer: Self-pay | Admitting: Cardiology

## 2020-08-18 DIAGNOSIS — L821 Other seborrheic keratosis: Secondary | ICD-10-CM | POA: Diagnosis not present

## 2020-08-18 DIAGNOSIS — L918 Other hypertrophic disorders of the skin: Secondary | ICD-10-CM | POA: Diagnosis not present

## 2020-08-18 DIAGNOSIS — I10 Essential (primary) hypertension: Secondary | ICD-10-CM

## 2020-08-18 DIAGNOSIS — L570 Actinic keratosis: Secondary | ICD-10-CM | POA: Diagnosis not present

## 2020-08-18 DIAGNOSIS — Z85828 Personal history of other malignant neoplasm of skin: Secondary | ICD-10-CM | POA: Diagnosis not present

## 2020-08-19 DIAGNOSIS — E118 Type 2 diabetes mellitus with unspecified complications: Secondary | ICD-10-CM | POA: Diagnosis not present

## 2020-08-19 DIAGNOSIS — D443 Neoplasm of uncertain behavior of pituitary gland: Secondary | ICD-10-CM | POA: Diagnosis not present

## 2020-08-19 DIAGNOSIS — I10 Essential (primary) hypertension: Secondary | ICD-10-CM | POA: Diagnosis not present

## 2020-08-19 DIAGNOSIS — E890 Postprocedural hypothyroidism: Secondary | ICD-10-CM | POA: Diagnosis not present

## 2020-08-19 DIAGNOSIS — E1122 Type 2 diabetes mellitus with diabetic chronic kidney disease: Secondary | ICD-10-CM | POA: Diagnosis not present

## 2020-08-22 DIAGNOSIS — N400 Enlarged prostate without lower urinary tract symptoms: Secondary | ICD-10-CM | POA: Diagnosis not present

## 2020-08-22 DIAGNOSIS — I129 Hypertensive chronic kidney disease with stage 1 through stage 4 chronic kidney disease, or unspecified chronic kidney disease: Secondary | ICD-10-CM | POA: Diagnosis not present

## 2020-08-22 DIAGNOSIS — E1122 Type 2 diabetes mellitus with diabetic chronic kidney disease: Secondary | ICD-10-CM | POA: Diagnosis not present

## 2020-08-22 DIAGNOSIS — M069 Rheumatoid arthritis, unspecified: Secondary | ICD-10-CM | POA: Diagnosis not present

## 2020-08-22 DIAGNOSIS — N184 Chronic kidney disease, stage 4 (severe): Secondary | ICD-10-CM | POA: Diagnosis not present

## 2020-08-22 DIAGNOSIS — E785 Hyperlipidemia, unspecified: Secondary | ICD-10-CM | POA: Diagnosis not present

## 2020-08-22 DIAGNOSIS — E039 Hypothyroidism, unspecified: Secondary | ICD-10-CM | POA: Diagnosis not present

## 2020-08-22 DIAGNOSIS — Z7189 Other specified counseling: Secondary | ICD-10-CM | POA: Diagnosis not present

## 2020-08-23 DIAGNOSIS — J449 Chronic obstructive pulmonary disease, unspecified: Secondary | ICD-10-CM | POA: Diagnosis not present

## 2020-08-23 DIAGNOSIS — R9389 Abnormal findings on diagnostic imaging of other specified body structures: Secondary | ICD-10-CM | POA: Diagnosis not present

## 2020-08-23 DIAGNOSIS — M7989 Other specified soft tissue disorders: Secondary | ICD-10-CM | POA: Diagnosis not present

## 2020-08-23 DIAGNOSIS — Z09 Encounter for follow-up examination after completed treatment for conditions other than malignant neoplasm: Secondary | ICD-10-CM | POA: Diagnosis not present

## 2020-08-23 DIAGNOSIS — E11649 Type 2 diabetes mellitus with hypoglycemia without coma: Secondary | ICD-10-CM | POA: Diagnosis not present

## 2020-08-23 DIAGNOSIS — F17200 Nicotine dependence, unspecified, uncomplicated: Secondary | ICD-10-CM | POA: Diagnosis not present

## 2020-08-25 ENCOUNTER — Telehealth: Payer: Self-pay | Admitting: Pharmacist

## 2020-08-25 NOTE — Telephone Encounter (Signed)
Called to follow up for [facility] check-in. Med list reviewed and updated. Pt had a recent Nephrology and PCP appt. Lasix dose increased per nephrology recommendation. Pt was also transitioned from glimepiride and victoza to tradjenta. Called and requested labs from pt's nephrology office. Pt has follow up with nephrologist to further manage his CKD.

## 2020-08-26 NOTE — Progress Notes (Signed)
External Labs: Collected: 08/22/2020 Creatinine 3.08 mg/dL. eGFR: 20 mL/min per 1.73 m AST/ALT: 25/24 Albumin 3.5 Magnesium 2.1 Hemoglobin 11.2 g/dL

## 2020-08-27 ENCOUNTER — Other Ambulatory Visit: Payer: Self-pay | Admitting: Cardiology

## 2020-08-27 DIAGNOSIS — I10 Essential (primary) hypertension: Secondary | ICD-10-CM

## 2020-08-29 ENCOUNTER — Other Ambulatory Visit (HOSPITAL_COMMUNITY): Payer: Self-pay | Admitting: Internal Medicine

## 2020-08-29 DIAGNOSIS — N184 Chronic kidney disease, stage 4 (severe): Secondary | ICD-10-CM

## 2020-09-01 DIAGNOSIS — I10 Essential (primary) hypertension: Secondary | ICD-10-CM | POA: Diagnosis not present

## 2020-09-05 DIAGNOSIS — I10 Essential (primary) hypertension: Secondary | ICD-10-CM | POA: Diagnosis not present

## 2020-09-08 ENCOUNTER — Other Ambulatory Visit: Payer: Self-pay | Admitting: Cardiology

## 2020-09-08 ENCOUNTER — Ambulatory Visit: Payer: Medicare Other | Admitting: Cardiology

## 2020-09-08 ENCOUNTER — Encounter: Payer: Self-pay | Admitting: Cardiology

## 2020-09-08 ENCOUNTER — Other Ambulatory Visit: Payer: Self-pay

## 2020-09-08 VITALS — BP 133/52 | HR 58 | Ht 69.0 in | Wt 295.0 lb

## 2020-09-08 DIAGNOSIS — I10 Essential (primary) hypertension: Secondary | ICD-10-CM | POA: Diagnosis not present

## 2020-09-08 DIAGNOSIS — N184 Chronic kidney disease, stage 4 (severe): Secondary | ICD-10-CM | POA: Diagnosis not present

## 2020-09-08 DIAGNOSIS — E1165 Type 2 diabetes mellitus with hyperglycemia: Secondary | ICD-10-CM | POA: Diagnosis not present

## 2020-09-08 DIAGNOSIS — E782 Mixed hyperlipidemia: Secondary | ICD-10-CM | POA: Diagnosis not present

## 2020-09-08 DIAGNOSIS — E1122 Type 2 diabetes mellitus with diabetic chronic kidney disease: Secondary | ICD-10-CM

## 2020-09-08 DIAGNOSIS — F17200 Nicotine dependence, unspecified, uncomplicated: Secondary | ICD-10-CM | POA: Diagnosis not present

## 2020-09-08 DIAGNOSIS — Z9989 Dependence on other enabling machines and devices: Secondary | ICD-10-CM

## 2020-09-08 DIAGNOSIS — G4733 Obstructive sleep apnea (adult) (pediatric): Secondary | ICD-10-CM | POA: Diagnosis not present

## 2020-09-08 DIAGNOSIS — M7989 Other specified soft tissue disorders: Secondary | ICD-10-CM

## 2020-09-08 DIAGNOSIS — Z6841 Body Mass Index (BMI) 40.0 and over, adult: Secondary | ICD-10-CM | POA: Diagnosis not present

## 2020-09-08 MED ORDER — NIFEDIPINE ER OSMOTIC RELEASE 60 MG PO TB24: 60.0000 mg | ORAL_TABLET | Freq: Every day | ORAL | 3 refills | Status: DC

## 2020-09-08 NOTE — Progress Notes (Signed)
 Firman D Denicola Jr. Date of Birth: 01/17/1951 MRN: [id number] Primary Care Provider:Collins, Dana, DO Former Cardiology Providers: Dr. Jay Ganji Primary Cardiologist:  , DO, FACC (established care 07/07/2020)  Date: 09/08/20 Last Office Visit: 08/05/2020  Chief Complaint  Patient presents with  . Hypertension    HPI  Dwayne D Bayles Jr. is a 69 y.o.  male who presents to the office with a chief complaint of " follow-up for blood pressure." Patient's past medical history and cardiovascular risk factors include: Diabetes mellitus type 2, GERD, hyperlipidemia, hypertension, nonsecretory pituitary macroadenoma, rheumatoid arthritis, bronchitis, active tobacco use, advanced age, obesity due to excess calories.  Patient is referred to the office at the request of his primary care provider Dr. Collins for evaluation and management of hypertension.  Patient was formally under the care of Dr. Jay Ganji and reestablish care with myself as of 07/07/2020.   Prior to establishing care with myself patient systolic blood pressures are very uncontrolled.  He used to have blood pressure readings as high as 198/87.  His medications have been uptitrated and his blood pressure at today's office visit 135/52.  Patient has also been enrolled into ambulatory blood pressure monitoring and his blood pressures have been improving.    Since last office visit patient was hospitalized secondary to symptoms due to low blood sugars.  He is also established care with nephrology and has an upcoming renal biopsy on September 13, 2020.  He is making every effort to lose weight as well.  Since last visit he has lost approximately 11 pounds.   FUNCTIONAL STATUS: No structured exercise program or daily routine.  ALLERGIES: Allergies  Allergen Reactions  . Atorvastatin Other (See Comments)    unknown  . Farxiga [Dapagliflozin] Other (See Comments)    Yeast infection   . Folic Acid Hives  . Humira [Adalimumab] Other  (See Comments)    Caused a stroke   . Metformin And Related Other (See Comments)    Weak if takes 1000 mg  . Plavix [Clopidogrel Bisulfate] Hives  . Simvastatin Other (See Comments)    Couldn't think straight     MEDICATION LIST PRIOR TO VISIT: Current Outpatient Medications on File Prior to Visit  Medication Sig Dispense Refill  . albuterol (PROVENTIL HFA;VENTOLIN HFA) 108 (90 Base) MCG/ACT inhaler Inhale 1-2 puffs into the lungs every 6 (six) hours as needed for wheezing or shortness of breath.    . doxazosin (CARDURA) 2 MG tablet Take 2 mg by mouth 2 (two) times daily.     . EPINEPHrine 0.3 mg/0.3 mL IJ SOAJ injection Inject 0.3 mLs (0.3 mg total) into the muscle as needed for up to 2 doses. (Patient taking differently: Inject 0.3 mg into the muscle as needed for anaphylaxis. ) 2 Device 0  . escitalopram (LEXAPRO) 10 MG tablet Take 10 mg by mouth daily.    . furosemide (LASIX) 80 MG tablet Take 80 mg by mouth 2 (two) times daily.     . hydrALAZINE (APRESOLINE) 100 MG tablet Take 1 tablet (100 mg total) by mouth 3 (three) times daily. 270 tablet 0  . labetalol (NORMODYNE) 200 MG tablet Take 1 tablet (200 mg total) by mouth 2 (two) times daily. 180 tablet 0  . leflunomide (ARAVA) 10 MG tablet Take 10 mg by mouth daily.     . levothyroxine (SYNTHROID, LEVOTHROID) 200 MCG tablet Take 200 mcg by mouth daily before breakfast.     . ONETOUCH VERIO test strip 3 (three) times daily.    .   pravastatin (PRAVACHOL) 40 MG tablet Take 40 mg by mouth daily.     . predniSONE (DELTASONE) 5 MG tablet Take 5 mg by mouth daily as needed (arthritis).     . Tofacitinib Citrate (XELJANZ) 5 MG TABS Take 5 mg by mouth 2 (two) times daily.    . TRADJENTA 5 MG TABS tablet Take 5 mg by mouth daily.    Marland Kitchen aspirin EC 81 MG tablet Take 81 mg by mouth daily.  (Patient not taking: Reported on 09/08/2020)    . diclofenac Sodium (VOLTAREN) 1 % GEL Apply 2 g topically 4 (four) times daily as needed (pain).      No current  facility-administered medications on file prior to visit.    PAST MEDICAL HISTORY: Past Medical History:  Diagnosis Date  . Asthma   . Chronic kidney disease   . Diabetes mellitus without complication ([place])   . Hyperlipidemia   . Hypertension   . Hypothyroidism   . Obesity   . Pituitary macroadenoma ([place])   . Rheumatoid arthritis ([place])   . Sleep apnea     PAST SURGICAL HISTORY: Past Surgical History:  Procedure Laterality Date  . Arthroscopic knee surgery Right 1998  . Gamma knife surgery  2011  . HAND SURGERY    . THYROIDECTOMY    . TONSILLECTOMY    . TUMOR REMOVAL     Pituitary    FAMILY HISTORY: The patient's family history includes Emphysema in his father; Healthy in his brother, sister, and sister; Hypertension in his mother.   SOCIAL HISTORY:  The patient  reports that he has been smoking cigarettes. He has a 40.00 pack-year smoking history. He has never used smokeless tobacco. He reports current alcohol use. He reports previous drug use. Drug: Marijuana.  Review of Systems  Constitutional: Negative for chills and fever.  HENT: Negative for hoarse voice and nosebleeds.   Eyes: Negative for discharge, double vision and pain.  Cardiovascular: Positive for dyspnea on exertion (improved. ) and leg swelling (improving). Negative for chest pain, claudication, near-syncope, orthopnea, palpitations, paroxysmal nocturnal dyspnea and syncope.  Respiratory: Negative for hemoptysis and shortness of breath.   Musculoskeletal: Negative for muscle cramps and myalgias.  Gastrointestinal: Negative for abdominal pain, constipation, diarrhea, hematemesis, hematochezia, melena, nausea and vomiting.  Neurological: Negative for dizziness and light-headedness.   PHYSICAL EXAM: Vitals with BMI 09/08/2020 09/08/2020 08/17/2020  Height - 5' 9" -  Weight - 295 lbs -  BMI - 01.75 -  Systolic 102 585 277  Diastolic 52 44 61  Pulse 58 58 62   CONSTITUTIONAL: Well-developed and  well-nourished. No acute distress.  SKIN: Skin is warm and dry. No rash noted. No cyanosis. No pallor. No jaundice HEAD: Normocephalic and atraumatic.  EYES: No scleral icterus MOUTH/THROAT: Moist oral membranes.  NECK: No JVD present. No thyromegaly noted. No carotid bruits  LYMPHATIC: No visible cervical adenopathy.  CHEST Normal respiratory effort. No intercostal retractions  LUNGS: Decreased breath sounds bilaterally.  No stridor. No wheezes. No rales.  CARDIOVASCULAR: Regular positives S1-S2, no gallops rubs or murmurs appreciated ABDOMINAL: Obese soft, nontender, nondistended, positive bowel sounds all 4 quadrants, no apparent ascites.  EXTREMITIES: 1+ bilateral pitting peripheral edema  HEMATOLOGIC: No significant bruising NEUROLOGIC: Oriented to person, place, and time. Nonfocal. Normal muscle tone.  PSYCHIATRIC: Normal mood and affect. Normal behavior. Cooperative  CARDIAC DATABASE: EKG: 07/14/2020: Sinus bradycardia, 57 beats per minute, incomplete right bundle branch block without underlying injury pattern.  Echocardiogram: 08/01/2020:  Left ventricle cavity  is normal in size. Mild concentric hypertrophy of the left ventricle. Normal global [place] motion. Normal LV systolic function with visual EF 55-60%. Doppler evidence of grade II (pseudonormal)  diastolic dysfunction, elevated LAP.  Left atrial cavity is moderately dilated.  Mild (Grade I) mitral regurgitation.  Mild tricuspid regurgitation. Estimated pulmonary artery systolic pressure  34 mmHg.   Stress Testing:  January 2014: Myocardial perfusion imaging: Perfusion images reveal a moderate sized inferior and apical nontransmural scar without superimposed ischemia.  LVEF 61%.  Low risk study clinical correlation recommended in a patient who weighs 260 pounds and a BMI of 46.  Heart Catheterization: None  Carotid duplex: 06/22/2018: Minimal stenosis of the left external carotid artery (less than 50%) bilateral antegrade  vertebral artery flow.  Lower extremity arterial duplex: 06/22/2018: Biphasic waveform in the right proximal SFA and below suggest diffuse disease no hemodynamically significant stenosis identified in the left lower extremity.  ABI mildly reduced in the right lower extremity.  Normal ABI suggestive of normal perfusion in the left lower extremity.  LABORATORY DATA: External Labs: Collected: 07/04/2020 Creatinine 2.9 mg/dL. eGFR: 23 mL/min per 1.73 m Sodium 146, BUN 36, BUN to creatinine ratio 12.2 TSH: 0.59   Lipid profile: Collected: 05/03/2020 Total cholesterol 181, triglycerides 285, HDL 57, LDL 67, non-HDL 124  CBC Latest Ref Rng & Units 08/17/2020 08/16/2020 08/15/2020  WBC 4.0 - 10.5 K/uL 11.9(H) 10.1 -  Hemoglobin 13.0 - 17.0 g/dL 10.0(L) 10.7(L) 11.3(L)  Hematocrit 39 - 52 % 31.0(L) 33.7(L) 35.3(L)  Platelets 150 - 400 K/uL 200 215 -   LABORATORY DATA: CBC Latest Ref Rng & Units 08/17/2020 08/16/2020 08/15/2020  WBC 4.0 - 10.5 K/uL 11.9(H) 10.1 -  Hemoglobin 13.0 - 17.0 g/dL 10.0(L) 10.7(L) 11.3(L)  Hematocrit 39 - 52 % 31.0(L) 33.7(L) 35.3(L)  Platelets 150 - 400 K/uL 200 215 -    CMP Latest Ref Rng & Units 08/17/2020 08/16/2020 08/15/2020  Glucose 70 - 99 mg/dL 225(H) 406(H) 78  BUN 8 - 23 mg/dL 57(H) 37(H) 29(H)  Creatinine 0.61 - 1.24 mg/dL 3.29(H) 3.31(H) 2.79(H)  Sodium 135 - 145 mmol/L 137 136 143  Potassium 3.5 - 5.1 mmol/L 3.9 4.1 3.3(L)  Chloride 98 - 111 mmol/L 103 100 108  CO2 22 - 32 mmol/L _0 Calcium 8.9 - 10.3 mg/dL 8.9 8.7(L) 8.9  Total Protein 6.5 - 8.1 g/dL - - 6.1(L)  Total Bilirubin 0.3 - 1.2 mg/dL - - 0.4  Alkaline Phos 38 - 126 U/L - - 55  AST 15 - 41 U/L - - 24  ALT 0 - 44 U/L - - 16    Lipid Panel  No results found for: CHOL, TRIG, HDL, CHOLHDL, VLDL, LDLCALC, LDLDIRECT, LABVLDL  No components found for: NTPROBNP Recent Labs    08/01/20 1039  PROBNP 1,100*   Recent Labs    07/11/20 1851  TSH 0.616    HEMOGLOBIN A1C Lab Results   Component Value Date   HGBA1C 6.6 (H) 07/11/2020   MPG 142.72 07/11/2020   IMPRESSION:    ICD-10-CM   1. Benign hypertension  I10 NIFEdipine (PROCARDIA XL/NIFEDICAL XL) 60 MG 24 hr tablet  2. Type 2 diabetes mellitus with hyperglycemia, without long-term current use of insulin (HCC)  E11.65   3. Type 2 diabetes mellitus with stage 4 chronic kidney disease, without long-term current use of insulin (HCC)  E11.22    N18.4   4. Leg swelling  M79.89   5. Mixed hyperlipidemia  E78.2  6. Smoking  F17.200   7. Class 3 severe obesity due to excess calories with serious comorbidity and body mass index (BMI) of 40.0 to 44.9 in adult (HCC)  E66.01    Z68.41   8. OSA on CPAP  G47.33    Z99.89      RECOMMENDATIONS: Dacen D Lodes Jr. is a 69 y.o. male whose past medical history and cardiovascular risk factors include: Diabetes mellitus type 2, GERD, hyperlipidemia, hypertension, nonsecretory pituitary macroadenoma, rheumatoid arthritis, bronchitis, active tobacco use, advanced age, obesity due to excess calories.  Benign essential hypertension:  Home medications reconciled.  In the past losartan was discontinued due to chronic kidney disease.  Continue hydralazine 100 mg p.o. 3 times daily.  Continue Isordil to 40 mg p.o. 3 times daily  Continue labetalol 200 mg p.o. twice daily.  Continue Procardia XL 60 mg p.o. daily.  Re-encouraged the importance of a low-salt diet  Patient is enrolled into principal care management for ambulatory blood pressure monitoring.  Office blood pressures are well controlled.  No new medication changes at today's visit.  He is also lost 11 pounds since last visit for which he is congratulated for.  Patient is asked to follow-up with his nephrologist for diuretic and CKD management as scheduled.  I will see him back in 3 months or sooner if needed.  Continue remote blood pressure monitoring.  Dyspnea on exertion: Patient states that his symptoms  are overall improving and most likely secondary to underlying pneumonia which he was treated during his hospitalization.  We discussed undergoing stress test at this point patient would like to hold off on this at the given time given the fact that he was recently hospitalized and has an upcoming kidney biopsy..  Will reevaluate at the next visit.  Lower extremity swelling: Improving  Lower extremity duplex negative for DVT.  Hyperlipidemia: Continue statin therapy.  Managed per primary team  Tobacco use: Educated on importance of complete smoking cessation.  Patient does not appear to be motivated to stop smoking.   FINAL MEDICATION LIST END OF ENCOUNTER:   Current Outpatient Medications:  .  acetaminophen (TYLENOL) 650 MG CR tablet, Take 1,300 mg by mouth every 8 (eight) hours as needed for pain., Disp: , Rfl:  .  albuterol (PROVENTIL HFA;VENTOLIN HFA) 108 (90 Base) MCG/ACT inhaler, Inhale 1-2 puffs into the lungs every 6 (six) hours as needed for wheezing or shortness of breath., Disp: , Rfl:  .  doxazosin (CARDURA) 2 MG tablet, Take 2 mg by mouth 2 (two) times daily. , Disp: , Rfl:  .  EPINEPHrine 0.3 mg/0.3 mL IJ SOAJ injection, Inject 0.3 mLs (0.3 mg total) into the muscle as needed for up to 2 doses. (Patient taking differently: Inject 0.3 mg into the muscle as needed for anaphylaxis. ), Disp: 2 Device, Rfl: 0 .  escitalopram (LEXAPRO) 10 MG tablet, Take 10 mg by mouth daily., Disp: , Rfl:  .  furosemide (LASIX) 80 MG tablet, Take 80 mg by mouth 2 (two) times daily. , Disp: , Rfl:  .  hydrALAZINE (APRESOLINE) 100 MG tablet, Take 1 tablet (100 mg total) by mouth 3 (three) times daily., Disp: 270 tablet, Rfl: 0 .  labetalol (NORMODYNE) 200 MG tablet, Take 1 tablet (200 mg total) by mouth 2 (two) times daily., Disp: 180 tablet, Rfl: 0 .  leflunomide (ARAVA) 10 MG tablet, Take 10 mg by mouth daily. , Disp: , Rfl:  .  levothyroxine (SYNTHROID, LEVOTHROID) 200 MCG tablet, Take 200   mcg by  mouth daily before breakfast. , Disp: , Rfl:  .  NIFEdipine (PROCARDIA XL/NIFEDICAL XL) 60 MG 24 hr tablet, Take 1 tablet (60 mg total) by mouth daily., Disp: 30 tablet, Rfl: 3 .  ONETOUCH VERIO test strip, 3 (three) times daily., Disp: , Rfl:  .  potassium chloride (KLOR-CON) 10 MEQ tablet, Take 10 mEq by mouth daily with lunch., Disp: , Rfl:  .  pravastatin (PRAVACHOL) 40 MG tablet, Take 40 mg by mouth daily. , Disp: , Rfl:  .  predniSONE (DELTASONE) 5 MG tablet, Take 5 mg by mouth daily as needed (arthritis). , Disp: , Rfl:  .  Tofacitinib Citrate (XELJANZ) 5 MG TABS, Take 5 mg by mouth 2 (two) times daily., Disp: , Rfl:  .  TRADJENTA 5 MG TABS tablet, Take 5 mg by mouth daily., Disp: , Rfl:  .  aspirin EC 81 MG tablet, Take 81 mg by mouth daily.  (Patient not taking: Reported on 09/08/2020), Disp: , Rfl:  .  diclofenac Sodium (VOLTAREN) 1 % GEL, Apply 2 g topically 4 (four) times daily as needed (pain). , Disp: , Rfl:  .  isosorbide dinitrate (ISORDIL) 30 MG tablet, TAKE 2 TABLETS (60 MG TOTAL) BY MOUTH 3 (THREE) TIMES DAILY., Disp: 180 tablet, Rfl: 2  No orders of the defined types were placed in this encounter.   --Continue cardiac medications as reconciled in final medication list. --Return in about 3 months (around 12/09/2020) for Follow up, BP. Or sooner if needed. --Continue follow-up with your primary care physician regarding the management of your other chronic comorbid conditions.  Patient's questions and concerns were addressed to his satisfaction. He voices understanding of the instructions provided during this encounter.   This note was created using a voice recognition software as a result there may be grammatical errors inadvertently enclosed that do not reflect the nature of this encounter. Every attempt is made to correct such errors.  Total time spent: 30 minutes.  During hospitalization records, medication reconciliation, disease management discussion.  Rex Kras, [place],  [facility]  Pager: [phone] Office: [phone]

## 2020-09-12 ENCOUNTER — Other Ambulatory Visit: Payer: Self-pay | Admitting: Student

## 2020-09-13 ENCOUNTER — Ambulatory Visit (HOSPITAL_COMMUNITY)
Admission: RE | Admit: 2020-09-13 | Discharge: 2020-09-13 | Disposition: A | Discharge status: Home / Self Care | Payer: Medicare Other | Source: Ambulatory Visit | Attending: Internal Medicine | Admitting: Internal Medicine

## 2020-09-13 ENCOUNTER — Other Ambulatory Visit: Payer: Self-pay

## 2020-09-13 DIAGNOSIS — Z7902 Long term (current) use of antithrombotics/antiplatelets: Secondary | ICD-10-CM | POA: Insufficient documentation

## 2020-09-13 DIAGNOSIS — D352 Benign neoplasm of pituitary gland: Secondary | ICD-10-CM | POA: Diagnosis not present

## 2020-09-13 DIAGNOSIS — Z79899 Other long term (current) drug therapy: Secondary | ICD-10-CM | POA: Insufficient documentation

## 2020-09-13 DIAGNOSIS — Z79890 Hormone replacement therapy (postmenopausal): Secondary | ICD-10-CM | POA: Insufficient documentation

## 2020-09-13 DIAGNOSIS — F17210 Nicotine dependence, cigarettes, uncomplicated: Secondary | ICD-10-CM | POA: Diagnosis not present

## 2020-09-13 DIAGNOSIS — I129 Hypertensive chronic kidney disease with stage 1 through stage 4 chronic kidney disease, or unspecified chronic kidney disease: Secondary | ICD-10-CM | POA: Diagnosis not present

## 2020-09-13 DIAGNOSIS — R944 Abnormal results of kidney function studies: Secondary | ICD-10-CM | POA: Diagnosis not present

## 2020-09-13 DIAGNOSIS — Z888 Allergy status to other drugs, medicaments and biological substances status: Secondary | ICD-10-CM | POA: Diagnosis not present

## 2020-09-13 DIAGNOSIS — J45909 Unspecified asthma, uncomplicated: Secondary | ICD-10-CM | POA: Insufficient documentation

## 2020-09-13 DIAGNOSIS — Z8249 Family history of ischemic heart disease and other diseases of the circulatory system: Secondary | ICD-10-CM | POA: Diagnosis not present

## 2020-09-13 DIAGNOSIS — Z825 Family history of asthma and other chronic lower respiratory diseases: Secondary | ICD-10-CM | POA: Insufficient documentation

## 2020-09-13 DIAGNOSIS — E785 Hyperlipidemia, unspecified: Secondary | ICD-10-CM | POA: Insufficient documentation

## 2020-09-13 DIAGNOSIS — G4730 Sleep apnea, unspecified: Secondary | ICD-10-CM | POA: Diagnosis not present

## 2020-09-13 DIAGNOSIS — N184 Chronic kidney disease, stage 4 (severe): Secondary | ICD-10-CM

## 2020-09-13 DIAGNOSIS — E1122 Type 2 diabetes mellitus with diabetic chronic kidney disease: Secondary | ICD-10-CM | POA: Diagnosis not present

## 2020-09-13 DIAGNOSIS — E890 Postprocedural hypothyroidism: Secondary | ICD-10-CM | POA: Insufficient documentation

## 2020-09-13 DIAGNOSIS — E669 Obesity, unspecified: Secondary | ICD-10-CM | POA: Diagnosis not present

## 2020-09-13 DIAGNOSIS — Z6841 Body Mass Index (BMI) 40.0 and over, adult: Secondary | ICD-10-CM | POA: Diagnosis not present

## 2020-09-13 DIAGNOSIS — M069 Rheumatoid arthritis, unspecified: Secondary | ICD-10-CM | POA: Insufficient documentation

## 2020-09-13 DIAGNOSIS — I10 Essential (primary) hypertension: Secondary | ICD-10-CM

## 2020-09-13 LAB — PROTIME-INR
INR: 1.2 (ref 0.8–1.2)
Prothrombin Time: 14.8 seconds (ref 11.4–15.2)

## 2020-09-13 LAB — CBC
HCT: 33.9 % — ABNORMAL LOW (ref 39.0–52.0)
Hemoglobin: 10.7 g/dL — ABNORMAL LOW (ref 13.0–17.0)
MCH: 29.6 pg (ref 26.0–34.0)
MCHC: 31.6 g/dL (ref 30.0–36.0)
MCV: 93.9 fL (ref 80.0–100.0)
Platelets: 213 10*3/uL (ref 150–400)
RBC: 3.61 MIL/uL — ABNORMAL LOW (ref 4.22–5.81)
RDW: 14.5 % (ref 11.5–15.5)
WBC: 5.9 10*3/uL (ref 4.0–10.5)
nRBC: 0 % (ref 0.0–0.2)

## 2020-09-13 LAB — GLUCOSE, CAPILLARY: Glucose-Capillary: 119 mg/dL — ABNORMAL HIGH (ref 70–99)

## 2020-09-13 MED ORDER — LIDOCAINE HCL (PF) 1 % IJ SOLN
INTRAMUSCULAR | Status: AC
  Filled 2020-09-13: qty 30

## 2020-09-13 MED ORDER — MIDAZOLAM HCL 2 MG/2ML IJ SOLN
INTRAMUSCULAR | Status: AC | PRN
  Administered 2020-09-13 (×2): 1 mg via INTRAVENOUS

## 2020-09-13 MED ORDER — FENTANYL CITRATE (PF) 100 MCG/2ML IJ SOLN
INTRAMUSCULAR | Status: AC | PRN
  Administered 2020-09-13 (×2): 50 ug via INTRAVENOUS

## 2020-09-13 MED ORDER — SODIUM CHLORIDE 0.9 % IV SOLN: INTRAVENOUS | Status: DC

## 2020-09-13 MED ORDER — MIDAZOLAM HCL 2 MG/2ML IJ SOLN
INTRAMUSCULAR | Status: AC
  Filled 2020-09-13: qty 2

## 2020-09-13 MED ORDER — GELATIN ABSORBABLE 12-7 MM EX MISC
CUTANEOUS | Status: AC
  Filled 2020-09-13: qty 1

## 2020-09-13 MED ORDER — HYDRALAZINE HCL 20 MG/ML IJ SOLN
INTRAMUSCULAR | Status: AC
  Filled 2020-09-13: qty 1

## 2020-09-13 MED ORDER — FENTANYL CITRATE (PF) 100 MCG/2ML IJ SOLN
INTRAMUSCULAR | Status: AC
  Filled 2020-09-13: qty 2

## 2020-09-13 MED ORDER — NITROGLYCERIN 0.4 MG SL SUBL
SUBLINGUAL_TABLET | SUBLINGUAL | Status: AC
  Filled 2020-09-13: qty 2

## 2020-09-13 MED ORDER — HYDROCODONE-ACETAMINOPHEN 5-325 MG PO TABS: 1.0000 | ORAL_TABLET | ORAL | Status: DC | PRN

## 2020-09-13 NOTE — Procedures (Signed)
Interventional Radiology Procedure Note  Procedure: Ultrasound guided renal biopsy  Findings: Please refer to procedural dictation for full description. Right core biopsy, inferior pole.  16 ga x3.  Samples placed in formalin and sent to Pathology.  Complications: None immediate.  Estimated Blood Loss: <5 mL  Recommendations: Bedrest for 3 hours. Resume aspirin tomorrow. Follow up biopsy results.   Ruthann Cancer, MD

## 2020-09-13 NOTE — H&P (Signed)
Chief Complaint: Persistent elevation in creatinine. Request is for kidney biopsy  Referring Physician(s): Peeples,Samuel J  Supervising Physician: Dr. Gretta Cool Patient Status: [facility] - Out-pt  History of Present Illness: [name]. is a 69 y.o. male outpatient. History of DM, HTN, GERD, RA, consecratory pituitary macroadenoma. Lab work shows a persistent underlying kidney issues with creatinine elevation since 2019 . Patient is being followed by [facility]. Team is request a kidney biopsy for further evaluation.     Past Medical History:  Diagnosis Date  . Asthma   . Chronic kidney disease   . Diabetes mellitus without complication ([place])   . Hyperlipidemia   . Hypertension   . Hypothyroidism   . Obesity   . Pituitary macroadenoma ([place])   . Rheumatoid arthritis ([place])   . Sleep apnea     Past Surgical History:  Procedure Laterality Date  . Arthroscopic knee surgery Right 1998  . Gamma knife surgery  2011  . HAND SURGERY    . THYROIDECTOMY    . TONSILLECTOMY    . TUMOR REMOVAL     Pituitary    Allergies: Atorvastatin, Farxiga [dapagliflozin], Folic acid, Humira [adalimumab], Metformin and related, Plavix [clopidogrel bisulfate], and Simvastatin  Medications: Prior to Admission medications   Medication Sig Start Date End Date Taking? Authorizing Provider  acetaminophen (TYLENOL) 650 MG CR tablet Take 1,300 mg by mouth every 8 (eight) hours as needed for pain.   Yes [provider]  albuterol (PROVENTIL HFA;VENTOLIN HFA) 108 (90 Base) MCG/ACT inhaler Inhale 1-2 puffs into the lungs every 6 (six) hours as needed for wheezing or shortness of breath.   Yes [provider]  aspirin EC 81 MG tablet Take 81 mg by mouth daily.    Yes [provider]  doxazosin (CARDURA) 2 MG tablet Take 2 mg by mouth 2 (two) times daily.    Yes [provider]  escitalopram (LEXAPRO) 10 MG tablet Take 10 mg by mouth daily.   Yes  [provider]  furosemide (LASIX) 80 MG tablet Take 80 mg by mouth 2 (two) times daily.  06/18/20  Yes [provider]  hydrALAZINE (APRESOLINE) 100 MG tablet Take 1 tablet (100 mg total) by mouth 3 (three) times daily. 07/12/20 10/10/20 Yes [place] Eric J, DO  isosorbide dinitrate (ISORDIL) 30 MG tablet TAKE 2 TABLETS (60 MG TOTAL) BY MOUTH 3 (THREE) TIMES DAILY. 09/08/20  Yes Tolia, Sunit, DO  labetalol (NORMODYNE) 200 MG tablet Take 1 tablet (200 mg total) by mouth 2 (two) times daily. 08/05/20 11/03/20 Yes Tolia, Sunit, DO  leflunomide (ARAVA) 10 MG tablet Take 10 mg by mouth daily.  06/25/20  Yes [provider]  levothyroxine (SYNTHROID, LEVOTHROID) 200 MCG tablet Take 200 mcg by mouth daily before breakfast.    Yes [provider]  NIFEdipine (PROCARDIA XL/NIFEDICAL XL) 60 MG 24 hr tablet Take 1 tablet (60 mg total) by mouth daily. 09/08/20  Yes Tolia, Sunit, DO  potassium chloride (KLOR-CON) 10 MEQ tablet Take 10 mEq by mouth daily with lunch.   Yes [provider]  pravastatin (PRAVACHOL) 40 MG tablet Take 40 mg by mouth daily.    Yes [provider]  predniSONE (DELTASONE) 5 MG tablet Take 5 mg by mouth daily as needed (arthritis).  06/02/20  Yes [provider]  Tofacitinib Citrate (XELJANZ) 5 MG TABS Take 5 mg by mouth 2 (two) times daily.   Yes [provider]  TRADJENTA 5 MG TABS  tablet Take 5 mg by mouth daily. 08/23/20  Yes [provider]  diclofenac Sodium (VOLTAREN) 1 % GEL Apply 2 g topically 4 (four) times daily as needed (pain).     [provider]  EPINEPHrine 0.3 mg/0.3 mL IJ SOAJ injection Inject 0.3 mLs (0.3 mg total) into the muscle as needed for up to 2 doses. Patient taking differently: Inject 0.3 mg into the muscle as needed for anaphylaxis.  08/06/18   Irven [place], MD  [facility] VERIO test strip 3 (three) times daily. 07/23/20   [provider]     Family History  Problem  Relation Age of Onset  . Hypertension Mother   . Emphysema Father   . Healthy Sister   . Healthy Brother   . Healthy Sister     Social History   Socioeconomic History  . Marital status: Married    Spouse name: Not on file  . Number of children: 2  . Years of education: Not on file  . Highest education level: Not on file  Occupational History  . Not on file  Tobacco Use  . Smoking status: Current Every Day Smoker    Packs/day: 1.00    Years: 40.00    Pack years: 40.00    Types: Cigarettes  . Smokeless tobacco: Never Used  Vaping Use  . Vaping Use: Never used  Substance and Sexual Activity  . Alcohol use: Yes    Comment: occasionally  . Drug use: Not Currently    Types: Marijuana  . Sexual activity: Not on file  Other Topics Concern  . Not on file  Social History Narrative  . Not on file   Social Determinants of Health   Financial Resource Strain:   . Difficulty of Paying Living Expenses: Not on file  Food Insecurity:   . Worried About Charity fundraiser in the Last Year: Not on file  . Ran Out of Food in the Last Year: Not on file  Transportation Needs:   . Lack of Transportation (Medical): Not on file  . Lack of Transportation (Non-Medical): Not on file  Physical Activity:   . Days of Exercise per Week: Not on file  . Minutes of Exercise per Session: Not on file  Stress:   . Feeling of Stress : Not on file  Social Connections:   . Frequency of Communication with Friends and Family: Not on file  . Frequency of Social Gatherings with Friends and Family: Not on file  . Attends Religious Services: Not on file  . Active Member of Clubs or Organizations: Not on file  . Attends Archivist Meetings: Not on file  . Marital Status: Not on file     Review of Systems: A 12 point ROS discussed and pertinent positives are indicated in the HPI above.  All other systems are negative.  Review of Systems  Constitutional: Negative for fever.  HENT: Negative  for congestion.   Respiratory: Negative for cough and shortness of breath.   Cardiovascular: Negative for chest pain.  Gastrointestinal: Negative for abdominal pain.  Genitourinary: Positive for urgency.  Neurological: Negative for headaches.  Psychiatric/Behavioral: Negative for behavioral problems and confusion.    Vital Signs: BP (!) 172/60   Pulse (!) 58   Temp 97.8 F (36.6 C) (Oral)   Resp 20   Ht 5\' 8"  (1.727 m)   Wt 295 lb (133.8 kg)   SpO2 100%   BMI 44.85 kg/m   Physical Exam Vitals and nursing  note reviewed.  Constitutional:      Appearance: He is well-developed.  HENT:     Head: Normocephalic.  Cardiovascular:     Rate and Rhythm: Normal rate and regular rhythm.     Heart sounds: Normal heart sounds.  Pulmonary:     Effort: Pulmonary effort is normal.     Breath sounds: Normal breath sounds.  Musculoskeletal:        General: Normal range of motion.     Cervical back: Normal range of motion.  Skin:    General: Skin is dry.  Neurological:     Mental Status: He is alert and oriented to person, place, and time.     Imaging: DG Chest Port 1 View  Result Date: 08/15/2020 CLINICAL DATA:  Shortness of breath EXAM: PORTABLE CHEST 1 VIEW COMPARISON:  July 10, 2020 FINDINGS: There is slight interstitial thickening in the bases. There is an area of ill-defined opacity in the right upper lobe. The lungs elsewhere are clear. There is mild cardiomegaly with pulmonary vascularity normal. No adenopathy. No bone lesions. Surgical clips are noted in the left cervical-thoracic region, likely due to hemithyroidectomy. IMPRESSION: 1. Ill-defined opacity right upper lobe concerning for early developing pneumonia right upper lobe. Bibasilar interstitial thickening is stable and may reflect a degree of underlying chronic bronchitis. There is cardiomegaly. Pulmonary vascularity is normal. No adenopathy. Electronically Signed   By: Lowella Grip III M.D.   On: 08/15/2020 10:48     Labs:  CBC: Recent Labs    07/12/20 0252 07/12/20 0252 08/15/20 0913 08/15/20 1358 08/16/20 0823 08/17/20 0240  WBC 7.9  --  6.8  --  10.1 11.9*  HGB 12.5*   < > 11.6* 11.3* 10.7* 10.0*  HCT 38.5*   < > 37.3* 35.3* 33.7* 31.0*  PLT 218  --  234  --  215 200   < > = values in this interval not displayed.    COAGS: Recent Labs    08/15/20 1651  INR 1.2  APTT 30    BMP: Recent Labs    08/01/20 1038 08/15/20 0913 08/16/20 0823 08/17/20 0240  NA 145* 143 136 137  K 3.8 3.3* 4.1 3.9  CL 108* 108 100 103  CO2 23 26 23 23   GLUCOSE 87 78 406* 225*  BUN 31* 29* 37* 57*  CALCIUM 8.9 8.9 8.7* 8.9  CREATININE 2.67* 2.79* 3.31* 3.29*  GFRNONAA 24* 22* 18* 18*  GFRAA 27* 26* 21* 21*    LIVER FUNCTION TESTS: Recent Labs    08/15/20 0913  BILITOT 0.4  AST 24  ALT 16  ALKPHOS 55  PROT 6.1*  ALBUMIN 3.0*    Assessment and Plan:  69 y.o. male outpatient. History of DM, HTN, GERD, RA, consecratory pituitary macroadenoma. Lab work shows a persistent underlying kidney issues with creatinine elevation since 2019 . Patient is being followed by [facility]. Team is request a kidney biopsy for further evaluation.     Last pertinent imaging from 10.3.2016  US abdomen shows bilateral kidneys unremarkable. All labs and medications are within acceptable parameters. No pertinent allergies. Patient has been NPO since [place].    Risks and benefits of kidney biopsy was discussed with the patient and/or patient's family including, but not limited to bleeding, infection, damage to adjacent structures or low yield requiring additional tests.  All of the questions were answered and there is agreement to proceed.  Consent signed and in chart.   Thank you for this interesting  consult.  I greatly enjoyed meeting Federick Levene. and look forward to participating in their care.  A copy of this report was sent to the requesting provider on this  date.  Electronically Signed: Jacqualine Mau, NP 09/13/2020, 7:19 AM   I spent a total of  30 Minutes   in face to face in clinical consultation, greater than 50% of which was counseling/coordinating care for kidney biopsy

## 2020-09-13 NOTE — Progress Notes (Signed)
Patient was given discharge instructions. He verbalized understanding. 

## 2020-09-13 NOTE — Discharge Instructions (Signed)
Percutaneous Kidney Biopsy, Care After This sheet gives you information about how to care for yourself after your procedure. Your health care provider may also give you more specific instructions. If you have problems or questions, contact your health care provider. What can I expect after the procedure? After the procedure, it is common to have:  Pain or soreness near the biopsy site.  Pink or cloudy urine for 24 hours after the procedure. Follow these instructions at home: Activity  Return to your normal activities as told by your health care provider. Ask your health care provider what activities are safe for you.  If you were given a sedative during the procedure, it can affect you for several hours. Do not drive or operate machinery until your health care provider says that it is safe.  Do not lift anything that is heavier than 10 lb (4.5 kg), or the limit that you are told, until your health care provider says that it is safe.  Avoid activities that take a lot of effort (are strenuous) until your health care provider approves. Most people will have to wait 2 weeks before returning to activities such as exercise or sex. General instructions   Take over-the-counter and prescription medicines only as told by your health care provider.  You may eat and drink after your procedure. Follow instructions from your health care provider about eating or drinking restrictions.  Check your biopsy site every day for signs of infection. Check for: ? More redness, swelling, or pain. ? Fluid or blood. ? Warmth. ? Pus or a bad smell.  Keep all follow-up visits as told by your health care provider. This is important. Contact a health care provider if:  You have more redness, swelling, or pain around your biopsy site.  You have fluid or blood coming from your biopsy site.  Your biopsy site feels warm to the touch.  You have pus or a bad smell coming from your biopsy site.  You have blood  in your urine more than 24 hours after your procedure.  You have a fever. Get help right away if:  Your urine is dark red or brown.  You cannot urinate.  It burns when you urinate.  You feel dizzy or light-headed.  You have severe pain in your abdomen or side. Summary  After the procedure, it is common to have pain or soreness at the biopsy site and pink or cloudy urine for the first 24 hours.  Check your biopsy site each day for signs of infection, such as more redness, swelling, or pain; fluid, blood, pus or a bad smell coming from the biopsy site; or the biopsy site feeling warm to touch.  Return to your normal activities as told by your health care provider. This information is not intended to replace advice given to you by your health care provider. Make sure you discuss any questions you have with your health care provider. Document Revised: 07/23/2019 Document Reviewed: 07/23/2019 Elsevier Patient Education  2020 [employer]  

## 2020-09-14 ENCOUNTER — Telehealth: Payer: Self-pay | Admitting: Gastroenterology

## 2020-09-14 NOTE — Telephone Encounter (Signed)
Pt's wife called to cancel pt's procedure with Dr. Tarri Glenn on 11/16 at the hospital. Pt will cb to r/s. at a later time.

## 2020-09-15 NOTE — Telephone Encounter (Signed)
Appt cancelled per pt request, case [id number]

## 2020-09-20 ENCOUNTER — Encounter (HOSPITAL_COMMUNITY): Payer: Self-pay | Admitting: Internal Medicine

## 2020-09-20 DIAGNOSIS — E1122 Type 2 diabetes mellitus with diabetic chronic kidney disease: Secondary | ICD-10-CM | POA: Diagnosis not present

## 2020-09-20 DIAGNOSIS — M069 Rheumatoid arthritis, unspecified: Secondary | ICD-10-CM | POA: Diagnosis not present

## 2020-09-20 DIAGNOSIS — N400 Enlarged prostate without lower urinary tract symptoms: Secondary | ICD-10-CM | POA: Diagnosis not present

## 2020-09-20 DIAGNOSIS — E118 Type 2 diabetes mellitus with unspecified complications: Secondary | ICD-10-CM | POA: Diagnosis not present

## 2020-09-20 DIAGNOSIS — N184 Chronic kidney disease, stage 4 (severe): Secondary | ICD-10-CM | POA: Diagnosis not present

## 2020-09-20 DIAGNOSIS — I10 Essential (primary) hypertension: Secondary | ICD-10-CM | POA: Diagnosis not present

## 2020-09-20 DIAGNOSIS — E039 Hypothyroidism, unspecified: Secondary | ICD-10-CM | POA: Diagnosis not present

## 2020-09-20 DIAGNOSIS — D443 Neoplasm of uncertain behavior of pituitary gland: Secondary | ICD-10-CM | POA: Diagnosis not present

## 2020-09-20 DIAGNOSIS — I129 Hypertensive chronic kidney disease with stage 1 through stage 4 chronic kidney disease, or unspecified chronic kidney disease: Secondary | ICD-10-CM | POA: Diagnosis not present

## 2020-09-20 DIAGNOSIS — E785 Hyperlipidemia, unspecified: Secondary | ICD-10-CM | POA: Diagnosis not present

## 2020-09-20 LAB — SURGICAL PATHOLOGY

## 2020-09-22 NOTE — Addendum Note (Signed)
Addended by: Manuela Schwartz T on: 09/22/2020 10:22 AM   Modules accepted: Orders

## 2020-09-27 DIAGNOSIS — Z79899 Other long term (current) drug therapy: Secondary | ICD-10-CM | POA: Diagnosis not present

## 2020-09-27 DIAGNOSIS — M1990 Unspecified osteoarthritis, unspecified site: Secondary | ICD-10-CM | POA: Diagnosis not present

## 2020-09-27 DIAGNOSIS — I10 Essential (primary) hypertension: Secondary | ICD-10-CM | POA: Diagnosis not present

## 2020-09-27 DIAGNOSIS — E119 Type 2 diabetes mellitus without complications: Secondary | ICD-10-CM | POA: Diagnosis not present

## 2020-09-27 DIAGNOSIS — M179 Osteoarthritis of knee, unspecified: Secondary | ICD-10-CM | POA: Diagnosis not present

## 2020-09-27 DIAGNOSIS — M0589 Other rheumatoid arthritis with rheumatoid factor of multiple sites: Secondary | ICD-10-CM | POA: Diagnosis not present

## 2020-09-27 DIAGNOSIS — E790 Hyperuricemia without signs of inflammatory arthritis and tophaceous disease: Secondary | ICD-10-CM | POA: Diagnosis not present

## 2020-09-28 DIAGNOSIS — N184 Chronic kidney disease, stage 4 (severe): Secondary | ICD-10-CM | POA: Diagnosis not present

## 2020-09-28 DIAGNOSIS — E7800 Pure hypercholesterolemia, unspecified: Secondary | ICD-10-CM | POA: Diagnosis not present

## 2020-09-28 DIAGNOSIS — E039 Hypothyroidism, unspecified: Secondary | ICD-10-CM | POA: Diagnosis not present

## 2020-09-28 DIAGNOSIS — C73 Malignant neoplasm of thyroid gland: Secondary | ICD-10-CM | POA: Diagnosis not present

## 2020-09-28 DIAGNOSIS — E118 Type 2 diabetes mellitus with unspecified complications: Secondary | ICD-10-CM | POA: Diagnosis not present

## 2020-09-28 DIAGNOSIS — I10 Essential (primary) hypertension: Secondary | ICD-10-CM | POA: Diagnosis not present

## 2020-09-28 DIAGNOSIS — D443 Neoplasm of uncertain behavior of pituitary gland: Secondary | ICD-10-CM | POA: Diagnosis not present

## 2020-10-02 DIAGNOSIS — I10 Essential (primary) hypertension: Secondary | ICD-10-CM | POA: Diagnosis not present

## 2020-10-10 DIAGNOSIS — N183 Chronic kidney disease, stage 3 unspecified: Secondary | ICD-10-CM | POA: Diagnosis not present

## 2020-10-14 ENCOUNTER — Other Ambulatory Visit (HOSPITAL_COMMUNITY): Payer: Medicare Other

## 2020-10-18 ENCOUNTER — Ambulatory Visit (HOSPITAL_COMMUNITY): Admit: 2020-10-18 | Payer: Medicare Other | Admitting: Gastroenterology

## 2020-10-18 ENCOUNTER — Encounter (HOSPITAL_COMMUNITY): Payer: Self-pay

## 2020-10-18 SURGERY — COLONOSCOPY WITH PROPOFOL
Anesthesia: Monitor Anesthesia Care

## 2020-10-31 ENCOUNTER — Other Ambulatory Visit: Payer: Self-pay | Admitting: Cardiology

## 2020-10-31 DIAGNOSIS — I10 Essential (primary) hypertension: Secondary | ICD-10-CM

## 2020-11-01 ENCOUNTER — Other Ambulatory Visit: Payer: Self-pay | Admitting: Cardiology

## 2020-11-01 DIAGNOSIS — I10 Essential (primary) hypertension: Secondary | ICD-10-CM | POA: Diagnosis not present

## 2020-11-02 DIAGNOSIS — R21 Rash and other nonspecific skin eruption: Secondary | ICD-10-CM | POA: Diagnosis not present

## 2020-11-04 ENCOUNTER — Other Ambulatory Visit: Payer: Self-pay | Admitting: Cardiology

## 2020-11-09 DIAGNOSIS — R21 Rash and other nonspecific skin eruption: Secondary | ICD-10-CM | POA: Diagnosis not present

## 2020-11-09 DIAGNOSIS — L299 Pruritus, unspecified: Secondary | ICD-10-CM | POA: Diagnosis not present

## 2020-11-24 IMAGING — CT CT HEAD W/O CM
3 of 4 series · 15 of 47 positions shown, 18 images · non-contrast
Comparison: MRI dated 02/14/2015.  CT head dated December 21, 2008.

CLINICAL DATA: Benign intracranial hypertension.

EXAM:
CT HEAD WITHOUT CONTRAST
TECHNIQUE: Contiguous axial images were obtained from the base of the skull
through the vertex without intravenous contrast.

[Series 4: head 2.0 h70h · axial · 0.43mm/px · z∈[-47,+81]mm · 9 of 80 slices shown, 12 images]
[im 8/80  brain]
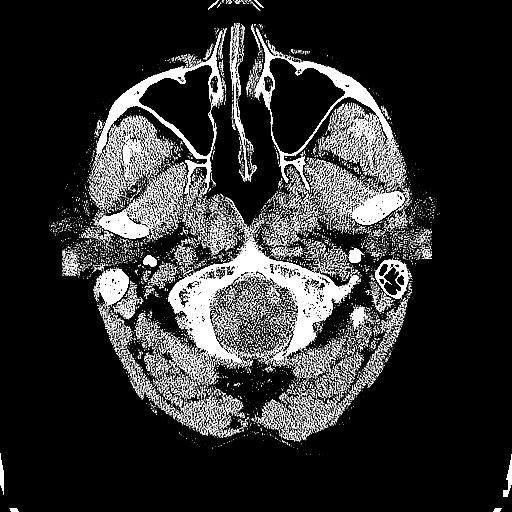
[im 8/80  bone]
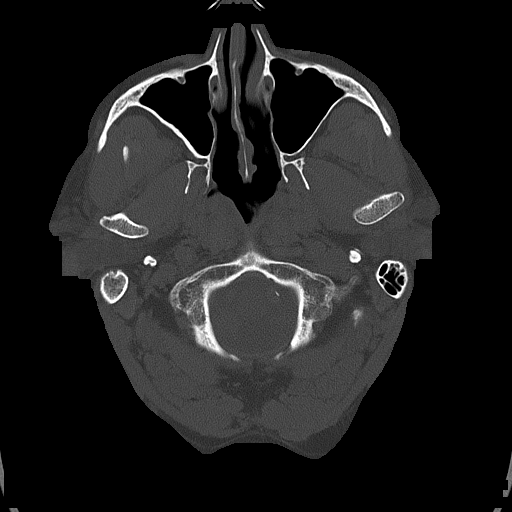
[im 16/80  brain]
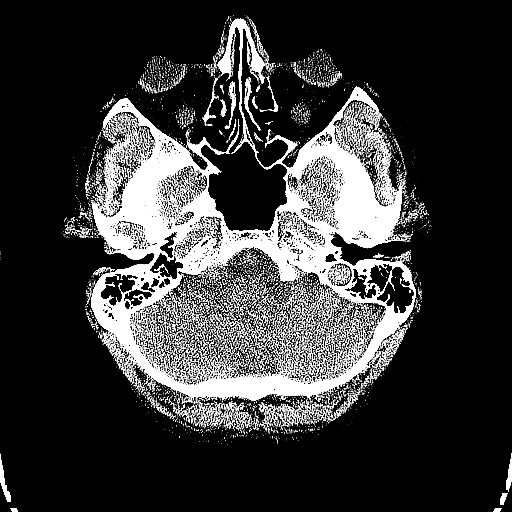
[im 24/80  brain]
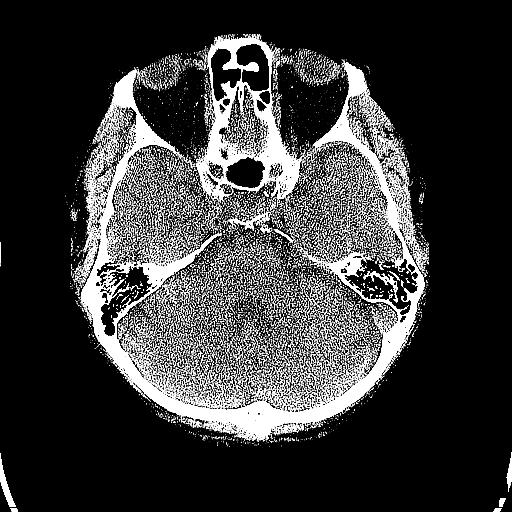
[im 32/80  brain]
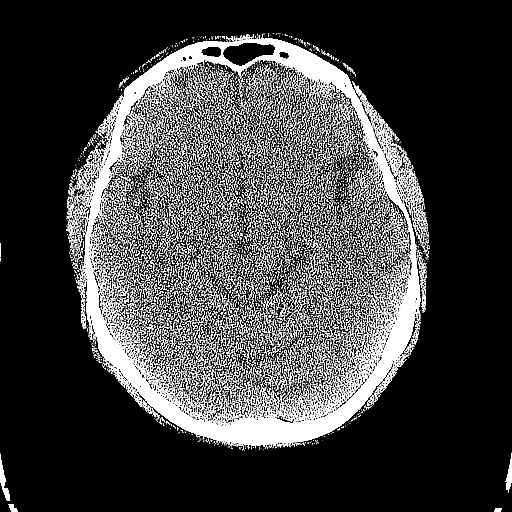
[im 40/80  brain]
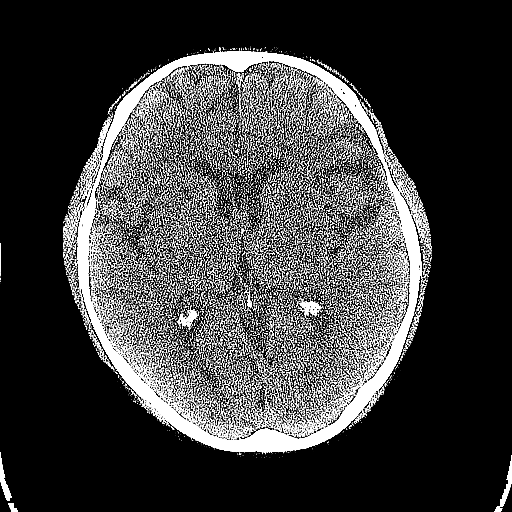
[im 40/80  bone]
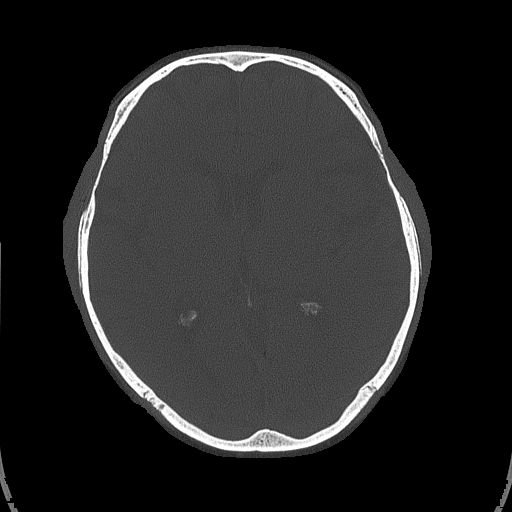
[im 48/80  brain]
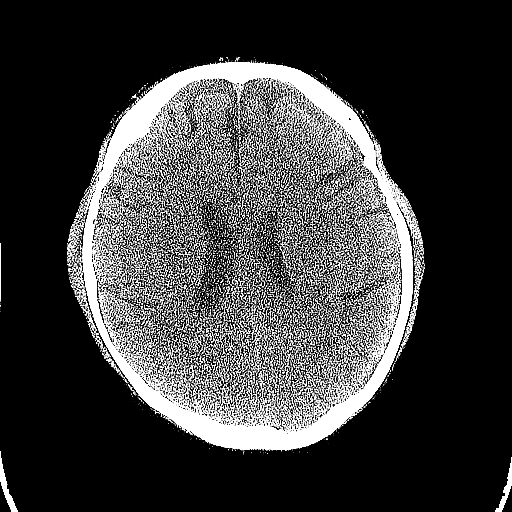
[im 56/80  brain]
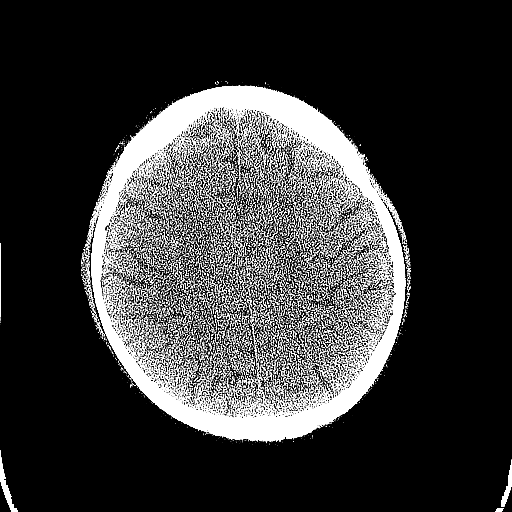
[im 64/80  brain]
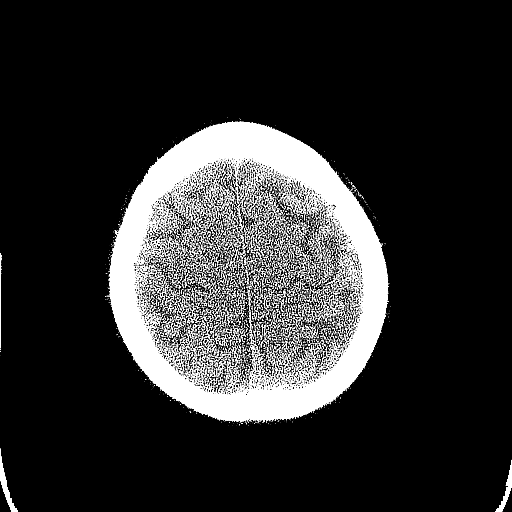
[im 72/80  brain]
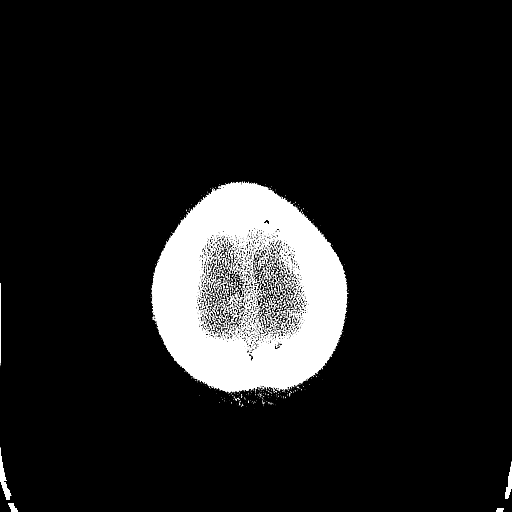
[im 72/80  bone]
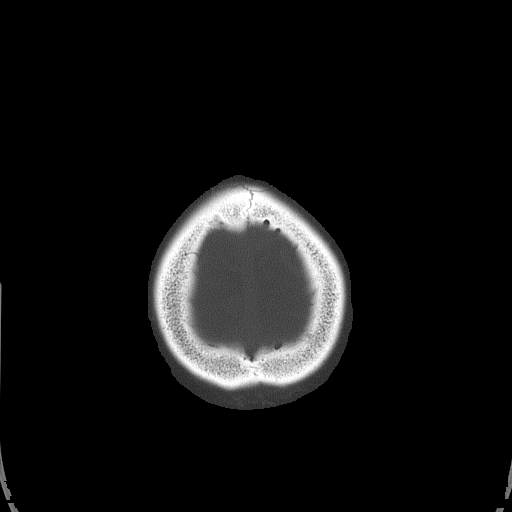

[Series 5: head 3.0 mpr cor · coronal · 0.36mm/px · 3 of 67 slices shown]
[im 23/67  brain]
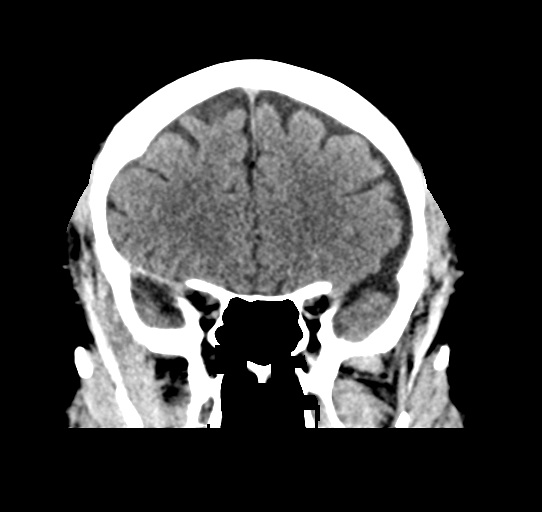
[im 30/67  brain]
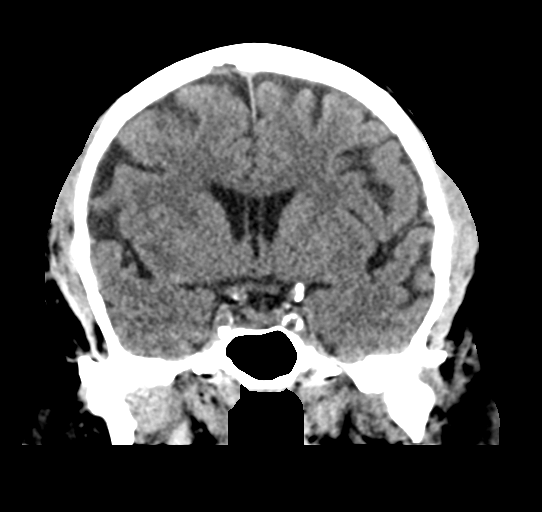
[im 37/67  brain]
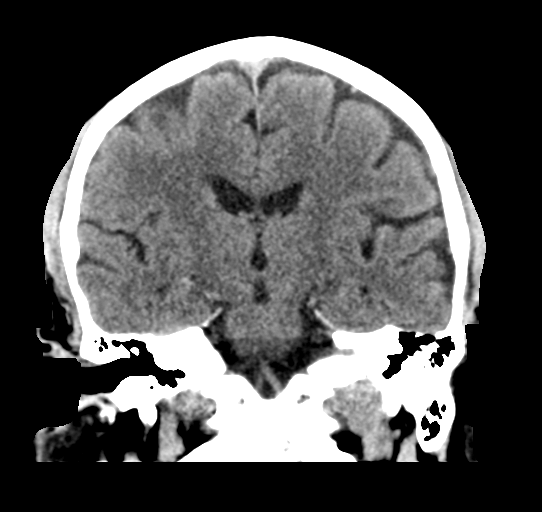

[Series 6: head 3.0 mpr sag · sagittal · 0.38mm/px · 3 of 61 slices shown]
[im 21/61  brain]
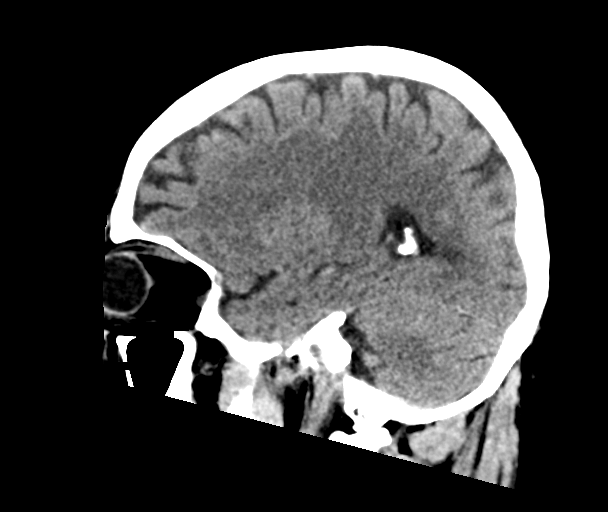
[im 31/61  brain]
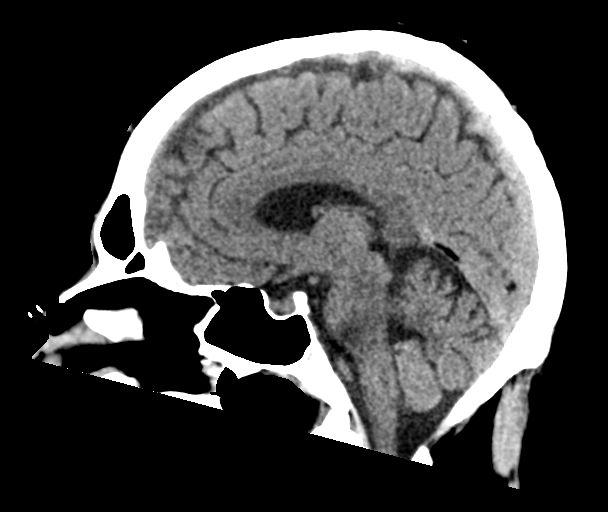
[im 41/61  brain]
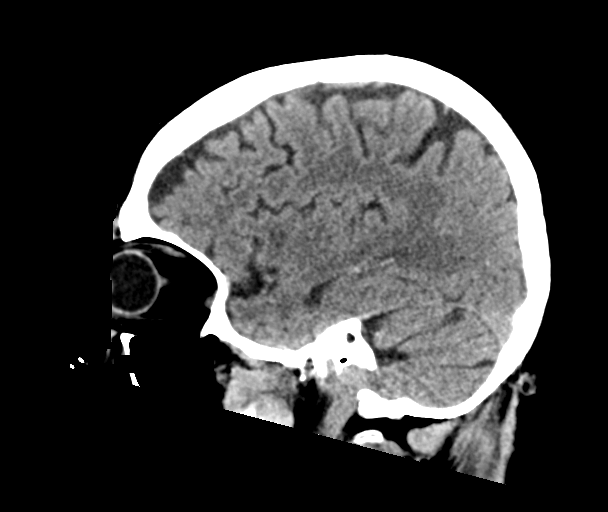

[15 of 47 positions shown; findings below may reference images not displayed]

FINDINGS: Brain: No hemorrhage. No extraaxial collection.No midline shift.
Mild atrophy is noted.The basal cisterns are unremarkable. Patchy
and confluent areas of decreased attenuation are noted throughout
the deep and periventricular white matter of the cerebral
hemispheres bilaterally, compatible with chronic microvascular
ischemic disease. The brainstem is unremarkable. The cerebellum is
unremarkable. The sella is unremarkable.

Vascular: No hyperdense vessel or unexpected calcification.

Skull: The calvarium is unremarkable. The skull base is
unremarkable. The visualized upper cervical spine is unremarkable.

Sinuses/Orbits: The visualized orbits are unremarkable. The
paranasal sinuses are unremarkable. The mastoid air cells are clear.

Other: The visualized parotid gland is unremarkable. There is no
scalp soft tissue swelling.
IMPRESSION: 1. No acute intracranial abnormality.
2. Mild atrophy and chronic microvascular ischemic changes of the
cerebral white matter.

## 2020-11-29 ENCOUNTER — Other Ambulatory Visit: Payer: Self-pay | Admitting: Cardiology

## 2020-11-29 DIAGNOSIS — I10 Essential (primary) hypertension: Secondary | ICD-10-CM

## 2020-12-02 DIAGNOSIS — I10 Essential (primary) hypertension: Secondary | ICD-10-CM | POA: Diagnosis not present

## 2020-12-07 ENCOUNTER — Ambulatory Visit: Payer: PRIVATE HEALTH INSURANCE | Admitting: Cardiology

## 2020-12-20 DIAGNOSIS — N184 Chronic kidney disease, stage 4 (severe): Secondary | ICD-10-CM | POA: Diagnosis not present

## 2020-12-20 DIAGNOSIS — M0589 Other rheumatoid arthritis with rheumatoid factor of multiple sites: Secondary | ICD-10-CM | POA: Diagnosis not present

## 2020-12-20 DIAGNOSIS — Z79899 Other long term (current) drug therapy: Secondary | ICD-10-CM | POA: Diagnosis not present

## 2020-12-22 DIAGNOSIS — E039 Hypothyroidism, unspecified: Secondary | ICD-10-CM | POA: Diagnosis not present

## 2020-12-22 DIAGNOSIS — N184 Chronic kidney disease, stage 4 (severe): Secondary | ICD-10-CM | POA: Diagnosis not present

## 2020-12-22 DIAGNOSIS — N400 Enlarged prostate without lower urinary tract symptoms: Secondary | ICD-10-CM | POA: Diagnosis not present

## 2020-12-22 DIAGNOSIS — E1122 Type 2 diabetes mellitus with diabetic chronic kidney disease: Secondary | ICD-10-CM | POA: Diagnosis not present

## 2020-12-22 DIAGNOSIS — M069 Rheumatoid arthritis, unspecified: Secondary | ICD-10-CM | POA: Diagnosis not present

## 2020-12-22 DIAGNOSIS — Z7185 Encounter for immunization safety counseling: Secondary | ICD-10-CM | POA: Diagnosis not present

## 2020-12-22 DIAGNOSIS — I129 Hypertensive chronic kidney disease with stage 1 through stage 4 chronic kidney disease, or unspecified chronic kidney disease: Secondary | ICD-10-CM | POA: Diagnosis not present

## 2020-12-22 DIAGNOSIS — E785 Hyperlipidemia, unspecified: Secondary | ICD-10-CM | POA: Diagnosis not present

## 2020-12-28 ENCOUNTER — Ambulatory Visit: Payer: PRIVATE HEALTH INSURANCE | Admitting: Cardiology

## 2020-12-28 DIAGNOSIS — M179 Osteoarthritis of knee, unspecified: Secondary | ICD-10-CM | POA: Diagnosis not present

## 2020-12-28 DIAGNOSIS — I10 Essential (primary) hypertension: Secondary | ICD-10-CM | POA: Diagnosis not present

## 2020-12-28 DIAGNOSIS — E790 Hyperuricemia without signs of inflammatory arthritis and tophaceous disease: Secondary | ICD-10-CM | POA: Diagnosis not present

## 2020-12-28 DIAGNOSIS — M1990 Unspecified osteoarthritis, unspecified site: Secondary | ICD-10-CM | POA: Diagnosis not present

## 2020-12-28 DIAGNOSIS — M0589 Other rheumatoid arthritis with rheumatoid factor of multiple sites: Secondary | ICD-10-CM | POA: Diagnosis not present

## 2020-12-28 DIAGNOSIS — Z79899 Other long term (current) drug therapy: Secondary | ICD-10-CM | POA: Diagnosis not present

## 2020-12-28 DIAGNOSIS — E119 Type 2 diabetes mellitus without complications: Secondary | ICD-10-CM | POA: Diagnosis not present

## 2020-12-28 DIAGNOSIS — M79643 Pain in unspecified hand: Secondary | ICD-10-CM | POA: Diagnosis not present

## 2020-12-29 IMAGING — DX DG CHEST 1V PORT
1 series · 1 of 1 positions shown · non-contrast
Comparison: July 10, 2020

CLINICAL DATA: Shortness of breath

EXAM:
PORTABLE CHEST 1 VIEW

[chest]
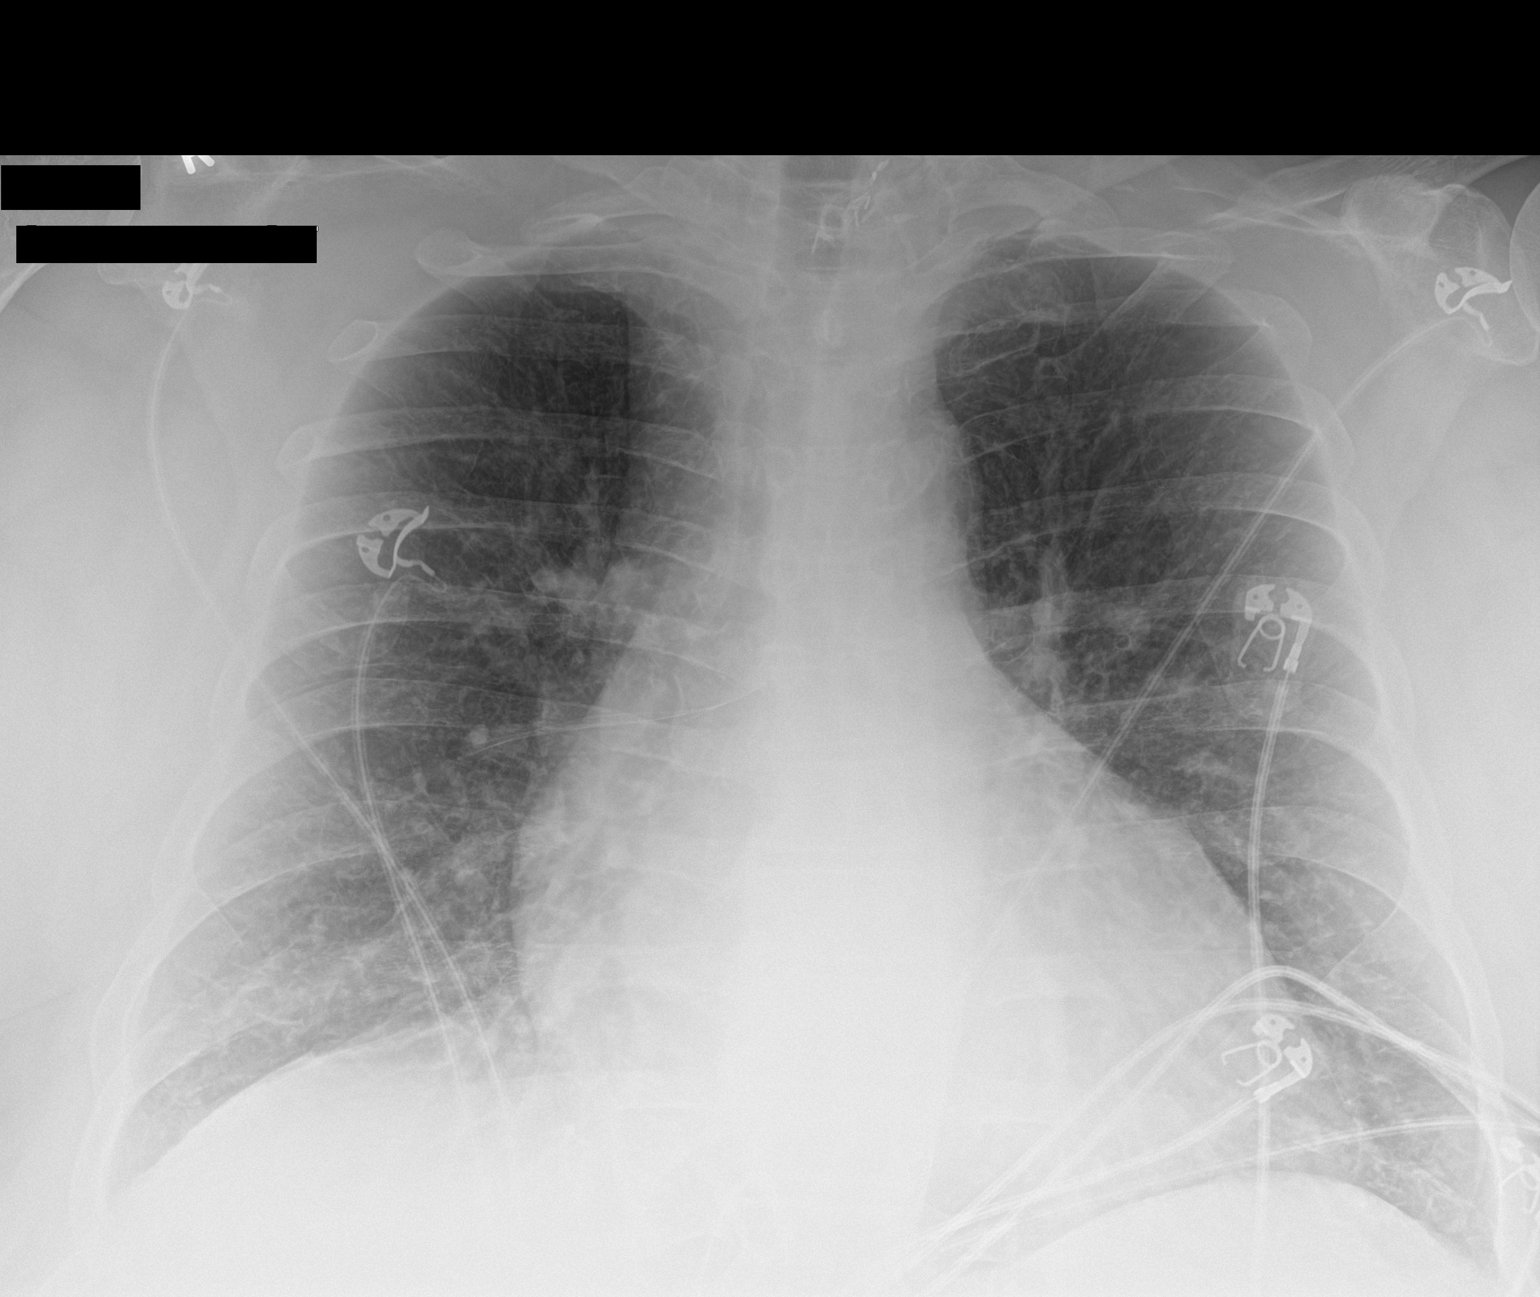

[1 of 1 positions shown; findings below may reference images not displayed]

FINDINGS: There is slight interstitial thickening in the bases. There is an
area of ill-defined opacity in the right upper lobe. The lungs
elsewhere are clear. There is mild cardiomegaly with pulmonary
vascularity normal. No adenopathy. No bone lesions. Surgical clips
are noted in the left cervical-thoracic region, likely due to
hemithyroidectomy.
IMPRESSION: 1. Ill-defined opacity right upper lobe concerning for early
developing pneumonia right upper lobe. Bibasilar interstitial
thickening is stable and may reflect a degree of underlying chronic
bronchitis. There is cardiomegaly. Pulmonary vascularity is normal.
No adenopathy.

## 2020-12-31 ENCOUNTER — Other Ambulatory Visit: Payer: Self-pay | Admitting: Cardiology

## 2020-12-31 DIAGNOSIS — I10 Essential (primary) hypertension: Secondary | ICD-10-CM

## 2021-01-02 ENCOUNTER — Ambulatory Visit: Payer: PRIVATE HEALTH INSURANCE | Admitting: Cardiology

## 2021-01-02 DIAGNOSIS — I10 Essential (primary) hypertension: Secondary | ICD-10-CM | POA: Diagnosis not present

## 2021-01-11 ENCOUNTER — Encounter: Payer: Self-pay | Admitting: Cardiology

## 2021-01-11 ENCOUNTER — Ambulatory Visit: Payer: Medicare Other | Admitting: Cardiology

## 2021-01-11 ENCOUNTER — Other Ambulatory Visit: Payer: Self-pay | Admitting: Pharmacist

## 2021-01-11 ENCOUNTER — Other Ambulatory Visit: Payer: Self-pay

## 2021-01-11 VITALS — BP 118/84 | HR 74 | Temp 98.3°F | Resp 16 | Ht 68.0 in | Wt 279.4 lb

## 2021-01-11 DIAGNOSIS — G4733 Obstructive sleep apnea (adult) (pediatric): Secondary | ICD-10-CM

## 2021-01-11 DIAGNOSIS — E1122 Type 2 diabetes mellitus with diabetic chronic kidney disease: Secondary | ICD-10-CM | POA: Diagnosis not present

## 2021-01-11 DIAGNOSIS — E782 Mixed hyperlipidemia: Secondary | ICD-10-CM

## 2021-01-11 DIAGNOSIS — Z9989 Dependence on other enabling machines and devices: Secondary | ICD-10-CM | POA: Diagnosis not present

## 2021-01-11 DIAGNOSIS — I10 Essential (primary) hypertension: Secondary | ICD-10-CM | POA: Diagnosis not present

## 2021-01-11 DIAGNOSIS — F17200 Nicotine dependence, unspecified, uncomplicated: Secondary | ICD-10-CM

## 2021-01-11 DIAGNOSIS — E1165 Type 2 diabetes mellitus with hyperglycemia: Secondary | ICD-10-CM | POA: Diagnosis not present

## 2021-01-11 DIAGNOSIS — Z6841 Body Mass Index (BMI) 40.0 and over, adult: Secondary | ICD-10-CM

## 2021-01-11 DIAGNOSIS — R0989 Other specified symptoms and signs involving the circulatory and respiratory systems: Secondary | ICD-10-CM | POA: Diagnosis not present

## 2021-01-11 DIAGNOSIS — N184 Chronic kidney disease, stage 4 (severe): Secondary | ICD-10-CM | POA: Diagnosis not present

## 2021-01-11 NOTE — Progress Notes (Signed)
[name]. Date of Birth: November 01, 1951 MRN: [id number] Primary Care Provider:Collins, Hinton Dyer, DO Former Cardiology Providers: Dr. Adrian Prows Primary Cardiologist: Rex Kras, DO, [facility] (established care 07/07/2020)  Date: 01/11/21 Last Office Visit: 09/08/2020  Chief Complaint  Patient presents with  . Hypertension    HPI  [name]. is a 70 y.o.  male who presents to the office with a chief complaint of " 50-monthfollow-up for high blood pressure management." Patient's past medical history and cardiovascular risk factors include: Diabetes mellitus type 2, GERD, hyperlipidemia, hypertension, nonsecretory pituitary macroadenoma, rheumatoid arthritis, bronchitis, active tobacco use, advanced age, obesity due to excess calories.  Patient is referred to the office at the request of his primary care provider Dr. CTheda Sersfor evaluation and management of hypertension.  Patient was formally under the care of Dr. JAdrian Prowsand reestablish care with myself as of 07/07/2020.   Prior to establishing care with myself patient systolic blood pressures are very uncontrolled.  He used to have blood pressure readings as high as 198/87.  His medications have been uptitrated and his blood pressure at today's office visit 118/84.  Patient has also been enrolled into ambulatory blood pressure monitoring and his blood pressures have been relatively stable since last office visit.    Since last office visit patient states that he did undergo renal biopsy and based on the records received he has a component of diabetic nephropathy as well as FSGS.  He is currently under the care of of a nephrologist.  Last office note reviewed.  His hydralazine has been reduced to 50 mg p.o. 3 times daily, which I agree with.  His nephrologist has also started ARB given his proteinuria and plans to start spironolactone.  From a cardiovascular standpoint initiation of ARB and spironolactone is very acceptable.   FUNCTIONAL  STATUS: No structured exercise program or daily routine.  ALLERGIES: Allergies  Allergen Reactions  . Atorvastatin Other (See Comments)    unknown  . FWilder Glade[Dapagliflozin] Other (See Comments)    Yeast infection   . Folic Acid Hives  . Humira [Adalimumab] Other (See Comments)    Caused a stroke   . Metformin And Related Other (See Comments)    Weak if takes 1000 mg  . Plavix [Clopidogrel Bisulfate] Hives  . Simvastatin Other (See Comments)    Couldn't think straight     MEDICATION LIST PRIOR TO VISIT: Current Outpatient Medications on File Prior to Visit  Medication Sig Dispense Refill  . acetaminophen (TYLENOL) 650 MG CR tablet Take 1,300 mg by mouth every 8 (eight) hours as needed for pain.    .Marland Kitchenalbuterol (PROVENTIL HFA;VENTOLIN HFA) 108 (90 Base) MCG/ACT inhaler Inhale 1-2 puffs into the lungs every 6 (six) hours as needed for wheezing or shortness of breath.    .Marland Kitchenaspirin EC 81 MG tablet Take 81 mg by mouth daily.     . diclofenac Sodium (VOLTAREN) 1 % GEL Apply 2 g topically 4 (four) times daily as needed (pain).     .Marland Kitchendoxazosin (CARDURA) 2 MG tablet Take 2 mg by mouth 2 (two) times daily.     .Marland KitchenEPINEPHrine 0.3 mg/0.3 mL IJ SOAJ injection Inject 0.3 mLs (0.3 mg total) into the muscle as needed for up to 2 doses. (Patient taking differently: Inject 0.3 mg into the muscle as needed for anaphylaxis.) 2 Device 0  . escitalopram (LEXAPRO) 10 MG tablet Take 10 mg by mouth daily.    . furosemide (LASIX)  80 MG tablet Take 80 mg by mouth 2 (two) times daily.     . hydrALAZINE (APRESOLINE) 100 MG tablet TAKE 1 TABLET (100 MG TOTAL) BY MOUTH 3 (THREE) TIMES DAILY. (Patient taking differently: Take 50 mg by mouth 3 (three) times daily. Take 1/2 tablet 3 times daily) 270 tablet 0  . isosorbide dinitrate (ISORDIL) 30 MG tablet TAKE 2 TABLETS (60 MG TOTAL) BY MOUTH 3 (THREE) TIMES DAILY. 180 tablet 2  . labetalol (NORMODYNE) 200 MG tablet TAKE 1 TABLET BY MOUTH TWICE A DAY 180 tablet 0  .  levothyroxine (SYNTHROID, LEVOTHROID) 200 MCG tablet Take 200 mcg by mouth daily before breakfast.     . NIFEdipine (PROCARDIA XL/NIFEDICAL XL) 60 MG 24 hr tablet TAKE 1 TABLET BY MOUTH EVERY DAY 90 tablet 1  . ONETOUCH VERIO test strip 3 (three) times daily.    . pravastatin (PRAVACHOL) 40 MG tablet Take 40 mg by mouth daily.     . predniSONE (DELTASONE) 5 MG tablet Take 5 mg by mouth daily as needed (arthritis).     Marland Kitchen telmisartan (MICARDIS) 20 MG tablet Take 20 mg by mouth daily.     . TRADJENTA 5 MG TABS tablet Take 5 mg by mouth daily.     No current facility-administered medications on file prior to visit.    PAST MEDICAL HISTORY: Past Medical History:  Diagnosis Date  . Asthma   . Chronic kidney disease   . Diabetes mellitus without complication ([place])   . Hyperlipidemia   . Hypertension   . Hypothyroidism   . Obesity   . Pituitary macroadenoma ([place])   . Rheumatoid arthritis ([place])   . Sleep apnea     PAST SURGICAL HISTORY: Past Surgical History:  Procedure Laterality Date  . Arthroscopic knee surgery Right 1998  . Gamma knife surgery  2011  . HAND SURGERY    . THYROIDECTOMY    . TONSILLECTOMY    . TUMOR REMOVAL     Pituitary    FAMILY HISTORY: The patient's family history includes Emphysema in his father; Healthy in his brother, sister, and sister; Hypertension in his mother.   SOCIAL HISTORY:  The patient  reports that he has been smoking cigarettes. He has a 40.00 pack-year smoking history. He has never used smokeless tobacco. He reports current alcohol use. He reports previous drug use. Drug: Marijuana.  Review of Systems  Constitutional: Negative for chills and fever.  HENT: Negative for hoarse voice and nosebleeds.   Eyes: Negative for discharge, double vision and pain.  Cardiovascular: Positive for dyspnea on exertion (improved. ) and leg swelling (improving). Negative for chest pain, claudication, near-syncope, orthopnea, palpitations, paroxysmal nocturnal  dyspnea and syncope.  Respiratory: Negative for hemoptysis and shortness of breath.   Musculoskeletal: Negative for muscle cramps and myalgias.  Gastrointestinal: Negative for abdominal pain, constipation, diarrhea, hematemesis, hematochezia, melena, nausea and vomiting.  Neurological: Negative for dizziness and light-headedness.   PHYSICAL EXAM: Vitals with BMI 01/11/2021 09/13/2020 09/13/2020  Height '5\' 8"'  - -  Weight 279 lbs 6 oz - -  BMI 46.27 - -  Systolic 035 009 381  Diastolic 84 55 829  Pulse 74 53 51   CONSTITUTIONAL: Well-developed and well-nourished. No acute distress.  SKIN: Skin is warm and dry. No rash noted. No cyanosis. No pallor. No jaundice HEAD: Normocephalic and atraumatic.  EYES: No scleral icterus MOUTH/THROAT: Moist oral membranes.  NECK: No JVD present. No thyromegaly noted. Right carotid bruit LYMPHATIC: No visible cervical adenopathy.  CHEST Normal respiratory effort. No intercostal retractions  LUNGS: Decreased breath sounds bilaterally.  No stridor. No wheezes. No rales.  CARDIOVASCULAR: Regular positives S1-S2, no gallops rubs or murmurs appreciated ABDOMINAL: Obese soft, nontender, nondistended, positive bowel sounds all 4 quadrants, no apparent ascites.  EXTREMITIES: trace bilateral pitting peripheral edema  HEMATOLOGIC: No significant bruising NEUROLOGIC: Oriented to person, place, and time. Nonfocal. Normal muscle tone.  PSYCHIATRIC: Normal mood and affect. Normal behavior. Cooperative  CARDIAC DATABASE: EKG: 01/11/2021: Normal sinus rhythm, 65 bpm, normal axis, without underlying ischemia or injury pattern.  Echocardiogram: 08/01/2020:  Left ventricle cavity is normal in size. Mild concentric hypertrophy of the left ventricle. Normal global Plagge motion. Normal LV systolic function with visual EF 55-60%. Doppler evidence of grade II (pseudonormal)  diastolic dysfunction, elevated LAP.  Left atrial cavity is moderately dilated.  Mild (Grade I) mitral  regurgitation.  Mild tricuspid regurgitation. Estimated pulmonary artery systolic pressure  34 mmHg.   Stress Testing:  January 2014: Myocardial perfusion imaging: Perfusion images reveal a moderate sized inferior and apical nontransmural scar without superimposed ischemia.  LVEF 61%.  Low risk study clinical correlation recommended in a patient who weighs 260 pounds and a BMI of 46.  Heart Catheterization: None  Carotid duplex: 06/22/2018: Minimal stenosis of the left external carotid artery (less than 50%) bilateral antegrade vertebral artery flow.  Lower extremity arterial duplex: 06/22/2018: Biphasic waveform in the right proximal SFA and below suggest diffuse disease no hemodynamically significant stenosis identified in the left lower extremity.  ABI mildly reduced in the right lower extremity.  Normal ABI suggestive of normal perfusion in the left lower extremity.  LABORATORY DATA: External Labs: Collected: 07/04/2020 Creatinine 2.9 mg/dL. eGFR: 23 mL/min per 1.73 m Sodium 146, BUN 36, BUN to creatinine ratio 12.2 TSH: 0.59   Lipid profile: Collected: 05/03/2020 Total cholesterol 181, triglycerides 285, HDL 57, LDL 67, non-HDL 124  CBC Latest Ref Rng & Units 09/13/2020 08/17/2020 08/16/2020  WBC 4.0 - 10.5 K/uL 5.9 11.9(H) 10.1  Hemoglobin 13.0 - 17.0 g/dL 10.7(L) 10.0(L) 10.7(L)  Hematocrit 39.0 - 52.0 % 33.9(L) 31.0(L) 33.7(L)  Platelets 150 - 400 K/uL 213 200 215   LABORATORY DATA: CBC Latest Ref Rng & Units 09/13/2020 08/17/2020 08/16/2020  WBC 4.0 - 10.5 K/uL 5.9 11.9(H) 10.1  Hemoglobin 13.0 - 17.0 g/dL 10.7(L) 10.0(L) 10.7(L)  Hematocrit 39.0 - 52.0 % 33.9(L) 31.0(L) 33.7(L)  Platelets 150 - 400 K/uL 213 200 215    CMP Latest Ref Rng & Units 08/17/2020 08/16/2020 08/15/2020  Glucose 70 - 99 mg/dL 225(H) 406(H) 78  BUN 8 - 23 mg/dL 57(H) 37(H) 29(H)  Creatinine 0.61 - 1.24 mg/dL 3.29(H) 3.31(H) 2.79(H)  Sodium 135 - 145 mmol/L 137 136 143  Potassium 3.5 - 5.1 mmol/L  3.9 4.1 3.3(L)  Chloride 98 - 111 mmol/L 103 100 108  CO2 22 - 32 mmol/L '23 23 26  ' Calcium 8.9 - 10.3 mg/dL 8.9 8.7(L) 8.9  Total Protein 6.5 - 8.1 g/dL - - 6.1(L)  Total Bilirubin 0.3 - 1.2 mg/dL - - 0.4  Alkaline Phos 38 - 126 U/L - - 55  AST 15 - 41 U/L - - 24  ALT 0 - 44 U/L - - 16    Lipid Panel  No results found for: CHOL, TRIG, HDL, CHOLHDL, VLDL, LDLCALC, LDLDIRECT, LABVLDL  No components found for: NTPROBNP Recent Labs    08/01/20 1039  PROBNP 1,100*   Recent Labs    07/11/20 1851  TSH 0.616  HEMOGLOBIN A1C Lab Results  Component Value Date   HGBA1C 6.6 (H) 07/11/2020   MPG 142.72 07/11/2020   IMPRESSION:    ICD-10-CM   1. Benign hypertension  I10 EKG 12-Lead  2. Type 2 diabetes mellitus with hyperglycemia, without long-term current use of insulin (HCC)  E11.65   3. Type 2 diabetes mellitus with stage 4 chronic kidney disease, without long-term current use of insulin (HCC)  E11.22    N18.4   4. Mixed hyperlipidemia  E78.2   5. Smoking  F17.200   6. Class 3 severe obesity due to excess calories with serious comorbidity and body mass index (BMI) of 40.0 to 44.9 in adult (HCC)  E66.01    Z68.41   7. OSA on CPAP  G47.33    Z99.89   8. Bruit of right carotid artery  R09.89 PCV CAROTID DUPLEX (BILATERAL)     RECOMMENDATIONS: [name]. is a 70 y.o. male whose past medical history and cardiovascular risk factors include: Diabetes mellitus type 2, GERD, hyperlipidemia, hypertension, nonsecretory pituitary macroadenoma, rheumatoid arthritis, bronchitis, active tobacco use, advanced age, obesity due to excess calories.  Benign essential hypertension:  Home medications reconciled.  Continue hydralazine 6m p.o. 3 times daily, agree w/ the change made by his nephrologist.   Continue Isordil to 60 mg p.o. 3 times daily  Continue labetalol 200 mg p.o. twice daily.  Continue Procardia XL 60 mg p.o. daily.   Initiated on telmisartan 20 mg p.o. daily,  per nephrology recommendations.  Agree.  Based on the last nephrology office note obtained I reviewed during today's encounter there are plans to also initiate spironolactone.  From a cardiovascular standpoint that should be fine.  Re-encouraged the importance of a low-salt diet  Patient is enrolled into principal care management for ambulatory blood pressure monitoring.  Office blood pressures are well controlled.   Patient was referred to me for management of high blood pressure.  At that time he did not have a nephrologist however given his underlying progressive CKD most likely secondary to diabetic nephropathy as well as FSGS he is now under the care of a nephrologist.  I explained to the patient that he will be very appropriate if his nephrologist wants to take over the management of his hypertension as blood pressure and kidney function are intricately related.  Patient states that he will discuss it further with his nephrologist.    Right carotid bruit: Patient will be scheduled for a carotid duplex.  Hyperlipidemia: Continue statin therapy.  Managed per primary team  Tobacco use: Educated on importance of complete smoking cessation.  Patient does not appear to be motivated to stop smoking.   FINAL MEDICATION LIST END OF ENCOUNTER:   Current Outpatient Medications:  .  acetaminophen (TYLENOL) 650 MG CR tablet, Take 1,300 mg by mouth every 8 (eight) hours as needed for pain., Disp: , Rfl:  .  albuterol (PROVENTIL HFA;VENTOLIN HFA) 108 (90 Base) MCG/ACT inhaler, Inhale 1-2 puffs into the lungs every 6 (six) hours as needed for wheezing or shortness of breath., Disp: , Rfl:  .  aspirin EC 81 MG tablet, Take 81 mg by mouth daily. , Disp: , Rfl:  .  diclofenac Sodium (VOLTAREN) 1 % GEL, Apply 2 g topically 4 (four) times daily as needed (pain). , Disp: , Rfl:  .  doxazosin (CARDURA) 2 MG tablet, Take 2 mg by mouth 2 (two) times daily. , Disp: , Rfl:  .  EPINEPHrine 0.3 mg/0.3 mL IJ SOAJ  injection, Inject 0.3 mLs (0.3 mg total) into the muscle as needed for up to 2 doses. (Patient taking differently: Inject 0.3 mg into the muscle as needed for anaphylaxis.), Disp: 2 Device, Rfl: 0 .  escitalopram (LEXAPRO) 10 MG tablet, Take 10 mg by mouth daily., Disp: , Rfl:  .  furosemide (LASIX) 80 MG tablet, Take 80 mg by mouth 2 (two) times daily. , Disp: , Rfl:  .  hydrALAZINE (APRESOLINE) 100 MG tablet, TAKE 1 TABLET (100 MG TOTAL) BY MOUTH 3 (THREE) TIMES DAILY. (Patient taking differently: Take 50 mg by mouth 3 (three) times daily. Take 1/2 tablet 3 times daily), Disp: 270 tablet, Rfl: 0 .  isosorbide dinitrate (ISORDIL) 30 MG tablet, TAKE 2 TABLETS (60 MG TOTAL) BY MOUTH 3 (THREE) TIMES DAILY., Disp: 180 tablet, Rfl: 2 .  labetalol (NORMODYNE) 200 MG tablet, TAKE 1 TABLET BY MOUTH TWICE A DAY, Disp: 180 tablet, Rfl: 0 .  leflunomide (ARAVA) 20 MG tablet, Take 20 mg by mouth daily., Disp: , Rfl:  .  levothyroxine (SYNTHROID, LEVOTHROID) 200 MCG tablet, Take 200 mcg by mouth daily before breakfast. , Disp: , Rfl:  .  NIFEdipine (PROCARDIA XL/NIFEDICAL XL) 60 MG 24 hr tablet, TAKE 1 TABLET BY MOUTH EVERY DAY, Disp: 90 tablet, Rfl: 1 .  ONETOUCH VERIO test strip, 3 (three) times daily., Disp: , Rfl:  .  pravastatin (PRAVACHOL) 40 MG tablet, Take 40 mg by mouth daily. , Disp: , Rfl:  .  predniSONE (DELTASONE) 5 MG tablet, Take 5 mg by mouth daily as needed (arthritis). , Disp: , Rfl:  .  telmisartan (MICARDIS) 20 MG tablet, Take 20 mg by mouth daily. , Disp: , Rfl:  .  TRADJENTA 5 MG TABS tablet, Take 5 mg by mouth daily., Disp: , Rfl:   Orders Placed This Encounter  Procedures  . EKG 12-Lead  . PCV CAROTID DUPLEX (BILATERAL)    --Continue cardiac medications as reconciled in final medication list. --Return in about 6 months (around 07/11/2021) for Follow up, BP, carotid disease. Or sooner if needed. --Continue follow-up with your primary care physician regarding the management of your  other chronic comorbid conditions.  Patient's questions and concerns were addressed to his satisfaction. He voices understanding of the instructions provided during this encounter.   This note was created using a voice recognition software as a result there may be grammatical errors inadvertently enclosed that do not reflect the nature of this encounter. Every attempt is made to correct such errors.  Total time spent: 32 minutes.   Rex Kras, [place], [facility]  Pager: [phone] Office: [phone]

## 2021-01-30 ENCOUNTER — Other Ambulatory Visit: Payer: Self-pay | Admitting: Cardiology

## 2021-01-30 DIAGNOSIS — I10 Essential (primary) hypertension: Secondary | ICD-10-CM

## 2021-01-31 DIAGNOSIS — D443 Neoplasm of uncertain behavior of pituitary gland: Secondary | ICD-10-CM | POA: Diagnosis not present

## 2021-01-31 DIAGNOSIS — E039 Hypothyroidism, unspecified: Secondary | ICD-10-CM | POA: Diagnosis not present

## 2021-01-31 DIAGNOSIS — N184 Chronic kidney disease, stage 4 (severe): Secondary | ICD-10-CM | POA: Diagnosis not present

## 2021-01-31 DIAGNOSIS — Z125 Encounter for screening for malignant neoplasm of prostate: Secondary | ICD-10-CM | POA: Diagnosis not present

## 2021-01-31 DIAGNOSIS — I10 Essential (primary) hypertension: Secondary | ICD-10-CM | POA: Diagnosis not present

## 2021-01-31 DIAGNOSIS — E7800 Pure hypercholesterolemia, unspecified: Secondary | ICD-10-CM | POA: Diagnosis not present

## 2021-01-31 DIAGNOSIS — E118 Type 2 diabetes mellitus with unspecified complications: Secondary | ICD-10-CM | POA: Diagnosis not present

## 2021-02-02 DIAGNOSIS — I10 Essential (primary) hypertension: Secondary | ICD-10-CM | POA: Diagnosis not present

## 2021-02-04 ENCOUNTER — Other Ambulatory Visit: Payer: Self-pay | Admitting: Cardiology

## 2021-02-07 DIAGNOSIS — L814 Other melanin hyperpigmentation: Secondary | ICD-10-CM | POA: Diagnosis not present

## 2021-02-07 DIAGNOSIS — L820 Inflamed seborrheic keratosis: Secondary | ICD-10-CM | POA: Diagnosis not present

## 2021-02-07 DIAGNOSIS — L821 Other seborrheic keratosis: Secondary | ICD-10-CM | POA: Diagnosis not present

## 2021-02-07 DIAGNOSIS — Z85828 Personal history of other malignant neoplasm of skin: Secondary | ICD-10-CM | POA: Diagnosis not present

## 2021-02-07 DIAGNOSIS — L853 Xerosis cutis: Secondary | ICD-10-CM | POA: Diagnosis not present

## 2021-02-09 DIAGNOSIS — E890 Postprocedural hypothyroidism: Secondary | ICD-10-CM | POA: Diagnosis not present

## 2021-02-09 DIAGNOSIS — E1122 Type 2 diabetes mellitus with diabetic chronic kidney disease: Secondary | ICD-10-CM | POA: Diagnosis not present

## 2021-02-09 DIAGNOSIS — J449 Chronic obstructive pulmonary disease, unspecified: Secondary | ICD-10-CM | POA: Diagnosis not present

## 2021-02-09 DIAGNOSIS — Z0000 Encounter for general adult medical examination without abnormal findings: Secondary | ICD-10-CM | POA: Diagnosis not present

## 2021-02-09 DIAGNOSIS — F32A Depression, unspecified: Secondary | ICD-10-CM | POA: Diagnosis not present

## 2021-02-09 DIAGNOSIS — E7800 Pure hypercholesterolemia, unspecified: Secondary | ICD-10-CM | POA: Diagnosis not present

## 2021-02-09 DIAGNOSIS — S46911A Strain of unspecified muscle, fascia and tendon at shoulder and upper arm level, right arm, initial encounter: Secondary | ICD-10-CM | POA: Diagnosis not present

## 2021-02-09 DIAGNOSIS — F17200 Nicotine dependence, unspecified, uncomplicated: Secondary | ICD-10-CM | POA: Diagnosis not present

## 2021-02-09 DIAGNOSIS — D631 Anemia in chronic kidney disease: Secondary | ICD-10-CM | POA: Diagnosis not present

## 2021-02-09 DIAGNOSIS — N184 Chronic kidney disease, stage 4 (severe): Secondary | ICD-10-CM | POA: Diagnosis not present

## 2021-02-09 DIAGNOSIS — I10 Essential (primary) hypertension: Secondary | ICD-10-CM | POA: Diagnosis not present

## 2021-02-28 ENCOUNTER — Other Ambulatory Visit: Payer: Self-pay | Admitting: Cardiology

## 2021-02-28 DIAGNOSIS — I10 Essential (primary) hypertension: Secondary | ICD-10-CM

## 2021-03-04 DIAGNOSIS — I10 Essential (primary) hypertension: Secondary | ICD-10-CM | POA: Diagnosis not present

## 2021-03-11 ENCOUNTER — Other Ambulatory Visit: Payer: Self-pay | Admitting: Cardiology

## 2021-03-11 DIAGNOSIS — I10 Essential (primary) hypertension: Secondary | ICD-10-CM

## 2021-03-27 DIAGNOSIS — C73 Malignant neoplasm of thyroid gland: Secondary | ICD-10-CM | POA: Diagnosis not present

## 2021-03-27 DIAGNOSIS — E1122 Type 2 diabetes mellitus with diabetic chronic kidney disease: Secondary | ICD-10-CM | POA: Diagnosis not present

## 2021-03-27 DIAGNOSIS — N184 Chronic kidney disease, stage 4 (severe): Secondary | ICD-10-CM | POA: Diagnosis not present

## 2021-03-27 DIAGNOSIS — E7800 Pure hypercholesterolemia, unspecified: Secondary | ICD-10-CM | POA: Diagnosis not present

## 2021-03-27 DIAGNOSIS — E039 Hypothyroidism, unspecified: Secondary | ICD-10-CM | POA: Diagnosis not present

## 2021-03-27 DIAGNOSIS — E118 Type 2 diabetes mellitus with unspecified complications: Secondary | ICD-10-CM | POA: Diagnosis not present

## 2021-03-27 DIAGNOSIS — D631 Anemia in chronic kidney disease: Secondary | ICD-10-CM | POA: Diagnosis not present

## 2021-03-27 DIAGNOSIS — D443 Neoplasm of uncertain behavior of pituitary gland: Secondary | ICD-10-CM | POA: Diagnosis not present

## 2021-03-30 DIAGNOSIS — H10413 Chronic giant papillary conjunctivitis, bilateral: Secondary | ICD-10-CM | POA: Diagnosis not present

## 2021-03-30 DIAGNOSIS — C73 Malignant neoplasm of thyroid gland: Secondary | ICD-10-CM | POA: Diagnosis not present

## 2021-03-30 DIAGNOSIS — G4730 Sleep apnea, unspecified: Secondary | ICD-10-CM | POA: Diagnosis not present

## 2021-03-30 DIAGNOSIS — E7800 Pure hypercholesterolemia, unspecified: Secondary | ICD-10-CM | POA: Diagnosis not present

## 2021-03-30 DIAGNOSIS — H40053 Ocular hypertension, bilateral: Secondary | ICD-10-CM | POA: Diagnosis not present

## 2021-03-30 DIAGNOSIS — H04123 Dry eye syndrome of bilateral lacrimal glands: Secondary | ICD-10-CM | POA: Diagnosis not present

## 2021-03-30 DIAGNOSIS — I10 Essential (primary) hypertension: Secondary | ICD-10-CM | POA: Diagnosis not present

## 2021-03-30 DIAGNOSIS — E1122 Type 2 diabetes mellitus with diabetic chronic kidney disease: Secondary | ICD-10-CM | POA: Diagnosis not present

## 2021-03-30 DIAGNOSIS — N184 Chronic kidney disease, stage 4 (severe): Secondary | ICD-10-CM | POA: Diagnosis not present

## 2021-03-30 DIAGNOSIS — H11823 Conjunctivochalasis, bilateral: Secondary | ICD-10-CM | POA: Diagnosis not present

## 2021-03-30 DIAGNOSIS — H2513 Age-related nuclear cataract, bilateral: Secondary | ICD-10-CM | POA: Diagnosis not present

## 2021-03-30 DIAGNOSIS — D443 Neoplasm of uncertain behavior of pituitary gland: Secondary | ICD-10-CM | POA: Diagnosis not present

## 2021-03-30 DIAGNOSIS — E119 Type 2 diabetes mellitus without complications: Secondary | ICD-10-CM | POA: Diagnosis not present

## 2021-03-30 DIAGNOSIS — D631 Anemia in chronic kidney disease: Secondary | ICD-10-CM | POA: Diagnosis not present

## 2021-03-30 DIAGNOSIS — E890 Postprocedural hypothyroidism: Secondary | ICD-10-CM | POA: Diagnosis not present

## 2021-03-31 ENCOUNTER — Other Ambulatory Visit: Payer: Self-pay | Admitting: Cardiology

## 2021-03-31 DIAGNOSIS — E1122 Type 2 diabetes mellitus with diabetic chronic kidney disease: Secondary | ICD-10-CM | POA: Diagnosis not present

## 2021-03-31 DIAGNOSIS — I129 Hypertensive chronic kidney disease with stage 1 through stage 4 chronic kidney disease, or unspecified chronic kidney disease: Secondary | ICD-10-CM | POA: Diagnosis not present

## 2021-03-31 DIAGNOSIS — D631 Anemia in chronic kidney disease: Secondary | ICD-10-CM | POA: Diagnosis not present

## 2021-03-31 DIAGNOSIS — N2581 Secondary hyperparathyroidism of renal origin: Secondary | ICD-10-CM | POA: Diagnosis not present

## 2021-03-31 DIAGNOSIS — N400 Enlarged prostate without lower urinary tract symptoms: Secondary | ICD-10-CM | POA: Diagnosis not present

## 2021-03-31 DIAGNOSIS — N184 Chronic kidney disease, stage 4 (severe): Secondary | ICD-10-CM | POA: Diagnosis not present

## 2021-04-03 DIAGNOSIS — I10 Essential (primary) hypertension: Secondary | ICD-10-CM | POA: Diagnosis not present

## 2021-04-07 DIAGNOSIS — J329 Chronic sinusitis, unspecified: Secondary | ICD-10-CM | POA: Diagnosis not present

## 2021-04-07 DIAGNOSIS — R059 Cough, unspecified: Secondary | ICD-10-CM | POA: Diagnosis not present

## 2021-04-10 DIAGNOSIS — E7800 Pure hypercholesterolemia, unspecified: Secondary | ICD-10-CM | POA: Diagnosis not present

## 2021-04-10 DIAGNOSIS — M79604 Pain in right leg: Secondary | ICD-10-CM | POA: Diagnosis not present

## 2021-04-10 DIAGNOSIS — I10 Essential (primary) hypertension: Secondary | ICD-10-CM | POA: Diagnosis not present

## 2021-04-10 DIAGNOSIS — E790 Hyperuricemia without signs of inflammatory arthritis and tophaceous disease: Secondary | ICD-10-CM | POA: Diagnosis not present

## 2021-04-10 DIAGNOSIS — D631 Anemia in chronic kidney disease: Secondary | ICD-10-CM | POA: Diagnosis not present

## 2021-04-10 DIAGNOSIS — M7989 Other specified soft tissue disorders: Secondary | ICD-10-CM | POA: Diagnosis not present

## 2021-04-10 DIAGNOSIS — E119 Type 2 diabetes mellitus without complications: Secondary | ICD-10-CM | POA: Diagnosis not present

## 2021-04-10 DIAGNOSIS — Z79899 Other long term (current) drug therapy: Secondary | ICD-10-CM | POA: Diagnosis not present

## 2021-04-10 DIAGNOSIS — E039 Hypothyroidism, unspecified: Secondary | ICD-10-CM | POA: Diagnosis not present

## 2021-04-10 DIAGNOSIS — E1122 Type 2 diabetes mellitus with diabetic chronic kidney disease: Secondary | ICD-10-CM | POA: Diagnosis not present

## 2021-04-10 DIAGNOSIS — M0589 Other rheumatoid arthritis with rheumatoid factor of multiple sites: Secondary | ICD-10-CM | POA: Diagnosis not present

## 2021-04-10 DIAGNOSIS — M1990 Unspecified osteoarthritis, unspecified site: Secondary | ICD-10-CM | POA: Diagnosis not present

## 2021-04-10 DIAGNOSIS — M179 Osteoarthritis of knee, unspecified: Secondary | ICD-10-CM | POA: Diagnosis not present

## 2021-04-14 DIAGNOSIS — N184 Chronic kidney disease, stage 4 (severe): Secondary | ICD-10-CM | POA: Diagnosis not present

## 2021-05-03 DIAGNOSIS — I10 Essential (primary) hypertension: Secondary | ICD-10-CM | POA: Diagnosis not present

## 2021-05-28 ENCOUNTER — Other Ambulatory Visit: Payer: Self-pay | Admitting: Cardiology

## 2021-05-28 DIAGNOSIS — I10 Essential (primary) hypertension: Secondary | ICD-10-CM

## 2021-05-30 ENCOUNTER — Other Ambulatory Visit: Payer: Self-pay | Admitting: Cardiology

## 2021-05-30 DIAGNOSIS — I10 Essential (primary) hypertension: Secondary | ICD-10-CM

## 2021-06-01 DIAGNOSIS — I10 Essential (primary) hypertension: Secondary | ICD-10-CM | POA: Diagnosis not present

## 2021-06-20 DIAGNOSIS — N184 Chronic kidney disease, stage 4 (severe): Secondary | ICD-10-CM | POA: Diagnosis not present

## 2021-06-29 DIAGNOSIS — N2581 Secondary hyperparathyroidism of renal origin: Secondary | ICD-10-CM | POA: Diagnosis not present

## 2021-06-29 DIAGNOSIS — E039 Hypothyroidism, unspecified: Secondary | ICD-10-CM | POA: Diagnosis not present

## 2021-06-29 DIAGNOSIS — I129 Hypertensive chronic kidney disease with stage 1 through stage 4 chronic kidney disease, or unspecified chronic kidney disease: Secondary | ICD-10-CM | POA: Diagnosis not present

## 2021-06-29 DIAGNOSIS — N184 Chronic kidney disease, stage 4 (severe): Secondary | ICD-10-CM | POA: Diagnosis not present

## 2021-06-29 DIAGNOSIS — E1122 Type 2 diabetes mellitus with diabetic chronic kidney disease: Secondary | ICD-10-CM | POA: Diagnosis not present

## 2021-06-29 DIAGNOSIS — D631 Anemia in chronic kidney disease: Secondary | ICD-10-CM | POA: Diagnosis not present

## 2021-06-29 DIAGNOSIS — E785 Hyperlipidemia, unspecified: Secondary | ICD-10-CM | POA: Diagnosis not present

## 2021-07-03 ENCOUNTER — Other Ambulatory Visit: Payer: Self-pay

## 2021-07-03 ENCOUNTER — Ambulatory Visit: Payer: Medicare Other

## 2021-07-05 ENCOUNTER — Other Ambulatory Visit: Payer: Self-pay

## 2021-07-05 DIAGNOSIS — I10 Essential (primary) hypertension: Secondary | ICD-10-CM

## 2021-07-05 DIAGNOSIS — M7989 Other specified soft tissue disorders: Secondary | ICD-10-CM

## 2021-07-06 ENCOUNTER — Ambulatory Visit: Payer: Medicare Other

## 2021-07-06 ENCOUNTER — Other Ambulatory Visit: Payer: Self-pay

## 2021-07-06 DIAGNOSIS — R0989 Other specified symptoms and signs involving the circulatory and respiratory systems: Secondary | ICD-10-CM | POA: Diagnosis not present

## 2021-07-06 DIAGNOSIS — M7989 Other specified soft tissue disorders: Secondary | ICD-10-CM

## 2021-07-06 DIAGNOSIS — I10 Essential (primary) hypertension: Secondary | ICD-10-CM

## 2021-07-07 DIAGNOSIS — I10 Essential (primary) hypertension: Secondary | ICD-10-CM | POA: Diagnosis not present

## 2021-07-11 ENCOUNTER — Other Ambulatory Visit: Payer: Self-pay

## 2021-07-11 ENCOUNTER — Ambulatory Visit: Payer: Medicare Other | Admitting: Cardiology

## 2021-07-11 ENCOUNTER — Encounter: Payer: Self-pay | Admitting: Cardiology

## 2021-07-11 VITALS — BP 129/57 | HR 61 | Temp 98.0°F | Resp 16 | Ht 68.0 in | Wt 263.4 lb

## 2021-07-11 DIAGNOSIS — G4733 Obstructive sleep apnea (adult) (pediatric): Secondary | ICD-10-CM | POA: Diagnosis not present

## 2021-07-11 DIAGNOSIS — E66813 Obesity, class 3: Secondary | ICD-10-CM

## 2021-07-11 DIAGNOSIS — I6523 Occlusion and stenosis of bilateral carotid arteries: Secondary | ICD-10-CM

## 2021-07-11 DIAGNOSIS — I10 Essential (primary) hypertension: Secondary | ICD-10-CM

## 2021-07-11 DIAGNOSIS — E1122 Type 2 diabetes mellitus with diabetic chronic kidney disease: Secondary | ICD-10-CM | POA: Diagnosis not present

## 2021-07-11 DIAGNOSIS — Z6841 Body Mass Index (BMI) 40.0 and over, adult: Secondary | ICD-10-CM

## 2021-07-11 DIAGNOSIS — F17200 Nicotine dependence, unspecified, uncomplicated: Secondary | ICD-10-CM

## 2021-07-11 DIAGNOSIS — Z9989 Dependence on other enabling machines and devices: Secondary | ICD-10-CM | POA: Diagnosis not present

## 2021-07-11 DIAGNOSIS — R0989 Other specified symptoms and signs involving the circulatory and respiratory systems: Secondary | ICD-10-CM

## 2021-07-11 DIAGNOSIS — E1165 Type 2 diabetes mellitus with hyperglycemia: Secondary | ICD-10-CM | POA: Diagnosis not present

## 2021-07-11 DIAGNOSIS — N184 Chronic kidney disease, stage 4 (severe): Secondary | ICD-10-CM | POA: Diagnosis not present

## 2021-07-11 DIAGNOSIS — IMO0001 Reserved for inherently not codable concepts without codable children: Secondary | ICD-10-CM

## 2021-07-11 DIAGNOSIS — E782 Mixed hyperlipidemia: Secondary | ICD-10-CM | POA: Diagnosis not present

## 2021-07-11 NOTE — Progress Notes (Signed)
[name]. Date of Birth: 03-24-1951 MRN: [id number] Primary Care Provider:Collins, Hinton Dyer, DO Former Cardiology Providers: Dr. Adrian Prows Primary Cardiologist: Rex Kras, DO, [facility] (established care 07/07/2020) Primary nephrologist: Dr. Santiago Bumpers   Date: 07/11/21 Last Office Visit: 01/11/2021  Chief Complaint  Patient presents with   Benign hypertension   carotid disease   Follow-up    HPI  Dwayne Romulus. is a 70 y.o.  male who presents to the office with a chief complaint of " 66monthfollow-up for high blood pressure and carotid disease management." Patient's past medical history and cardiovascular risk factors include: Diabetes mellitus type 2, FSGS, carotid artery stenosis, GERD, hyperlipidemia, hypertension, nonsecretory pituitary macroadenoma, rheumatoid arthritis, bronchitis, active tobacco use, advanced age, obesity due to excess calories.  Referred to the office back in August 2021 for evaluation and management of hypertension.  Prior to establishing care his BP would be around 198/87 mmHg.  His medications have been uptitrated in a stepwise fashion and his blood pressure has improved significantly.  Given his progressive chronic kidney disease patient has established care with nephrology who is also recommended medication changes which have resulted in great results.  Unfortunately, patient's underlying kidney disease is due to diabetic nephropathy as well as FSGS.  He recently saw his nephrologist in July 2022.  He is been started on spironolactone and hydralazine has been discontinued.  He has relatively stable creatinine 3.7 mg/dL and potassium levels are less than 5.  Patient states that he is reluctant to be on dialysis but is currently working with his nephrology to understand and educate himself regarding his medical condition.  Clinically patient denies any chest pain or shortness of breath at rest or with effort related activities.  He has lost  approximately 16 pounds since last office visit.  At times he does complain of lightheaded and dizziness after taking his medications.  He informs me that he takes majority of his blood pressure pills in the morning.  Of note, he was noted to have bilateral carotid bruits on physical examination and underwent carotid duplex.  He is noted to have bilateral carotid artery stenosis although asymptomatic.  Results noted below for further reference.  Recommended the importance of aspirin and statin therapy.  And unfortunately he continues to smoke 1 pack/day.  He is very adamant that he will continue to smoke despite his other cardiovascular risk factors and conditions.   FUNCTIONAL STATUS: No structured exercise program or daily routine.  ALLERGIES: Allergies  Allergen Reactions   Atorvastatin Other (See Comments)    unknown   FIran[Dapagliflozin] Other (See Comments)    Yeast infection    Folic Acid Hives   Humira [Adalimumab] Other (See Comments)    Caused a stroke    Metformin And Related Other (See Comments)    Weak if takes 1000 mg   Plavix [Clopidogrel Bisulfate] Hives   Simvastatin Other (See Comments)    Couldn't think straight     MEDICATION LIST PRIOR TO VISIT: Current Outpatient Medications on File Prior to Visit  Medication Sig Dispense Refill   acetaminophen (TYLENOL) 650 MG CR tablet Take 1,300 mg by mouth every 8 (eight) hours as needed for pain.     albuterol (PROVENTIL HFA;VENTOLIN HFA) 108 (90 Base) MCG/ACT inhaler Inhale 1-2 puffs into the lungs every 6 (six) hours as needed for wheezing or shortness of breath.     aspirin EC 81 MG tablet Take 81 mg by mouth daily.  cyclobenzaprine (FLEXERIL) 10 MG tablet Take 10 mg by mouth at bedtime.     diclofenac Sodium (VOLTAREN) 1 % GEL Apply 2 g topically 4 (four) times daily as needed (pain).      doxazosin (CARDURA) 2 MG tablet Take 2 mg by mouth 2 (two) times daily.      EPINEPHrine 0.3 mg/0.3 mL IJ SOAJ injection  Inject 0.3 mLs (0.3 mg total) into the muscle as needed for up to 2 doses. (Patient taking differently: Inject 0.3 mg into the muscle as needed for anaphylaxis.) 2 Device 0   escitalopram (LEXAPRO) 10 MG tablet Take 10 mg by mouth daily.     ferrous sulfate 325 (65 FE) MG tablet Take 325 mg by mouth daily.     furosemide (LASIX) 80 MG tablet Take 80 mg by mouth daily.     isosorbide dinitrate (ISORDIL) 30 MG tablet TAKE 2 TABLETS (60 MG TOTAL) BY MOUTH 3 (THREE) TIMES DAILY. 180 tablet 2   labetalol (NORMODYNE) 200 MG tablet TAKE 1 TABLET BY MOUTH TWICE A DAY 180 tablet 0   leflunomide (ARAVA) 20 MG tablet Take 20 mg by mouth daily.     levothyroxine (SYNTHROID, LEVOTHROID) 200 MCG tablet Take 200 mcg by mouth daily before breakfast.      NIFEdipine (PROCARDIA XL/NIFEDICAL XL) 60 MG 24 hr tablet TAKE 1 TABLET BY MOUTH EVERY DAY 90 tablet 1   ONETOUCH VERIO test strip 3 (three) times daily.     pravastatin (PRAVACHOL) 40 MG tablet Take 40 mg by mouth daily.      predniSONE (DELTASONE) 5 MG tablet Take 5 mg by mouth daily as needed (arthritis).      spironolactone (ALDACTONE) 25 MG tablet Take 25 mg by mouth daily.     telmisartan (MICARDIS) 20 MG tablet Take 20 mg by mouth daily.      TRADJENTA 5 MG TABS tablet Take 5 mg by mouth daily.     No current facility-administered medications on file prior to visit.    PAST MEDICAL HISTORY: Past Medical History:  Diagnosis Date   Asthma    Chronic kidney disease    Diabetes mellitus without complication (HCC)    Hyperlipidemia    Hypertension    Hypothyroidism    Obesity    Pituitary macroadenoma (HCC)    Rheumatoid arthritis ([place])    Sleep apnea     PAST SURGICAL HISTORY: Past Surgical History:  Procedure Laterality Date   Arthroscopic knee surgery Right 1998   Gamma knife surgery  2011   HAND SURGERY     THYROIDECTOMY     TONSILLECTOMY     TUMOR REMOVAL     Pituitary    FAMILY HISTORY: The patient's family history includes  Emphysema in his father; Healthy in his brother, sister, and sister; Hypertension in his mother.   SOCIAL HISTORY:  The patient  reports that he has been smoking cigarettes. He has a 40.00 pack-year smoking history. He has never used smokeless tobacco. He reports current alcohol use. He reports previous drug use. Drug: Marijuana.  Review of Systems  Constitutional: Positive for weight loss. Negative for chills and fever.  HENT:  Negative for hoarse voice and nosebleeds.   Eyes:  Negative for discharge, double vision and pain.  Cardiovascular:  Positive for dyspnea on exertion (improved. ). Negative for chest pain, claudication, leg swelling, near-syncope, orthopnea, palpitations, paroxysmal nocturnal dyspnea and syncope.  Respiratory:  Negative for hemoptysis and shortness of breath.   Musculoskeletal:  Negative  for muscle cramps and myalgias.  Gastrointestinal:  Positive for dysphagia. Negative for abdominal pain, constipation, diarrhea, hematemesis, hematochezia, melena, nausea and vomiting.  Neurological:  Positive for dizziness. Negative for light-headedness.  PHYSICAL EXAM: Vitals with BMI 07/11/2021 01/11/2021 09/13/2020  Height _0  _1  -  Weight 263 lbs 6 oz 279 lbs 6 oz -  BMI 63.84 66.59 -  Systolic 935 701 779  Diastolic 57 84 55  Pulse 61 74 53   CONSTITUTIONAL: Well-developed and well-nourished. No acute distress.  SKIN: Skin is warm and dry. No rash noted. No cyanosis. No pallor. No jaundice HEAD: Normocephalic and atraumatic.  EYES: No scleral icterus MOUTH/THROAT: Moist oral membranes.  NECK: No JVD present. No thyromegaly noted. Bilateral carotid bruit LYMPHATIC: No visible cervical adenopathy.  CHEST Normal respiratory effort. No intercostal retractions  LUNGS: Decreased breath sounds bilaterally.  No stridor. No wheezes. No rales.  CARDIOVASCULAR: Regular positives S1-S2, no gallops rubs or murmurs appreciated ABDOMINAL: Obese soft, nontender, nondistended, positive  bowel sounds all 4 quadrants, no apparent ascites.  EXTREMITIES: trace bilateral pitting peripheral edema  HEMATOLOGIC: No significant bruising NEUROLOGIC: Oriented to person, place, and time. Nonfocal. Normal muscle tone.  PSYCHIATRIC: Normal mood and affect. Normal behavior. Cooperative  CARDIAC DATABASE: EKG: 07/11/2021: Sinus bradycardia, 58 bpm, normal axis, without underlying ischemia or injury pattern.    Echocardiogram: 08/01/2020:  Left ventricle cavity is normal in size. Mild concentric hypertrophy of the left ventricle. Normal global Pelc motion. Normal LV systolic function with visual EF 55-60%. Doppler evidence of grade II (pseudonormal)  diastolic dysfunction, elevated LAP.  Left atrial cavity is moderately dilated.  Mild (Grade I) mitral regurgitation.  Mild tricuspid regurgitation. Estimated pulmonary artery systolic pressure  34 mmHg.   Stress Testing:  January 2014: Myocardial perfusion imaging: Perfusion images reveal a moderate sized inferior and apical nontransmural scar without superimposed ischemia.  LVEF 61%.  Low risk study clinical correlation recommended in a patient who weighs 260 pounds and a BMI of 46.  Heart Catheterization: None  Carotid duplex 07/06/2021: Duplex suggests stenosis in the right internal carotid artery (16-49%). Duplex suggests stenosis in the right external carotid artery (<50%). Duplex suggests stenosis in the left internal carotid artery (50-69%). Duplex suggests stenosis in the left external carotid artery (<50%). Antegrade right vertebral artery flow. Antegrade left vertebral artery flow. Compared to 06/19/2018, bilateral carotid stenosis is new. Follow up in sixmonths is appropriate if clinically indicated.  Lower extremity arterial duplex: 06/22/2018: Biphasic waveform in the right proximal SFA and below suggest diffuse disease no hemodynamically significant stenosis identified in the left lower extremity.  ABI mildly reduced in the  right lower extremity.  Normal ABI suggestive of normal perfusion in the left lower extremity.  LABORATORY DATA: External Labs: Collected: 07/04/2020 Creatinine 2.9 mg/dL. eGFR: 23 mL/min per 1.73 m Sodium 146, BUN 36, BUN to creatinine ratio 12.2 TSH: 0.59    Lipid profile: Collected: 05/03/2020 Total cholesterol 181, triglycerides 285, HDL 57, LDL 67, non-HDL 124  CBC Latest Ref Rng & Units 09/13/2020 08/17/2020 08/16/2020  WBC 4.0 - 10.5 K/uL 5.9 11.9(H) 10.1  Hemoglobin 13.0 - 17.0 g/dL 10.7(L) 10.0(L) 10.7(L)  Hematocrit 39.0 - 52.0 % 33.9(L) 31.0(L) 33.7(L)  Platelets 150 - 400 K/uL 213 200 215   LABORATORY DATA: CBC Latest Ref Rng & Units 09/13/2020 08/17/2020 08/16/2020  WBC 4.0 - 10.5 K/uL 5.9 11.9(H) 10.1  Hemoglobin 13.0 - 17.0 g/dL 10.7(L) 10.0(L) 10.7(L)  Hematocrit 39.0 - 52.0 % 33.9(L) 31.0(L) 33.7(L)  Platelets 150 - 400 K/uL 213 200 215    CMP Latest Ref Rng & Units 08/17/2020 08/16/2020 08/15/2020  Glucose 70 - 99 mg/dL 225(H) 406(H) 78  BUN 8 - 23 mg/dL 57(H) 37(H) 29(H)  Creatinine 0.61 - 1.24 mg/dL 3.29(H) 3.31(H) 2.79(H)  Sodium 135 - 145 mmol/L 137 136 143  Potassium 3.5 - 5.1 mmol/L 3.9 4.1 3.3(L)  Chloride 98 - 111 mmol/L 103 100 108  CO2 22 - 32 mmol/L _0 Calcium 8.9 - 10.3 mg/dL 8.9 8.7(L) 8.9  Total Protein 6.5 - 8.1 g/dL - - 6.1(L)  Total Bilirubin 0.3 - 1.2 mg/dL - - 0.4  Alkaline Phos 38 - 126 U/L - - 55  AST 15 - 41 U/L - - 24  ALT 0 - 44 U/L - - 16    Lipid Panel  No results found for: CHOL, TRIG, HDL, CHOLHDL, VLDL, LDLCALC, LDLDIRECT, LABVLDL  No components found for: NTPROBNP Recent Labs    08/01/20 1039  PROBNP 1,100*   No results for input(s): TSH in the last 8760 hours.  HEMOGLOBIN A1C Lab Results  Component Value Date   HGBA1C 6.6 (H) 07/11/2020   MPG 142.72 07/11/2020   IMPRESSION:    ICD-10-CM   1. Carotid artery stenosis, asymptomatic, bilateral  I65.23 Lipid Panel With LDL/HDL Ratio    LDL cholesterol, direct     CMP14+EGFR    PCV CAROTID DUPLEX (BILATERAL)    2. Benign hypertension  I10 EKG 12-Lead    3. OSA on CPAP  G47.33    Z99.89     4. Type 2 diabetes mellitus with hyperglycemia, without long-term current use of insulin (HCC)  E11.65     5. Type 2 diabetes mellitus with stage 4 chronic kidney disease, without long-term current use of insulin (HCC)  E11.22    N18.4     6. Mixed hyperlipidemia  E78.2     7. Smoking  F17.200     8. Class 3 severe obesity due to excess calories with serious comorbidity and body mass index (BMI) of 40.0 to 44.9 in adult [facility])  E66.01    Z68.41        RECOMMENDATIONS: Woodruff Skirvin. is a 70 y.o. male whose past medical history and cardiovascular risk factors include: Diabetes mellitus type 2, FSGS, carotid artery stenosis, GERD, hyperlipidemia, hypertension, nonsecretory pituitary macroadenoma, rheumatoid arthritis, bronchitis, active tobacco use, advanced age, obesity due to excess calories.  Bilateral carotid artery stenosis: Continue aspirin and statin therapy. Educated on the importance of complete smoking cessation, patient is reluctant. Patient is educated on being more cognizant for focal neurological deficits or vision changes as these may be signs of stroke and should get to the hospital ASAP via EMS. Repeat carotid duplex in 6 months to reevaluate disease progression  Benign essential hypertension: Significantly improved since establishing care. Medications reconciled. Started on spironolactone since last office visit. Reviewed the last office note from July 2022 that was provided by his nephrologist.  Tentatively planning to uptitrate olmesartan and weaning him off of Procardia XL.  I agree with the medication changes. Given his progressive CKD/proteinuria/diabetic nephropathy/FSGS the shared decision was to continue antihypertensive medication titration with his nephrologist.  Greatly appreciate Dr. Joylene Grapes assistance and follow  through.  Hyperlipidemia: Continue statin therapy.  We will check fasting lipid profile since he has carotid artery disease to see if we can uptitrate his current medical therapy.  Tobacco use: Educated on importance of complete smoking cessation.  Patient does not appear to be motivated to stop smoking.   FINAL MEDICATION LIST END OF ENCOUNTER:   Current Outpatient Medications:    acetaminophen (TYLENOL) 650 MG CR tablet, Take 1,300 mg by mouth every 8 (eight) hours as needed for pain., Disp: , Rfl:    albuterol (PROVENTIL HFA;VENTOLIN HFA) 108 (90 Base) MCG/ACT inhaler, Inhale 1-2 puffs into the lungs every 6 (six) hours as needed for wheezing or shortness of breath., Disp: , Rfl:    aspirin EC 81 MG tablet, Take 81 mg by mouth daily. , Disp: , Rfl:    cyclobenzaprine (FLEXERIL) 10 MG tablet, Take 10 mg by mouth at bedtime., Disp: , Rfl:    diclofenac Sodium (VOLTAREN) 1 % GEL, Apply 2 g topically 4 (four) times daily as needed (pain). , Disp: , Rfl:    doxazosin (CARDURA) 2 MG tablet, Take 2 mg by mouth 2 (two) times daily. , Disp: , Rfl:    EPINEPHrine 0.3 mg/0.3 mL IJ SOAJ injection, Inject 0.3 mLs (0.3 mg total) into the muscle as needed for up to 2 doses. (Patient taking differently: Inject 0.3 mg into the muscle as needed for anaphylaxis.), Disp: 2 Device, Rfl: 0   escitalopram (LEXAPRO) 10 MG tablet, Take 10 mg by mouth daily., Disp: , Rfl:    ferrous sulfate 325 (65 FE) MG tablet, Take 325 mg by mouth daily., Disp: , Rfl:    furosemide (LASIX) 80 MG tablet, Take 80 mg by mouth daily., Disp: , Rfl:    isosorbide dinitrate (ISORDIL) 30 MG tablet, TAKE 2 TABLETS (60 MG TOTAL) BY MOUTH 3 (THREE) TIMES DAILY., Disp: 180 tablet, Rfl: 2   labetalol (NORMODYNE) 200 MG tablet, TAKE 1 TABLET BY MOUTH TWICE A DAY, Disp: 180 tablet, Rfl: 0   leflunomide (ARAVA) 20 MG tablet, Take 20 mg by mouth daily., Disp: , Rfl:    levothyroxine (SYNTHROID, LEVOTHROID) 200 MCG tablet, Take 200 mcg by mouth  daily before breakfast. , Disp: , Rfl:    NIFEdipine (PROCARDIA XL/NIFEDICAL XL) 60 MG 24 hr tablet, TAKE 1 TABLET BY MOUTH EVERY DAY, Disp: 90 tablet, Rfl: 1   ONETOUCH VERIO test strip, 3 (three) times daily., Disp: , Rfl:    pravastatin (PRAVACHOL) 40 MG tablet, Take 40 mg by mouth daily. , Disp: , Rfl:    predniSONE (DELTASONE) 5 MG tablet, Take 5 mg by mouth daily as needed (arthritis). , Disp: , Rfl:    spironolactone (ALDACTONE) 25 MG tablet, Take 25 mg by mouth daily., Disp: , Rfl:    telmisartan (MICARDIS) 20 MG tablet, Take 20 mg by mouth daily. , Disp: , Rfl:    TRADJENTA 5 MG TABS tablet, Take 5 mg by mouth daily., Disp: , Rfl:   Orders Placed This Encounter  Procedures   Lipid Panel With LDL/HDL Ratio   LDL cholesterol, direct   CMP14+EGFR   EKG 12-Lead   PCV CAROTID DUPLEX (BILATERAL)    --Continue cardiac medications as reconciled in final medication list. --Return in about 7 months (around 01/29/2022) for Follow up cartoid disease and review test result. . Or sooner if needed. --Continue follow-up with your primary care physician regarding the management of your other chronic comorbid conditions.  Patient's questions and concerns were addressed to his satisfaction. He voices understanding of the instructions provided during this encounter.   This note was created using a voice recognition software as a result there may be grammatical errors inadvertently enclosed that do not reflect the nature of  this encounter. Every attempt is made to correct such errors.  Total time spent: 32 minutes.   Rex Kras, [place], [facility]  Pager: [phone] Office: [phone]

## 2021-07-13 ENCOUNTER — Other Ambulatory Visit: Payer: PRIVATE HEALTH INSURANCE

## 2021-07-13 DIAGNOSIS — I6523 Occlusion and stenosis of bilateral carotid arteries: Secondary | ICD-10-CM | POA: Diagnosis not present

## 2021-07-14 LAB — CMP14+EGFR
ALT: 10 IU/L (ref 0–44)
AST: 14 IU/L (ref 0–40)
Albumin/Globulin Ratio: 1.6 (ref 1.2–2.2)
Albumin: 3.6 g/dL — ABNORMAL LOW (ref 3.8–4.8)
Alkaline Phosphatase: 86 IU/L (ref 44–121)
BUN/Creatinine Ratio: 18 (ref 10–24)
BUN: 65 mg/dL — ABNORMAL HIGH (ref 8–27)
Bilirubin Total: 0.2 mg/dL (ref 0.0–1.2)
CO2: 18 mmol/L — ABNORMAL LOW (ref 20–29)
Calcium: 8.6 mg/dL (ref 8.6–10.2)
Chloride: 108 mmol/L — ABNORMAL HIGH (ref 96–106)
Creatinine, Ser: 3.67 mg/dL — ABNORMAL HIGH (ref 0.76–1.27)
Globulin, Total: 2.2 g/dL (ref 1.5–4.5)
Glucose: 106 mg/dL — ABNORMAL HIGH (ref 65–99)
Potassium: 4.7 mmol/L (ref 3.5–5.2)
Sodium: 140 mmol/L (ref 134–144)
Total Protein: 5.8 g/dL — ABNORMAL LOW (ref 6.0–8.5)
eGFR: 17 mL/min/{1.73_m2} — ABNORMAL LOW (ref >59)

## 2021-07-14 LAB — LIPID PANEL WITH LDL/HDL RATIO
Cholesterol, Total: 120 mg/dL (ref 100–199)
HDL: 46 mg/dL (ref >39)
LDL Chol Calc (NIH): 56 mg/dL (ref 0–99)
LDL/HDL Ratio: 1.2 ratio (ref 0.0–3.6)
Triglycerides: 97 mg/dL (ref 0–149)
VLDL Cholesterol Cal: 18 mg/dL (ref 5–40)

## 2021-07-14 LAB — LDL CHOLESTEROL, DIRECT: LDL Direct: 52 mg/dL (ref 0–99)

## 2021-07-21 NOTE — Progress Notes (Signed)
Labs have been faxed.

## 2021-07-21 NOTE — Progress Notes (Signed)
Called and spoke with patient wife regarding patient lab results. She will call Dr. Osborne Casco office to make appointment for patient and I will fax over lab results to their office.   Beverly : can you get me a fax number to Dr. Osborne Casco office please. Thank you.

## 2021-08-01 ENCOUNTER — Other Ambulatory Visit: Payer: Self-pay | Admitting: Cardiology

## 2021-08-01 DIAGNOSIS — I10 Essential (primary) hypertension: Secondary | ICD-10-CM

## 2021-08-07 DIAGNOSIS — I10 Essential (primary) hypertension: Secondary | ICD-10-CM | POA: Diagnosis not present

## 2021-08-08 ENCOUNTER — Other Ambulatory Visit: Payer: Self-pay | Admitting: Cardiology

## 2021-08-08 DIAGNOSIS — I10 Essential (primary) hypertension: Secondary | ICD-10-CM

## 2021-08-09 DIAGNOSIS — E1122 Type 2 diabetes mellitus with diabetic chronic kidney disease: Secondary | ICD-10-CM | POA: Diagnosis not present

## 2021-08-09 DIAGNOSIS — E7800 Pure hypercholesterolemia, unspecified: Secondary | ICD-10-CM | POA: Diagnosis not present

## 2021-08-09 DIAGNOSIS — E039 Hypothyroidism, unspecified: Secondary | ICD-10-CM | POA: Diagnosis not present

## 2021-08-09 DIAGNOSIS — D631 Anemia in chronic kidney disease: Secondary | ICD-10-CM | POA: Diagnosis not present

## 2021-08-09 DIAGNOSIS — I10 Essential (primary) hypertension: Secondary | ICD-10-CM | POA: Diagnosis not present

## 2021-08-16 DIAGNOSIS — E1122 Type 2 diabetes mellitus with diabetic chronic kidney disease: Secondary | ICD-10-CM | POA: Diagnosis not present

## 2021-08-16 DIAGNOSIS — K219 Gastro-esophageal reflux disease without esophagitis: Secondary | ICD-10-CM | POA: Diagnosis not present

## 2021-08-16 DIAGNOSIS — N184 Chronic kidney disease, stage 4 (severe): Secondary | ICD-10-CM | POA: Diagnosis not present

## 2021-08-16 DIAGNOSIS — D631 Anemia in chronic kidney disease: Secondary | ICD-10-CM | POA: Diagnosis not present

## 2021-08-16 DIAGNOSIS — L729 Follicular cyst of the skin and subcutaneous tissue, unspecified: Secondary | ICD-10-CM | POA: Diagnosis not present

## 2021-08-16 DIAGNOSIS — M0589 Other rheumatoid arthritis with rheumatoid factor of multiple sites: Secondary | ICD-10-CM | POA: Diagnosis not present

## 2021-08-16 DIAGNOSIS — E890 Postprocedural hypothyroidism: Secondary | ICD-10-CM | POA: Diagnosis not present

## 2021-08-16 DIAGNOSIS — I10 Essential (primary) hypertension: Secondary | ICD-10-CM | POA: Diagnosis not present

## 2021-08-16 DIAGNOSIS — N400 Enlarged prostate without lower urinary tract symptoms: Secondary | ICD-10-CM | POA: Diagnosis not present

## 2021-08-16 DIAGNOSIS — K591 Functional diarrhea: Secondary | ICD-10-CM | POA: Diagnosis not present

## 2021-08-16 DIAGNOSIS — J432 Centrilobular emphysema: Secondary | ICD-10-CM | POA: Diagnosis not present

## 2021-08-16 DIAGNOSIS — E7800 Pure hypercholesterolemia, unspecified: Secondary | ICD-10-CM | POA: Diagnosis not present

## 2021-08-23 ENCOUNTER — Telehealth: Payer: Self-pay

## 2021-08-23 NOTE — Telephone Encounter (Signed)
(  4:30 pm) SW scheduled initial palliative care visit with patient. Visit is scheduled for 08/30/21 @ 10 am.

## 2021-08-30 ENCOUNTER — Other Ambulatory Visit: Payer: Medicare Other

## 2021-08-30 ENCOUNTER — Other Ambulatory Visit: Payer: Self-pay

## 2021-08-30 ENCOUNTER — Other Ambulatory Visit: Payer: Medicare Other | Admitting: *Deleted

## 2021-08-30 VITALS — BP 125/59 | HR 54 | Temp 97.9°F | Resp 17 | Ht 68.0 in | Wt 254.0 lb

## 2021-08-30 DIAGNOSIS — Z515 Encounter for palliative care: Secondary | ICD-10-CM

## 2021-08-30 NOTE — Progress Notes (Signed)
[facility] RN NOTE  PATIENT NAME: [name]. DOB: 03-11-1951 MRN: [id number]  PRIMARY CARE PROVIDER: [name], DO  RESPONSIBLE PARTY: Collene Leyden (wife) Acct ID - Guarantor Home Phone Work Phone Relationship Acct Type  [id number] - Spoon JR,RUS* [phone]  Self P/F     [address], [place], [place] [address]   Covid-19 Pre-screening Negative  PLAN OF CARE and INTERVENTION:  ADVANCE CARE PLANNING/GOALS OF CARE: Goal is for patient to have a good quality of life. He requested to be a DNR today and 2 signed copies were left in the home. PATIENT/CAREGIVER EDUCATION: Explained palliative care services vs hospice services, symptom management, safe mobility DISEASE STATUS: Joint visit made with LCSW, M. Lonon. Met with patient, wife [name] and other family members in the home. Patient is alert and oriented x 4, pleasant and engaging. He has a diagnosis of Stage IV chronic kidney disease and states that he is one point away from being Stage 5. He does not wish to have dialysis. He does experience generalized pain in his joints and tendons of his hands, shoulders, feet and knees. He has both rheumatoid and osteoarthritis. He is taking Tylenol arthritis to help along with Leflunomide. He also has an as needed dose of Prednisone if pain/inflammation is severe. He also has some numbnessin both feet.He denies shortness of breath. He does feel tired often, but this has improved some since being placed on Iron supplements. He ambulates using a walker both inside and outside although he did not utilize it when walking during visit today. No recent falls. Some days he is unable to walk much at all. He remains independent with all ADLs. He remains able to drive. He says his appetite is slowly improving. He eats 2 meals/day (breakfast and dinner) and usually only a snack at lunch time to take his medications. His diabetes are well controlled and he says his last A1C was 5.4. He only  checks his blood sugars as needed. Patient says that he used to weigh 312 lbs about 4 years ago. Initially he was trying to lose weight, but now has been trying to maintain his weight. Even with the improvement in his diet he says he keeps losing weight. He has lost 9 lbs since last month. Current weight 254 lbs. He does have some trace edema in bilateral lower extremities but says they have improved significantly with the use of Lasix 80 mg. He used to take this twice daily but has reduced it to daily due to frequent urination. Edema is well controlled on 1 dose for now. He does experience some dizziness at times, but not as often since he has adjusted the way he takes his BP medications. He used to take them all at once in the mornings, now has spread them out throughout the day. He states that his blood pressure starts out good in the mornings, 120s/60s but every evening around 630/7p it elevates to 160s/70s. He will discuss this with his Nephrologist on his appointment next month. He has lab work scheduled on 09/18/21 then will see him the following week. He is agreeable to future visits with palliative care. Will follow-up again with patient after his October appointment.   HISTORY OF PRESENT ILLNESS:  This is a 70 yo male with a diagnosis of Stage IV chronic kidney disease that does not wish to have dialysis. He has a history of DM II, sleep apnea (wears cpap at night), rheumatoid arthritis, osteoarthritis and hyperlipidemia. Palliative  care team has been asked to follow patient for goals of care, assist with symptom management and complex decision making.  CODE STATUS: DNR  ADVANCED DIRECTIVES: Y MOST FORM: no PPS: 60%   PHYSICAL EXAM:   VITALS: Today's Vitals   08/30/21 1040  BP: (!) 125/59  Pulse: (!) 54  Resp: 17  Temp: 97.9 F (36.6 C)  TempSrc: Temporal  SpO2: 95%  PainSc: 3   PainLoc: Generalized    LUNGS: clear to auscultation  CARDIAC: Cor Brady EXTREMITIES: Trace lower  extremity edema (much improved per patient) SKIN:  Exposed skin is dry and intact   NEURO:  Alert and oriented x 4, pleasant mood, generalized weakness, ambulatory w/walker   (Duration of visit and documentation 75 minutes)   Daryl Eastern, RN BSN

## 2021-08-31 NOTE — Progress Notes (Signed)
COMMUNITY PALLIATIVE CARE SW NOTE  PATIENT NAME: [name]. DOB: 1951-01-28 MRN: [id number]  PRIMARY CARE PROVIDER: [name], DO  RESPONSIBLE PARTY:  Acct ID - Guarantor Home Phone Work Phone Relationship Acct Type  [id number] - [place] JR,RUS* [phone]  Self P/F     [address], [place], [place] [address]     PLAN OF CARE and INTERVENTIONS:             GOALS OF CARE/ ADVANCE CARE PLANNING:  Goal is for patient to remain in his home and have the best quality of life as possible. DNR completed on this visit and forms left in the home.  SOCIAL/EMOTIONAL/SPIRITUAL ASSESSMENT/ INTERVENTIONS:  SW and RN-M. Nadara Mustard completed an initial visit with patient at his home. He was present with his wife/PCG-[name] and two children. The family was provided education regarding the palliative care program and verbal consent to services was provided. Patient provided medical history and status update on himself. Patient is diagnosed with Stage 4 Chronic Kidney Disease, and is only one point away from Stage 5. Patient states as he does not want dialysis, instead he would like live with whatever time left he has at the best quality of life possible. Patient report that has health has been on the decline for the past year. He has rheumatoid and osteoarthritis and has pain or stiffness to his hands, shoulders, knees and feet.  He also report neuropathy in his feet where has has numbness and tingling in his feet. Patient report having difficulty ambulating at times where utilizes a walker inside and outside of the home. Patient report no falls, but has had few falls in the past. Patient report increased fatigue, but has seen some improvement since talking iron supplements. He reports that his appetite has declined overall, but has picked up in the past month or so. He is eating at least 2 good meals a day, usually breakfast and dinner and small snack for lunch. Patient report that his current weight is 254 lbs and he  continues to loose without much effort to. Patient report not sleeping well at night as he is up and down to the bathroom. He denies any SOB. He reports some intermittent dizziness. Patient also report increased episodes of diarrhea and gas. Patient report some swallowing issues, that has improved with taking Protonix. Patient is independent for all ADL's. He continues to enjoy being outside, fishing  being on his computer and spending time with his family when he is up to it. Patient report that he does have days where he does not feel like doing anything.Patient manages his own medications. SW provided education, supportive presence, assessment of patient's needs and comfort, coping of PCG/family, clarification of goals,DNR completed, forms left int he home and reassurance of support. The patient/family remains open to ongoing palliative care support.  PATIENT/CAREGIVER EDUCATION/ COPING:  Patient is alert and oriented x 4. He appears to be realistic about his disease process and has accepted it. He has great family support. Patient also is making efforts to have the best quality of life to do the things he loves. PERSONAL EMERGENCY PLAN:  911 can be activated for emergencies. COMMUNITY RESOURCES COORDINATION/ HEALTH CARE NAVIGATION:  None. FINANCIAL/LEGAL CONCERNS/INTERVENTIONS:  None.     SOCIAL HX:  Social History   Tobacco Use   Smoking status: Every Day    Packs/day: 1.00    Years: 40.00    Pack years: 40.00    Types: Cigarettes   Smokeless  tobacco: Never  Substance Use Topics   Alcohol use: Yes    Comment: occasionally    CODE STATUS: DNR completed ADVANCED DIRECTIVES: No MOST FORM COMPLETE:  No HOSPICE EDUCATION PROVIDED: No  PPS: Patient is alert and oriented x4. Patient is independent for ADL's. He ambulates with a walker intermittently. Patient report increased fatigue.  Duration if visit and documentation: 60 minutes.  [address] [place], [place]

## 2021-09-06 DIAGNOSIS — I10 Essential (primary) hypertension: Secondary | ICD-10-CM | POA: Diagnosis not present

## 2021-09-18 DIAGNOSIS — N184 Chronic kidney disease, stage 4 (severe): Secondary | ICD-10-CM | POA: Diagnosis not present

## 2021-09-22 DIAGNOSIS — D443 Neoplasm of uncertain behavior of pituitary gland: Secondary | ICD-10-CM | POA: Diagnosis not present

## 2021-09-22 DIAGNOSIS — E890 Postprocedural hypothyroidism: Secondary | ICD-10-CM | POA: Diagnosis not present

## 2021-09-22 DIAGNOSIS — E1122 Type 2 diabetes mellitus with diabetic chronic kidney disease: Secondary | ICD-10-CM | POA: Diagnosis not present

## 2021-09-28 ENCOUNTER — Other Ambulatory Visit: Payer: Self-pay | Admitting: Cardiology

## 2021-09-28 DIAGNOSIS — E1122 Type 2 diabetes mellitus with diabetic chronic kidney disease: Secondary | ICD-10-CM | POA: Diagnosis not present

## 2021-09-28 DIAGNOSIS — D631 Anemia in chronic kidney disease: Secondary | ICD-10-CM | POA: Diagnosis not present

## 2021-09-28 DIAGNOSIS — R000 Tachycardia, unspecified: Secondary | ICD-10-CM | POA: Diagnosis not present

## 2021-09-28 DIAGNOSIS — N2581 Secondary hyperparathyroidism of renal origin: Secondary | ICD-10-CM | POA: Diagnosis not present

## 2021-09-28 DIAGNOSIS — E039 Hypothyroidism, unspecified: Secondary | ICD-10-CM | POA: Diagnosis not present

## 2021-09-28 DIAGNOSIS — E785 Hyperlipidemia, unspecified: Secondary | ICD-10-CM | POA: Diagnosis not present

## 2021-09-28 DIAGNOSIS — I10 Essential (primary) hypertension: Secondary | ICD-10-CM

## 2021-09-28 DIAGNOSIS — I129 Hypertensive chronic kidney disease with stage 1 through stage 4 chronic kidney disease, or unspecified chronic kidney disease: Secondary | ICD-10-CM | POA: Diagnosis not present

## 2021-09-28 DIAGNOSIS — N184 Chronic kidney disease, stage 4 (severe): Secondary | ICD-10-CM | POA: Diagnosis not present

## 2021-09-29 DIAGNOSIS — D443 Neoplasm of uncertain behavior of pituitary gland: Secondary | ICD-10-CM | POA: Diagnosis not present

## 2021-09-29 DIAGNOSIS — Z23 Encounter for immunization: Secondary | ICD-10-CM | POA: Diagnosis not present

## 2021-09-29 DIAGNOSIS — I10 Essential (primary) hypertension: Secondary | ICD-10-CM | POA: Diagnosis not present

## 2021-09-29 DIAGNOSIS — E890 Postprocedural hypothyroidism: Secondary | ICD-10-CM | POA: Diagnosis not present

## 2021-09-29 DIAGNOSIS — C73 Malignant neoplasm of thyroid gland: Secondary | ICD-10-CM | POA: Diagnosis not present

## 2021-09-29 DIAGNOSIS — E1122 Type 2 diabetes mellitus with diabetic chronic kidney disease: Secondary | ICD-10-CM | POA: Diagnosis not present

## 2021-10-02 ENCOUNTER — Encounter: Payer: Self-pay | Admitting: Pharmacist

## 2021-10-02 NOTE — Progress Notes (Signed)
CARE PLAN ENTRY  10/02/2021 Name: [name]. MRN: [id number] DOB: 1951-04-15  [name]. is enrolled in Remote Patient Monitoring/Principle Care Monitoring.  Date of Enrollment: 07/14/20 Supervising physician: [name] Indication: HTN  Remote Readings: Compliant and Avg BP: 150/71, HR:68  Next scheduled OV: 01/08/22  Pharmacist Clinical Goal(s):  Over the next 90 days, patient will demonstrate Improved medication adherence as evidenced by medication fill history Over the next 90 days, patient will demonstrate improved understanding of prescribed medications and rationale for usage as evidenced by patient teach back Over the next 90 days, patient will experience decrease in ED visits. ED visits in last 6 months = 0 Over the next 90 days, patient will not experience hospital admission. Hospital Admissions in last 6 months = 0  Interventions: Provider and Inter-disciplinary care team collaboration (see longitudinal plan of care) Comprehensive medication review performed. Discussed plans with patient for ongoing care management follow up and provided patient with direct contact information for care management team Collaboration with provider re: medication management  Patient Self Care Activities:  Self administers medications as prescribed Attends all scheduled provider appointments Performs ADL's independently Performs IADL's independently  Allergies  Allergen Reactions   Atorvastatin Other (See Comments)    unknown   [place] [Dapagliflozin] Other (See Comments)    Yeast infection    Folic Acid Hives   Humira [Adalimumab] Other (See Comments)    Caused a stroke    Metformin And Related Other (See Comments)    Weak if takes 1000 mg   Plavix [Clopidogrel Bisulfate] Hives   Simvastatin Other (See Comments)    Couldn't think straight    Outpatient Encounter Medications as of 10/02/2021  Medication Sig Note   acetaminophen (TYLENOL) 650 MG CR tablet Take  1,300 mg by mouth every 8 (eight) hours as needed for pain.    albuterol (PROVENTIL HFA;VENTOLIN HFA) 108 (90 Base) MCG/ACT inhaler Inhale 1-2 puffs into the lungs every 6 (six) hours as needed for wheezing or shortness of breath.    aspirin EC 81 MG tablet Take 81 mg by mouth daily.  09/08/2020: On hold for upcoming biopsy   cyclobenzaprine (FLEXERIL) 10 MG tablet Take 10 mg by mouth at bedtime.    diclofenac Sodium (VOLTAREN) 1 % GEL Apply 2 g topically 4 (four) times daily as needed (pain).     doxazosin (CARDURA) 2 MG tablet Take 2 mg by mouth 2 (two) times daily.     EPINEPHrine 0.3 mg/0.3 mL IJ SOAJ injection Inject 0.3 mLs (0.3 mg total) into the muscle as needed for up to 2 doses. (Patient taking differently: Inject 0.3 mg into the muscle as needed for anaphylaxis.)    escitalopram (LEXAPRO) 10 MG tablet Take 10 mg by mouth daily.    ferrous sulfate 325 (65 FE) MG tablet Take 325 mg by mouth daily.    furosemide (LASIX) 80 MG tablet Take 80 mg by mouth daily.    isosorbide dinitrate (ISORDIL) 30 MG tablet TAKE 2 TABLETS (60 MG TOTAL) BY MOUTH 3 (THREE) TIMES DAILY.    labetalol (NORMODYNE) 200 MG tablet TAKE 1 TABLET BY MOUTH TWICE A DAY    leflunomide (ARAVA) 20 MG tablet Take 20 mg by mouth daily.    levothyroxine (SYNTHROID, LEVOTHROID) 200 MCG tablet Take 200 mcg by mouth daily before breakfast.     NIFEdipine (PROCARDIA XL/NIFEDICAL XL) 60 MG 24 hr tablet TAKE 1 TABLET BY MOUTH EVERY DAY    ONETOUCH VERIO test  strip 3 (three) times daily.    pravastatin (PRAVACHOL) 40 MG tablet Take 40 mg by mouth daily.     predniSONE (DELTASONE) 5 MG tablet Take 5 mg by mouth daily as needed (arthritis).     spironolactone (ALDACTONE) 25 MG tablet Take 25 mg by mouth daily.    telmisartan (MICARDIS) 20 MG tablet Take 20 mg by mouth daily.     TRADJENTA 5 MG TABS tablet Take 5 mg by mouth daily.    No facility-administered encounter medications on file as of 10/02/2021.    Hypertension   BP  goal is:  <140/90  Office blood pressures are  BP Readings from Last 3 Encounters:  08/30/21 (!) 125/59  07/11/21 (!) 129/57  01/11/21 118/84    Patient is currently uncontrolled on the following medications: isordil 60 mg TID, labetalol 200 mg BID, spironolactone 25 mg, nifedipine 60 mg, telmisartan 20 mg, doxazosin 2 mg BID, lasix 80 mg  Patient checks BP at home daily  Patient home BP readings are ranging: 130-175/61-99  We discussed diet and exercise extensively  Plan  Continue current medications and control with diet and exercise   ______________ Visit Information SDOH (Social Determinants of Health) assessments performed: Yes.  Mr. [name] was given information about Principle Care Management/Remote Patient Monitoring services today including:  RPM/PCM service includes personalized support from designated clinical staff supervised by his physician, including individualized plan of care and coordination with other care providers 24/7 contact phone numbers for assistance for urgent and routine care needs. Standard insurance, coinsurance, copays and deductibles apply for principle care management only during months in which we provide at least 30 minutes of these services. Most insurances cover these services at 100%, however patients may be responsible for any copay, coinsurance and/or deductible if applicable. This service may help you avoid the need for more expensive face-to-face services. Only one practitioner may furnish and bill the service in a calendar month. The patient may stop PCM/RPM services at any time (effective at the end of the month) by phone call to the office staff.  Patient agreed to services and verbal consent obtained.   [name], Pharm.D. [facility] [phone] Ext: [phone]

## 2021-10-07 DIAGNOSIS — I10 Essential (primary) hypertension: Secondary | ICD-10-CM | POA: Diagnosis not present

## 2021-10-08 ENCOUNTER — Encounter: Payer: Self-pay | Admitting: Cardiology

## 2021-10-08 NOTE — Progress Notes (Signed)
Office Note from Dr. Santiago Bumpers sent to the [facility]  RE: [place] dated 09/28/2021  Biopsy was performed which demonstrates (preliminary) severe arterionephrosclerosis with diffuse severe IFTA superimposed FSGS and diabetic glomerular nephropathy.   No signs of active GN.  Baseline creatinine felt to be around 3.5-3.8 mg/dL and EGFR 15-20 mL/min per 1.73 m.   Nephrology agrees with continuation of telmisartan and spironolactone.  They would like to be notified if these medications needed to be held.  Patient was noted to be tachycardic during the encounter and therefore nifedipine was increased to 90 mg p.o. daily.  Not able to do EKG.  Rex Kras, [place], [address] [facility]  Pager: [phone] Office: [phone]

## 2021-10-08 NOTE — Progress Notes (Signed)
External Labs: Collected: 09/18/2021, provided by his nephrologist office BUN 50, creatinine 3.34 mg/dL. eGFR 10 ml/mim/1.63m Sodium 140, potassium 4.4, chloride 110, bicarb 25 Hemoglobin 8.9

## 2021-10-10 DIAGNOSIS — H10013 Acute follicular conjunctivitis, bilateral: Secondary | ICD-10-CM | POA: Diagnosis not present

## 2021-10-28 ENCOUNTER — Other Ambulatory Visit: Payer: Self-pay | Admitting: Cardiology

## 2021-10-28 DIAGNOSIS — I10 Essential (primary) hypertension: Secondary | ICD-10-CM

## 2021-11-06 DIAGNOSIS — N184 Chronic kidney disease, stage 4 (severe): Secondary | ICD-10-CM | POA: Diagnosis not present

## 2021-11-13 DIAGNOSIS — I129 Hypertensive chronic kidney disease with stage 1 through stage 4 chronic kidney disease, or unspecified chronic kidney disease: Secondary | ICD-10-CM | POA: Diagnosis not present

## 2021-11-13 DIAGNOSIS — E039 Hypothyroidism, unspecified: Secondary | ICD-10-CM | POA: Diagnosis not present

## 2021-11-13 DIAGNOSIS — N2581 Secondary hyperparathyroidism of renal origin: Secondary | ICD-10-CM | POA: Diagnosis not present

## 2021-11-13 DIAGNOSIS — D631 Anemia in chronic kidney disease: Secondary | ICD-10-CM | POA: Diagnosis not present

## 2021-11-13 DIAGNOSIS — N184 Chronic kidney disease, stage 4 (severe): Secondary | ICD-10-CM | POA: Diagnosis not present

## 2021-11-13 DIAGNOSIS — E785 Hyperlipidemia, unspecified: Secondary | ICD-10-CM | POA: Diagnosis not present

## 2021-11-13 DIAGNOSIS — R000 Tachycardia, unspecified: Secondary | ICD-10-CM | POA: Diagnosis not present

## 2021-11-13 DIAGNOSIS — E1122 Type 2 diabetes mellitus with diabetic chronic kidney disease: Secondary | ICD-10-CM | POA: Diagnosis not present

## 2021-12-03 ENCOUNTER — Other Ambulatory Visit: Payer: Self-pay | Admitting: Cardiology

## 2021-12-03 DIAGNOSIS — I10 Essential (primary) hypertension: Secondary | ICD-10-CM

## 2021-12-07 ENCOUNTER — Other Ambulatory Visit: Payer: Self-pay | Admitting: Cardiology

## 2021-12-07 DIAGNOSIS — I10 Essential (primary) hypertension: Secondary | ICD-10-CM

## 2021-12-11 DIAGNOSIS — M791 Myalgia, unspecified site: Secondary | ICD-10-CM | POA: Diagnosis not present

## 2021-12-11 DIAGNOSIS — H938X1 Other specified disorders of right ear: Secondary | ICD-10-CM | POA: Diagnosis not present

## 2021-12-11 DIAGNOSIS — M542 Cervicalgia: Secondary | ICD-10-CM | POA: Diagnosis not present

## 2021-12-11 DIAGNOSIS — H6122 Impacted cerumen, left ear: Secondary | ICD-10-CM | POA: Diagnosis not present

## 2021-12-11 DIAGNOSIS — J320 Chronic maxillary sinusitis: Secondary | ICD-10-CM | POA: Diagnosis not present

## 2021-12-26 ENCOUNTER — Ambulatory Visit: Payer: Medicare Other

## 2021-12-26 ENCOUNTER — Other Ambulatory Visit: Payer: Self-pay

## 2021-12-26 DIAGNOSIS — I10 Essential (primary) hypertension: Secondary | ICD-10-CM | POA: Diagnosis not present

## 2021-12-26 DIAGNOSIS — I6523 Occlusion and stenosis of bilateral carotid arteries: Secondary | ICD-10-CM | POA: Diagnosis not present

## 2021-12-26 DIAGNOSIS — M7989 Other specified soft tissue disorders: Secondary | ICD-10-CM | POA: Diagnosis not present

## 2022-01-08 ENCOUNTER — Other Ambulatory Visit: Payer: Self-pay

## 2022-01-08 ENCOUNTER — Encounter: Payer: Self-pay | Admitting: Cardiology

## 2022-01-08 ENCOUNTER — Ambulatory Visit: Payer: Medicare Other | Admitting: Cardiology

## 2022-01-08 VITALS — BP 150/63 | HR 53 | Temp 97.7°F | Resp 16 | Ht 68.0 in | Wt 256.8 lb

## 2022-01-08 DIAGNOSIS — E1122 Type 2 diabetes mellitus with diabetic chronic kidney disease: Secondary | ICD-10-CM

## 2022-01-08 DIAGNOSIS — E1165 Type 2 diabetes mellitus with hyperglycemia: Secondary | ICD-10-CM

## 2022-01-08 DIAGNOSIS — F17210 Nicotine dependence, cigarettes, uncomplicated: Secondary | ICD-10-CM | POA: Diagnosis not present

## 2022-01-08 DIAGNOSIS — G4733 Obstructive sleep apnea (adult) (pediatric): Secondary | ICD-10-CM

## 2022-01-08 DIAGNOSIS — I6523 Occlusion and stenosis of bilateral carotid arteries: Secondary | ICD-10-CM | POA: Diagnosis not present

## 2022-01-08 DIAGNOSIS — E782 Mixed hyperlipidemia: Secondary | ICD-10-CM

## 2022-01-08 DIAGNOSIS — N184 Chronic kidney disease, stage 4 (severe): Secondary | ICD-10-CM | POA: Diagnosis not present

## 2022-01-08 DIAGNOSIS — Z9989 Dependence on other enabling machines and devices: Secondary | ICD-10-CM

## 2022-01-08 DIAGNOSIS — F17200 Nicotine dependence, unspecified, uncomplicated: Secondary | ICD-10-CM

## 2022-01-08 DIAGNOSIS — Z720 Tobacco use: Secondary | ICD-10-CM | POA: Diagnosis not present

## 2022-01-08 DIAGNOSIS — I10 Essential (primary) hypertension: Secondary | ICD-10-CM | POA: Diagnosis not present

## 2022-01-08 NOTE — Progress Notes (Signed)
Pt aware.

## 2022-01-08 NOTE — Progress Notes (Signed)
Patient was in office today and results where given to him.

## 2022-01-08 NOTE — Progress Notes (Signed)
[name]. Date of Birth: 02-19-1951 MRN: [id number] Primary Care Provider:Collins, Hinton Dyer, DO Former Cardiology Providers: Dr. Adrian Prows Primary Cardiologist: Rex Kras, DO, [facility] (established care 07/07/2020) Primary nephrologist: Dr. Santiago Bumpers   Date: 01/08/22 Last Office Visit: 07/11/2021  Chief Complaint  Patient presents with   cartoid disease   Results   Follow-up    HPI  Dwayne Vaile. is a 71 y.o.  male who presents to the office with a chief complaint of " 4monthfollow-up for carotid disease management." Patient's past medical history and cardiovascular risk factors include: Diabetes mellitus type 2, FSGS, carotid artery stenosis, GERD, hyperlipidemia, hypertension, nonsecretory pituitary macroadenoma, rheumatoid arthritis, bronchitis, active tobacco use, advanced age, obesity due to excess calories.  Referred to the office back in August 2021 for evaluation and management of hypertension.  This patient is accompanied in the office by his spouse. Dwayne BakkenWall Jr. provides verbal consent with regards to having her present during today's encounter.  Prior to establishing care his blood pressure would be around 198/87 mmHg his medications were uptitrated in a stepwise fashion however due to worsening renal function he was referred to nephrology for further evaluation and management.  Based on the last office note from Dr. SMyrene Buddythat was received by the office dated 09/28/2021 patient demonstrates severe arterionephrosclerosis with diffuse severe IFTA superimposed FSGS and diabetic glomerular nephropathy.  For the last several office visits his blood pressure is currently being managed by his nephrologist.  He is enrolled into medical care management for longitudinal follow-up of ambulatory blood pressures; however, the patient has not been checking it on a regular basis as per the log.  Outside labs independently reviewed from October 2022.  He has an  upcoming appointment with his nephrologist.  With regards to carotid artery disease patient remains asymptomatic.  He has had a carotid duplex in the last office encounter which notes improvement in disease involving the left ICA.  Right ICA disease remains relatively stable.  He has lost an additional 7 pounds in the last office visit.  He has reduced his smoking to less than half a pack per day when compared to before it was 1 pack/day.  Most recent echocardiogram results reviewed as part of today's office visit as well and discussed with both the patient and his wife.   FUNCTIONAL STATUS: No structured exercise program or daily routine.  ALLERGIES: Allergies  Allergen Reactions   Atorvastatin Other (See Comments)    unknown   FIran[Dapagliflozin] Other (See Comments)    Yeast infection    Folic Acid Hives   Humira [Adalimumab] Other (See Comments)    Caused a stroke    Metformin And Related Other (See Comments)    Weak if takes 1000 mg   Plavix [Clopidogrel Bisulfate] Hives   Simvastatin Other (See Comments)    Couldn't think straight     MEDICATION LIST PRIOR TO VISIT: Current Outpatient Medications on File Prior to Visit  Medication Sig Dispense Refill   acetaminophen (TYLENOL) 650 MG CR tablet Take 1,300 mg by mouth every 8 (eight) hours as needed for pain.     albuterol (PROVENTIL HFA;VENTOLIN HFA) 108 (90 Base) MCG/ACT inhaler Inhale 1-2 puffs into the lungs every 6 (six) hours as needed for wheezing or shortness of breath.     aspirin EC 81 MG tablet Take 81 mg by mouth daily.      cetirizine (ZYRTEC) 10 MG tablet Take 10 mg by mouth  daily.     cyclobenzaprine (FLEXERIL) 10 MG tablet Take 10 mg by mouth at bedtime.     diclofenac Sodium (VOLTAREN) 1 % GEL Apply 2 g topically 4 (four) times daily as needed (pain).      doxazosin (CARDURA) 2 MG tablet Take 2 mg by mouth 2 (two) times daily.      EPINEPHrine 0.3 mg/0.3 mL IJ SOAJ injection Inject 0.3 mLs (0.3 mg total)  into the muscle as needed for up to 2 doses. (Patient taking differently: Inject 0.3 mg into the muscle as needed for anaphylaxis.) 2 Device 0   escitalopram (LEXAPRO) 10 MG tablet Take 10 mg by mouth daily.     ferrous sulfate 325 (65 FE) MG tablet Take 325 mg by mouth daily.     furosemide (LASIX) 80 MG tablet Take 80 mg by mouth daily.     isosorbide dinitrate (ISORDIL) 30 MG tablet TAKE 2 TABLETS (60 MG TOTAL) BY MOUTH 3 (THREE) TIMES DAILY. 180 tablet 2   labetalol (NORMODYNE) 200 MG tablet TAKE 1 TABLET BY MOUTH TWICE A DAY 180 tablet 0   leflunomide (ARAVA) 20 MG tablet Take 20 mg by mouth daily.     levothyroxine (SYNTHROID, LEVOTHROID) 200 MCG tablet Take 200 mcg by mouth daily before breakfast.      montelukast (SINGULAIR) 10 MG tablet Take 10 mg by mouth at bedtime.     NIFEdipine (PROCARDIA XL/NIFEDICAL XL) 60 MG 24 hr tablet TAKE 1 TABLET BY MOUTH EVERY DAY 90 tablet 1   ONETOUCH VERIO test strip 3 (three) times daily.     pantoprazole (PROTONIX) 20 MG tablet Take 20 mg by mouth daily.     pravastatin (PRAVACHOL) 40 MG tablet Take 40 mg by mouth daily.      predniSONE (DELTASONE) 5 MG tablet Take 5 mg by mouth daily as needed (arthritis).      spironolactone (ALDACTONE) 25 MG tablet Take 25 mg by mouth daily.     telmisartan (MICARDIS) 20 MG tablet Take 20 mg by mouth daily.      TRADJENTA 5 MG TABS tablet Take 5 mg by mouth daily.     No current facility-administered medications on file prior to visit.    PAST MEDICAL HISTORY: Past Medical History:  Diagnosis Date   Asthma    Chronic kidney disease    Diabetes mellitus without complication (HCC)    Hyperlipidemia    Hypertension    Hypothyroidism    Obesity    Pituitary macroadenoma (HCC)    Rheumatoid arthritis ([place])    Sleep apnea     PAST SURGICAL HISTORY: Past Surgical History:  Procedure Laterality Date   Arthroscopic knee surgery Right 1998   Gamma knife surgery  2011   HAND SURGERY     THYROIDECTOMY      TONSILLECTOMY     TUMOR REMOVAL     Pituitary    FAMILY HISTORY: The patient's family history includes Emphysema in his father; Healthy in his brother, sister, and sister; Hypertension in his mother.   SOCIAL HISTORY:  The patient  reports that he has been smoking cigarettes. He has a 40.00 pack-year smoking history. He has never used smokeless tobacco. He reports current alcohol use. He reports that he does not currently use drugs after having used the following drugs: Marijuana.  Review of Systems  Constitutional: Positive for weight loss. Negative for chills and fever.  HENT:  Negative for hoarse voice and nosebleeds.   Eyes:  Negative for  discharge, double vision and pain.  Cardiovascular:  Positive for dyspnea on exertion (improved. ). Negative for chest pain, claudication, leg swelling, near-syncope, orthopnea, palpitations, paroxysmal nocturnal dyspnea and syncope.  Respiratory:  Negative for hemoptysis and shortness of breath.   Musculoskeletal:  Negative for muscle cramps and myalgias.  Gastrointestinal:  Positive for dysphagia. Negative for abdominal pain, constipation, diarrhea, hematemesis, hematochezia, melena, nausea and vomiting.  Neurological:  Negative for dizziness and light-headedness.   PHYSICAL EXAM: Vitals with BMI 01/08/2022 08/30/2021 07/11/2021  Height '5\' 8"'  '5\' 8"'  '5\' 8"'   Weight 256 lbs 13 oz 254 lbs 263 lbs 6 oz  BMI 39.06 85.02 77.41  Systolic 287 867 672  Diastolic 63 59 57  Pulse 53 54 61   CONSTITUTIONAL: Well-developed and well-nourished. No acute distress.  SKIN: Skin is warm and dry. No rash noted. No cyanosis. No pallor. No jaundice HEAD: Normocephalic and atraumatic.  EYES: No scleral icterus MOUTH/THROAT: Moist oral membranes.  NECK: No JVD present. No thyromegaly noted. Bilateral carotid bruit LYMPHATIC: No visible cervical adenopathy.  CHEST Normal respiratory effort. No intercostal retractions  LUNGS: Decreased breath sounds bilaterally.  No  stridor. No wheezes. No rales.  CARDIOVASCULAR: Regular positives S1-S2, no gallops rubs or murmurs appreciated ABDOMINAL: Obese soft, nontender, nondistended, positive bowel sounds all 4 quadrants, no apparent ascites.  EXTREMITIES: trace bilateral pitting peripheral edema  HEMATOLOGIC: No significant bruising NEUROLOGIC: Oriented to person, place, and time. Nonfocal. Normal muscle tone.  PSYCHIATRIC: Normal mood and affect. Normal behavior. Cooperative  CARDIAC DATABASE: EKG: 07/11/2021: Sinus bradycardia, 58 bpm, normal axis, without underlying ischemia or injury pattern.    Echocardiogram: 12/25/2021: Normal LV systolic function with EF 65%. Left ventricle cavity is normal in size. Moderate concentric hypertrophy of the left ventricle. Normal global [place] motion.  Left atrial cavity is severely dilated. Aortic valve not well visualized. Trace aortic stenosis. Vmax 2.1 m/sec, mean PG 9 mmHg, AVA 2.5 cm by continuity equation. Mild to moderate mitral regurgitation. Mild to moderate tricuspid regurgitation. Estimated pulmonary artery systolic pressure 31 mmHg.  Compared to previous study in 2013, LA dilatation increased from slight. MR, TR are new.   Stress Testing:  January 2014: Myocardial perfusion imaging: Perfusion images reveal a moderate sized inferior and apical nontransmural scar without superimposed ischemia.  LVEF 61%.  Low risk study clinical correlation recommended in a patient who weighs 260 pounds and a BMI of 46.  Heart Catheterization: None  Carotid artery duplex 12/26/2020: Duplex suggests stenosis in the right internal carotid artery (16-49%). Duplex suggests stenosis in the left internal carotid artery (16-49%). There is mild heterogenous plaque noted in bilateral carotid arteries. Right vertebral artery flow is not visualized. Antegrade left vertebral artery flow. Compared to the study done on 07/06/2021, there is regression of bilateral carotid artery stenosis.  Follow-up studies if clinically indicated.  Lower extremity arterial duplex: 06/22/2018: Biphasic waveform in the right proximal SFA and below suggest diffuse disease no hemodynamically significant stenosis identified in the left lower extremity.  ABI mildly reduced in the right lower extremity.  Normal ABI suggestive of normal perfusion in the left lower extremity.  LABORATORY DATA: External Labs: Collected: 07/04/2020 Creatinine 2.9 mg/dL. eGFR: 23 mL/min per 1.73 m Sodium 146, BUN 36, BUN to creatinine ratio 12.2 TSH: 0.59    Lipid profile: Collected: 05/03/2020 Total cholesterol 181, triglycerides 285, HDL 57, LDL 67, non-HDL 124  CBC Latest Ref Rng & Units 09/13/2020 08/17/2020 08/16/2020  WBC 4.0 - 10.5 K/uL 5.9 11.9(H) 10.1  Hemoglobin  13.0 - 17.0 g/dL 10.7(L) 10.0(L) 10.7(L)  Hematocrit 39.0 - 52.0 % 33.9(L) 31.0(L) 33.7(L)  Platelets 150 - 400 K/uL 213 200 215   LABORATORY DATA: CBC Latest Ref Rng & Units 09/13/2020 08/17/2020 08/16/2020  WBC 4.0 - 10.5 K/uL 5.9 11.9(H) 10.1  Hemoglobin 13.0 - 17.0 g/dL 10.7(L) 10.0(L) 10.7(L)  Hematocrit 39.0 - 52.0 % 33.9(L) 31.0(L) 33.7(L)  Platelets 150 - 400 K/uL 213 200 215    CMP Latest Ref Rng & Units 07/13/2021 08/17/2020 08/16/2020  Glucose 65 - 99 mg/dL 106(H) 225(H) 406(H)  BUN 8 - 27 mg/dL 65(H) 57(H) 37(H)  Creatinine 0.76 - 1.27 mg/dL 3.67(H) 3.29(H) 3.31(H)  Sodium 134 - 144 mmol/L 140 137 136  Potassium 3.5 - 5.2 mmol/L 4.7 3.9 4.1  Chloride 96 - 106 mmol/L 108(H) 103 100  CO2 20 - 29 mmol/L 18(L) 23 23  Calcium 8.6 - 10.2 mg/dL 8.6 8.9 8.7(L)  Total Protein 6.0 - 8.5 g/dL 5.8(L) - -  Total Bilirubin 0.0 - 1.2 mg/dL 0.2 - -  Alkaline Phos 44 - 121 IU/L 86 - -  AST 0 - 40 IU/L 14 - -  ALT 0 - 44 IU/L 10 - -    Lipid Panel     Component Value Date/Time   CHOL 120 07/13/2021 1000   TRIG 97 07/13/2021 1000   HDL 46 07/13/2021 1000   LDLCALC 56 07/13/2021 1000   LDLDIRECT 52 07/13/2021 1000   LABVLDL 18 07/13/2021 1000     No components found for: NTPROBNP No results for input(s): PROBNP in the last 8760 hours.  No results for input(s): TSH in the last 8760 hours.  HEMOGLOBIN A1C Lab Results  Component Value Date   HGBA1C 6.6 (H) 07/11/2020   MPG 142.72 07/11/2020   IMPRESSION:    ICD-10-CM   1. Carotid artery stenosis, asymptomatic, bilateral  I65.23     2. Benign hypertension  I10     3. OSA on CPAP  G47.33    Z99.89     4. Type 2 diabetes mellitus with hyperglycemia, without long-term current use of insulin (HCC)  E11.65     5. Type 2 diabetes mellitus with stage 4 chronic kidney disease, without long-term current use of insulin (HCC)  E11.22    N18.4     6. Mixed hyperlipidemia  E78.2     7. Smoking  F17.200     8. Tobacco abuse  Z72.0        RECOMMENDATIONS: Piotr Christopher. is a 71 y.o. male whose past medical history and cardiovascular risk factors include: Diabetes mellitus type 2, FSGS, carotid artery stenosis, GERD, hyperlipidemia, hypertension, nonsecretory pituitary macroadenoma, rheumatoid arthritis, bronchitis, active tobacco use, advanced age, obesity due to excess calories.  Carotid artery stenosis, asymptomatic, bilateral Asymptomatic. Most recent carotid duplex from January 2023 reviewed with the patient at today's office visit.  Right ICA disease remains a stable and left ICA disease has improved when compared to before. Patient is also reduced his smoking to less than half a pack per day. Continue aspirin and statin therapy.  Benign hypertension Improved since establishing care. Currently being managed by nephrology Dr. Santiago Bumpers. Patient will be disenrolled from principal care management and he is not checking the blood pressures at home. However, I have encouraged him to still check blood pressures using his own cuff and provide the numbers to his nephrologist so his medications can further be titrated. Educated on the importance of a low-salt diet and  avoiding nephrotoxic agents. Outside labs independently reviewed from October 2022 and noted above for further reference. Patient is compliant with his CPAP on a daily basis  OSA on CPAP Reemphasized the importance of compliance.  Type 2 diabetes mellitus with hyperglycemia and CKD4, without long-term current use of insulin (HCC) Diabetes currently managed by endocrinology, per patient. Also follows with nephrology given his progressive CKD. Medications reconciled.  Mixed hyperlipidemia Currently on pravastatin.   He denies myalgia or other side effects.  Smoking Tobacco cessation counseling: Currently smoking 0.5 packs/day   Patient was informed of the dangers of tobacco abuse including stroke, cancer, and MI, as well as benefits of tobacco cessation. Patient is not willing to quit at this time. 5 mins were spent counseling patient cessation techniques. We discussed various methods to help quit smoking, including deciding on a date to quit, joining a support group, pharmacological agents- nicotine gum/patch/lozenges.  I will reassess his progress at the next follow-up visit   Records reviewed included office note (nephrology Oct 2022, independently reviewed echo and carotid studies. These findings have been summarized and noted above for further reference.  Discussed disease management, ordering diagnostic testing, coordination of care and patient education provided as a part of today's encounter. Recommend getting yearly physical w/ PCP and have his labs sent to office for reference (including lipids).   FINAL MEDICATION LIST END OF ENCOUNTER:   Current Outpatient Medications:    acetaminophen (TYLENOL) 650 MG CR tablet, Take 1,300 mg by mouth every 8 (eight) hours as needed for pain., Disp: , Rfl:    albuterol (PROVENTIL HFA;VENTOLIN HFA) 108 (90 Base) MCG/ACT inhaler, Inhale 1-2 puffs into the lungs every 6 (six) hours as needed for wheezing or shortness of breath., Disp: , Rfl:     aspirin EC 81 MG tablet, Take 81 mg by mouth daily. , Disp: , Rfl:    cetirizine (ZYRTEC) 10 MG tablet, Take 10 mg by mouth daily., Disp: , Rfl:    cyclobenzaprine (FLEXERIL) 10 MG tablet, Take 10 mg by mouth at bedtime., Disp: , Rfl:    diclofenac Sodium (VOLTAREN) 1 % GEL, Apply 2 g topically 4 (four) times daily as needed (pain). , Disp: , Rfl:    doxazosin (CARDURA) 2 MG tablet, Take 2 mg by mouth 2 (two) times daily. , Disp: , Rfl:    EPINEPHrine 0.3 mg/0.3 mL IJ SOAJ injection, Inject 0.3 mLs (0.3 mg total) into the muscle as needed for up to 2 doses. (Patient taking differently: Inject 0.3 mg into the muscle as needed for anaphylaxis.), Disp: 2 Device, Rfl: 0   escitalopram (LEXAPRO) 10 MG tablet, Take 10 mg by mouth daily., Disp: , Rfl:    ferrous sulfate 325 (65 FE) MG tablet, Take 325 mg by mouth daily., Disp: , Rfl:    furosemide (LASIX) 80 MG tablet, Take 80 mg by mouth daily., Disp: , Rfl:    isosorbide dinitrate (ISORDIL) 30 MG tablet, TAKE 2 TABLETS (60 MG TOTAL) BY MOUTH 3 (THREE) TIMES DAILY., Disp: 180 tablet, Rfl: 2   labetalol (NORMODYNE) 200 MG tablet, TAKE 1 TABLET BY MOUTH TWICE A DAY, Disp: 180 tablet, Rfl: 0   leflunomide (ARAVA) 20 MG tablet, Take 20 mg by mouth daily., Disp: , Rfl:    levothyroxine (SYNTHROID, LEVOTHROID) 200 MCG tablet, Take 200 mcg by mouth daily before breakfast. , Disp: , Rfl:    montelukast (SINGULAIR) 10 MG tablet, Take 10 mg by mouth at bedtime., Disp: , Rfl:  NIFEdipine (PROCARDIA XL/NIFEDICAL XL) 60 MG 24 hr tablet, TAKE 1 TABLET BY MOUTH EVERY DAY, Disp: 90 tablet, Rfl: 1   ONETOUCH VERIO test strip, 3 (three) times daily., Disp: , Rfl:    pantoprazole (PROTONIX) 20 MG tablet, Take 20 mg by mouth daily., Disp: , Rfl:    pravastatin (PRAVACHOL) 40 MG tablet, Take 40 mg by mouth daily. , Disp: , Rfl:    predniSONE (DELTASONE) 5 MG tablet, Take 5 mg by mouth daily as needed (arthritis). , Disp: , Rfl:    spironolactone (ALDACTONE) 25 MG tablet,  Take 25 mg by mouth daily., Disp: , Rfl:    telmisartan (MICARDIS) 20 MG tablet, Take 20 mg by mouth daily. , Disp: , Rfl:    TRADJENTA 5 MG TABS tablet, Take 5 mg by mouth daily., Disp: , Rfl:   No orders of the defined types were placed in this encounter.   --Continue cardiac medications as reconciled in final medication list. --Return in about 6 months (around 07/08/2022) for Follow up carotid artery disease . Or sooner if needed. --Continue follow-up with your primary care physician regarding the management of your other chronic comorbid conditions.  Patient's questions and concerns were addressed to his satisfaction. He voices understanding of the instructions provided during this encounter.   This note was created using a voice recognition software as a result there may be grammatical errors inadvertently enclosed that do not reflect the nature of this encounter. Every attempt is made to correct such errors.  Total time spent: 32 minutes.   Rex Kras, [place], [facility]  Pager: [phone] Office: [phone]

## 2022-01-08 NOTE — Progress Notes (Signed)
Called pt no answer, left a vm

## 2022-01-23 DIAGNOSIS — L821 Other seborrheic keratosis: Secondary | ICD-10-CM | POA: Diagnosis not present

## 2022-01-23 DIAGNOSIS — I788 Other diseases of capillaries: Secondary | ICD-10-CM | POA: Diagnosis not present

## 2022-01-23 DIAGNOSIS — L853 Xerosis cutis: Secondary | ICD-10-CM | POA: Diagnosis not present

## 2022-01-23 DIAGNOSIS — L570 Actinic keratosis: Secondary | ICD-10-CM | POA: Diagnosis not present

## 2022-01-23 DIAGNOSIS — Z85828 Personal history of other malignant neoplasm of skin: Secondary | ICD-10-CM | POA: Diagnosis not present

## 2022-01-29 DIAGNOSIS — N184 Chronic kidney disease, stage 4 (severe): Secondary | ICD-10-CM | POA: Diagnosis not present

## 2022-02-06 ENCOUNTER — Other Ambulatory Visit: Payer: Self-pay | Admitting: Cardiology

## 2022-02-06 DIAGNOSIS — I10 Essential (primary) hypertension: Secondary | ICD-10-CM

## 2022-02-07 DIAGNOSIS — E7800 Pure hypercholesterolemia, unspecified: Secondary | ICD-10-CM | POA: Diagnosis not present

## 2022-02-07 DIAGNOSIS — Z125 Encounter for screening for malignant neoplasm of prostate: Secondary | ICD-10-CM | POA: Diagnosis not present

## 2022-02-07 DIAGNOSIS — I10 Essential (primary) hypertension: Secondary | ICD-10-CM | POA: Diagnosis not present

## 2022-02-07 DIAGNOSIS — D631 Anemia in chronic kidney disease: Secondary | ICD-10-CM | POA: Diagnosis not present

## 2022-02-07 DIAGNOSIS — E118 Type 2 diabetes mellitus with unspecified complications: Secondary | ICD-10-CM | POA: Diagnosis not present

## 2022-02-07 DIAGNOSIS — E890 Postprocedural hypothyroidism: Secondary | ICD-10-CM | POA: Diagnosis not present

## 2022-02-07 DIAGNOSIS — Z0000 Encounter for general adult medical examination without abnormal findings: Secondary | ICD-10-CM | POA: Diagnosis not present

## 2022-02-09 DIAGNOSIS — I129 Hypertensive chronic kidney disease with stage 1 through stage 4 chronic kidney disease, or unspecified chronic kidney disease: Secondary | ICD-10-CM | POA: Diagnosis not present

## 2022-02-09 DIAGNOSIS — E785 Hyperlipidemia, unspecified: Secondary | ICD-10-CM | POA: Diagnosis not present

## 2022-02-09 DIAGNOSIS — D631 Anemia in chronic kidney disease: Secondary | ICD-10-CM | POA: Diagnosis not present

## 2022-02-09 DIAGNOSIS — N2581 Secondary hyperparathyroidism of renal origin: Secondary | ICD-10-CM | POA: Diagnosis not present

## 2022-02-09 DIAGNOSIS — E1122 Type 2 diabetes mellitus with diabetic chronic kidney disease: Secondary | ICD-10-CM | POA: Diagnosis not present

## 2022-02-09 DIAGNOSIS — N184 Chronic kidney disease, stage 4 (severe): Secondary | ICD-10-CM | POA: Diagnosis not present

## 2022-02-09 DIAGNOSIS — E039 Hypothyroidism, unspecified: Secondary | ICD-10-CM | POA: Diagnosis not present

## 2022-02-14 DIAGNOSIS — E118 Type 2 diabetes mellitus with unspecified complications: Secondary | ICD-10-CM | POA: Diagnosis not present

## 2022-02-14 DIAGNOSIS — Z0000 Encounter for general adult medical examination without abnormal findings: Secondary | ICD-10-CM | POA: Diagnosis not present

## 2022-02-14 DIAGNOSIS — I10 Essential (primary) hypertension: Secondary | ICD-10-CM | POA: Diagnosis not present

## 2022-02-14 DIAGNOSIS — R0981 Nasal congestion: Secondary | ICD-10-CM | POA: Diagnosis not present

## 2022-02-14 DIAGNOSIS — R051 Acute cough: Secondary | ICD-10-CM | POA: Diagnosis not present

## 2022-02-14 DIAGNOSIS — N400 Enlarged prostate without lower urinary tract symptoms: Secondary | ICD-10-CM | POA: Diagnosis not present

## 2022-02-14 DIAGNOSIS — N185 Chronic kidney disease, stage 5: Secondary | ICD-10-CM | POA: Diagnosis not present

## 2022-02-14 DIAGNOSIS — D631 Anemia in chronic kidney disease: Secondary | ICD-10-CM | POA: Diagnosis not present

## 2022-02-14 DIAGNOSIS — E7800 Pure hypercholesterolemia, unspecified: Secondary | ICD-10-CM | POA: Diagnosis not present

## 2022-02-14 DIAGNOSIS — E890 Postprocedural hypothyroidism: Secondary | ICD-10-CM | POA: Diagnosis not present

## 2022-02-14 DIAGNOSIS — J432 Centrilobular emphysema: Secondary | ICD-10-CM | POA: Diagnosis not present

## 2022-02-14 DIAGNOSIS — F17200 Nicotine dependence, unspecified, uncomplicated: Secondary | ICD-10-CM | POA: Diagnosis not present

## 2022-02-15 DIAGNOSIS — C73 Malignant neoplasm of thyroid gland: Secondary | ICD-10-CM | POA: Diagnosis not present

## 2022-02-15 DIAGNOSIS — N185 Chronic kidney disease, stage 5: Secondary | ICD-10-CM | POA: Diagnosis not present

## 2022-02-15 DIAGNOSIS — I10 Essential (primary) hypertension: Secondary | ICD-10-CM | POA: Diagnosis not present

## 2022-02-15 DIAGNOSIS — Z515 Encounter for palliative care: Secondary | ICD-10-CM | POA: Diagnosis not present

## 2022-02-15 DIAGNOSIS — E1100 Type 2 diabetes mellitus with hyperosmolarity without nonketotic hyperglycemic-hyperosmolar coma (NKHHC): Secondary | ICD-10-CM | POA: Diagnosis not present

## 2022-02-15 DIAGNOSIS — I6521 Occlusion and stenosis of right carotid artery: Secondary | ICD-10-CM | POA: Diagnosis not present

## 2022-02-15 DIAGNOSIS — Z66 Do not resuscitate: Secondary | ICD-10-CM | POA: Diagnosis not present

## 2022-02-15 DIAGNOSIS — D631 Anemia in chronic kidney disease: Secondary | ICD-10-CM | POA: Diagnosis not present

## 2022-02-15 DIAGNOSIS — F419 Anxiety disorder, unspecified: Secondary | ICD-10-CM | POA: Diagnosis not present

## 2022-02-19 DIAGNOSIS — I10 Essential (primary) hypertension: Secondary | ICD-10-CM | POA: Diagnosis not present

## 2022-02-19 DIAGNOSIS — I6521 Occlusion and stenosis of right carotid artery: Secondary | ICD-10-CM | POA: Diagnosis not present

## 2022-02-19 DIAGNOSIS — E1100 Type 2 diabetes mellitus with hyperosmolarity without nonketotic hyperglycemic-hyperosmolar coma (NKHHC): Secondary | ICD-10-CM | POA: Diagnosis not present

## 2022-02-19 DIAGNOSIS — F419 Anxiety disorder, unspecified: Secondary | ICD-10-CM | POA: Diagnosis not present

## 2022-02-19 DIAGNOSIS — D631 Anemia in chronic kidney disease: Secondary | ICD-10-CM | POA: Diagnosis not present

## 2022-02-19 DIAGNOSIS — N185 Chronic kidney disease, stage 5: Secondary | ICD-10-CM | POA: Diagnosis not present

## 2022-02-26 DIAGNOSIS — D631 Anemia in chronic kidney disease: Secondary | ICD-10-CM | POA: Diagnosis not present

## 2022-02-26 DIAGNOSIS — F419 Anxiety disorder, unspecified: Secondary | ICD-10-CM | POA: Diagnosis not present

## 2022-02-26 DIAGNOSIS — I10 Essential (primary) hypertension: Secondary | ICD-10-CM | POA: Diagnosis not present

## 2022-02-26 DIAGNOSIS — N185 Chronic kidney disease, stage 5: Secondary | ICD-10-CM | POA: Diagnosis not present

## 2022-02-26 DIAGNOSIS — I6521 Occlusion and stenosis of right carotid artery: Secondary | ICD-10-CM | POA: Diagnosis not present

## 2022-02-26 DIAGNOSIS — E1100 Type 2 diabetes mellitus with hyperosmolarity without nonketotic hyperglycemic-hyperosmolar coma (NKHHC): Secondary | ICD-10-CM | POA: Diagnosis not present

## 2022-02-27 DIAGNOSIS — I10 Essential (primary) hypertension: Secondary | ICD-10-CM | POA: Diagnosis not present

## 2022-02-27 DIAGNOSIS — E1100 Type 2 diabetes mellitus with hyperosmolarity without nonketotic hyperglycemic-hyperosmolar coma (NKHHC): Secondary | ICD-10-CM | POA: Diagnosis not present

## 2022-02-27 DIAGNOSIS — I6521 Occlusion and stenosis of right carotid artery: Secondary | ICD-10-CM | POA: Diagnosis not present

## 2022-02-27 DIAGNOSIS — N185 Chronic kidney disease, stage 5: Secondary | ICD-10-CM | POA: Diagnosis not present

## 2022-02-27 DIAGNOSIS — D631 Anemia in chronic kidney disease: Secondary | ICD-10-CM | POA: Diagnosis not present

## 2022-02-27 DIAGNOSIS — F419 Anxiety disorder, unspecified: Secondary | ICD-10-CM | POA: Diagnosis not present

## 2022-03-03 DIAGNOSIS — C73 Malignant neoplasm of thyroid gland: Secondary | ICD-10-CM | POA: Diagnosis not present

## 2022-03-03 DIAGNOSIS — F419 Anxiety disorder, unspecified: Secondary | ICD-10-CM | POA: Diagnosis not present

## 2022-03-03 DIAGNOSIS — E1100 Type 2 diabetes mellitus with hyperosmolarity without nonketotic hyperglycemic-hyperosmolar coma (NKHHC): Secondary | ICD-10-CM | POA: Diagnosis not present

## 2022-03-03 DIAGNOSIS — N185 Chronic kidney disease, stage 5: Secondary | ICD-10-CM | POA: Diagnosis not present

## 2022-03-03 DIAGNOSIS — Z66 Do not resuscitate: Secondary | ICD-10-CM | POA: Diagnosis not present

## 2022-03-03 DIAGNOSIS — I6521 Occlusion and stenosis of right carotid artery: Secondary | ICD-10-CM | POA: Diagnosis not present

## 2022-03-03 DIAGNOSIS — D631 Anemia in chronic kidney disease: Secondary | ICD-10-CM | POA: Diagnosis not present

## 2022-03-03 DIAGNOSIS — Z515 Encounter for palliative care: Secondary | ICD-10-CM | POA: Diagnosis not present

## 2022-03-03 DIAGNOSIS — I10 Essential (primary) hypertension: Secondary | ICD-10-CM | POA: Diagnosis not present

## 2022-03-09 DIAGNOSIS — I10 Essential (primary) hypertension: Secondary | ICD-10-CM | POA: Diagnosis not present

## 2022-03-09 DIAGNOSIS — N185 Chronic kidney disease, stage 5: Secondary | ICD-10-CM | POA: Diagnosis not present

## 2022-03-09 DIAGNOSIS — D631 Anemia in chronic kidney disease: Secondary | ICD-10-CM | POA: Diagnosis not present

## 2022-03-09 DIAGNOSIS — I6521 Occlusion and stenosis of right carotid artery: Secondary | ICD-10-CM | POA: Diagnosis not present

## 2022-03-09 DIAGNOSIS — E1100 Type 2 diabetes mellitus with hyperosmolarity without nonketotic hyperglycemic-hyperosmolar coma (NKHHC): Secondary | ICD-10-CM | POA: Diagnosis not present

## 2022-03-09 DIAGNOSIS — F419 Anxiety disorder, unspecified: Secondary | ICD-10-CM | POA: Diagnosis not present

## 2022-03-14 DIAGNOSIS — D631 Anemia in chronic kidney disease: Secondary | ICD-10-CM | POA: Diagnosis not present

## 2022-03-14 DIAGNOSIS — E1100 Type 2 diabetes mellitus with hyperosmolarity without nonketotic hyperglycemic-hyperosmolar coma (NKHHC): Secondary | ICD-10-CM | POA: Diagnosis not present

## 2022-03-14 DIAGNOSIS — I6521 Occlusion and stenosis of right carotid artery: Secondary | ICD-10-CM | POA: Diagnosis not present

## 2022-03-14 DIAGNOSIS — F419 Anxiety disorder, unspecified: Secondary | ICD-10-CM | POA: Diagnosis not present

## 2022-03-14 DIAGNOSIS — I10 Essential (primary) hypertension: Secondary | ICD-10-CM | POA: Diagnosis not present

## 2022-03-14 DIAGNOSIS — N185 Chronic kidney disease, stage 5: Secondary | ICD-10-CM | POA: Diagnosis not present

## 2022-03-20 DIAGNOSIS — I6521 Occlusion and stenosis of right carotid artery: Secondary | ICD-10-CM | POA: Diagnosis not present

## 2022-03-20 DIAGNOSIS — E1100 Type 2 diabetes mellitus with hyperosmolarity without nonketotic hyperglycemic-hyperosmolar coma (NKHHC): Secondary | ICD-10-CM | POA: Diagnosis not present

## 2022-03-20 DIAGNOSIS — D631 Anemia in chronic kidney disease: Secondary | ICD-10-CM | POA: Diagnosis not present

## 2022-03-20 DIAGNOSIS — I10 Essential (primary) hypertension: Secondary | ICD-10-CM | POA: Diagnosis not present

## 2022-03-20 DIAGNOSIS — F419 Anxiety disorder, unspecified: Secondary | ICD-10-CM | POA: Diagnosis not present

## 2022-03-20 DIAGNOSIS — N185 Chronic kidney disease, stage 5: Secondary | ICD-10-CM | POA: Diagnosis not present

## 2022-03-28 DIAGNOSIS — N185 Chronic kidney disease, stage 5: Secondary | ICD-10-CM | POA: Diagnosis not present

## 2022-03-28 DIAGNOSIS — I6521 Occlusion and stenosis of right carotid artery: Secondary | ICD-10-CM | POA: Diagnosis not present

## 2022-03-28 DIAGNOSIS — F419 Anxiety disorder, unspecified: Secondary | ICD-10-CM | POA: Diagnosis not present

## 2022-03-28 DIAGNOSIS — E1100 Type 2 diabetes mellitus with hyperosmolarity without nonketotic hyperglycemic-hyperosmolar coma (NKHHC): Secondary | ICD-10-CM | POA: Diagnosis not present

## 2022-03-28 DIAGNOSIS — D631 Anemia in chronic kidney disease: Secondary | ICD-10-CM | POA: Diagnosis not present

## 2022-03-28 DIAGNOSIS — I10 Essential (primary) hypertension: Secondary | ICD-10-CM | POA: Diagnosis not present

## 2022-04-02 DIAGNOSIS — E1100 Type 2 diabetes mellitus with hyperosmolarity without nonketotic hyperglycemic-hyperosmolar coma (NKHHC): Secondary | ICD-10-CM | POA: Diagnosis not present

## 2022-04-02 DIAGNOSIS — D631 Anemia in chronic kidney disease: Secondary | ICD-10-CM | POA: Diagnosis not present

## 2022-04-02 DIAGNOSIS — C73 Malignant neoplasm of thyroid gland: Secondary | ICD-10-CM | POA: Diagnosis not present

## 2022-04-02 DIAGNOSIS — I6521 Occlusion and stenosis of right carotid artery: Secondary | ICD-10-CM | POA: Diagnosis not present

## 2022-04-02 DIAGNOSIS — F419 Anxiety disorder, unspecified: Secondary | ICD-10-CM | POA: Diagnosis not present

## 2022-04-02 DIAGNOSIS — Z515 Encounter for palliative care: Secondary | ICD-10-CM | POA: Diagnosis not present

## 2022-04-02 DIAGNOSIS — Z66 Do not resuscitate: Secondary | ICD-10-CM | POA: Diagnosis not present

## 2022-04-02 DIAGNOSIS — N185 Chronic kidney disease, stage 5: Secondary | ICD-10-CM | POA: Diagnosis not present

## 2022-04-02 DIAGNOSIS — I10 Essential (primary) hypertension: Secondary | ICD-10-CM | POA: Diagnosis not present

## 2022-04-04 DIAGNOSIS — E1100 Type 2 diabetes mellitus with hyperosmolarity without nonketotic hyperglycemic-hyperosmolar coma (NKHHC): Secondary | ICD-10-CM | POA: Diagnosis not present

## 2022-04-04 DIAGNOSIS — D631 Anemia in chronic kidney disease: Secondary | ICD-10-CM | POA: Diagnosis not present

## 2022-04-04 DIAGNOSIS — I6521 Occlusion and stenosis of right carotid artery: Secondary | ICD-10-CM | POA: Diagnosis not present

## 2022-04-04 DIAGNOSIS — I10 Essential (primary) hypertension: Secondary | ICD-10-CM | POA: Diagnosis not present

## 2022-04-04 DIAGNOSIS — F419 Anxiety disorder, unspecified: Secondary | ICD-10-CM | POA: Diagnosis not present

## 2022-04-04 DIAGNOSIS — N185 Chronic kidney disease, stage 5: Secondary | ICD-10-CM | POA: Diagnosis not present

## 2022-04-10 DIAGNOSIS — N184 Chronic kidney disease, stage 4 (severe): Secondary | ICD-10-CM | POA: Diagnosis not present

## 2022-04-11 DIAGNOSIS — D631 Anemia in chronic kidney disease: Secondary | ICD-10-CM | POA: Diagnosis not present

## 2022-04-11 DIAGNOSIS — F419 Anxiety disorder, unspecified: Secondary | ICD-10-CM | POA: Diagnosis not present

## 2022-04-11 DIAGNOSIS — I6521 Occlusion and stenosis of right carotid artery: Secondary | ICD-10-CM | POA: Diagnosis not present

## 2022-04-11 DIAGNOSIS — E1100 Type 2 diabetes mellitus with hyperosmolarity without nonketotic hyperglycemic-hyperosmolar coma (NKHHC): Secondary | ICD-10-CM | POA: Diagnosis not present

## 2022-04-11 DIAGNOSIS — I10 Essential (primary) hypertension: Secondary | ICD-10-CM | POA: Diagnosis not present

## 2022-04-11 DIAGNOSIS — N185 Chronic kidney disease, stage 5: Secondary | ICD-10-CM | POA: Diagnosis not present

## 2022-04-12 DIAGNOSIS — E1100 Type 2 diabetes mellitus with hyperosmolarity without nonketotic hyperglycemic-hyperosmolar coma (NKHHC): Secondary | ICD-10-CM | POA: Diagnosis not present

## 2022-04-12 DIAGNOSIS — I6521 Occlusion and stenosis of right carotid artery: Secondary | ICD-10-CM | POA: Diagnosis not present

## 2022-04-12 DIAGNOSIS — F419 Anxiety disorder, unspecified: Secondary | ICD-10-CM | POA: Diagnosis not present

## 2022-04-12 DIAGNOSIS — I10 Essential (primary) hypertension: Secondary | ICD-10-CM | POA: Diagnosis not present

## 2022-04-12 DIAGNOSIS — N185 Chronic kidney disease, stage 5: Secondary | ICD-10-CM | POA: Diagnosis not present

## 2022-04-12 DIAGNOSIS — D631 Anemia in chronic kidney disease: Secondary | ICD-10-CM | POA: Diagnosis not present

## 2022-04-16 DIAGNOSIS — D631 Anemia in chronic kidney disease: Secondary | ICD-10-CM | POA: Diagnosis not present

## 2022-04-16 DIAGNOSIS — E1100 Type 2 diabetes mellitus with hyperosmolarity without nonketotic hyperglycemic-hyperosmolar coma (NKHHC): Secondary | ICD-10-CM | POA: Diagnosis not present

## 2022-04-16 DIAGNOSIS — N185 Chronic kidney disease, stage 5: Secondary | ICD-10-CM | POA: Diagnosis not present

## 2022-04-16 DIAGNOSIS — I6521 Occlusion and stenosis of right carotid artery: Secondary | ICD-10-CM | POA: Diagnosis not present

## 2022-04-16 DIAGNOSIS — F419 Anxiety disorder, unspecified: Secondary | ICD-10-CM | POA: Diagnosis not present

## 2022-04-16 DIAGNOSIS — I10 Essential (primary) hypertension: Secondary | ICD-10-CM | POA: Diagnosis not present

## 2022-04-17 DIAGNOSIS — D631 Anemia in chronic kidney disease: Secondary | ICD-10-CM | POA: Diagnosis not present

## 2022-04-17 DIAGNOSIS — E1122 Type 2 diabetes mellitus with diabetic chronic kidney disease: Secondary | ICD-10-CM | POA: Diagnosis not present

## 2022-04-17 DIAGNOSIS — E039 Hypothyroidism, unspecified: Secondary | ICD-10-CM | POA: Diagnosis not present

## 2022-04-17 DIAGNOSIS — I129 Hypertensive chronic kidney disease with stage 1 through stage 4 chronic kidney disease, or unspecified chronic kidney disease: Secondary | ICD-10-CM | POA: Diagnosis not present

## 2022-04-17 DIAGNOSIS — N184 Chronic kidney disease, stage 4 (severe): Secondary | ICD-10-CM | POA: Diagnosis not present

## 2022-04-17 DIAGNOSIS — K219 Gastro-esophageal reflux disease without esophagitis: Secondary | ICD-10-CM | POA: Diagnosis not present

## 2022-04-17 DIAGNOSIS — N2581 Secondary hyperparathyroidism of renal origin: Secondary | ICD-10-CM | POA: Diagnosis not present

## 2022-04-18 DIAGNOSIS — I10 Essential (primary) hypertension: Secondary | ICD-10-CM | POA: Diagnosis not present

## 2022-04-18 DIAGNOSIS — E1100 Type 2 diabetes mellitus with hyperosmolarity without nonketotic hyperglycemic-hyperosmolar coma (NKHHC): Secondary | ICD-10-CM | POA: Diagnosis not present

## 2022-04-18 DIAGNOSIS — I6521 Occlusion and stenosis of right carotid artery: Secondary | ICD-10-CM | POA: Diagnosis not present

## 2022-04-18 DIAGNOSIS — N185 Chronic kidney disease, stage 5: Secondary | ICD-10-CM | POA: Diagnosis not present

## 2022-04-18 DIAGNOSIS — F419 Anxiety disorder, unspecified: Secondary | ICD-10-CM | POA: Diagnosis not present

## 2022-04-18 DIAGNOSIS — D631 Anemia in chronic kidney disease: Secondary | ICD-10-CM | POA: Diagnosis not present

## 2022-04-19 DIAGNOSIS — F419 Anxiety disorder, unspecified: Secondary | ICD-10-CM | POA: Diagnosis not present

## 2022-04-19 DIAGNOSIS — I10 Essential (primary) hypertension: Secondary | ICD-10-CM | POA: Diagnosis not present

## 2022-04-19 DIAGNOSIS — D631 Anemia in chronic kidney disease: Secondary | ICD-10-CM | POA: Diagnosis not present

## 2022-04-19 DIAGNOSIS — I6521 Occlusion and stenosis of right carotid artery: Secondary | ICD-10-CM | POA: Diagnosis not present

## 2022-04-19 DIAGNOSIS — N185 Chronic kidney disease, stage 5: Secondary | ICD-10-CM | POA: Diagnosis not present

## 2022-04-19 DIAGNOSIS — E1100 Type 2 diabetes mellitus with hyperosmolarity without nonketotic hyperglycemic-hyperosmolar coma (NKHHC): Secondary | ICD-10-CM | POA: Diagnosis not present

## 2022-04-25 ENCOUNTER — Other Ambulatory Visit: Payer: Self-pay | Admitting: Cardiology

## 2022-04-25 DIAGNOSIS — I10 Essential (primary) hypertension: Secondary | ICD-10-CM | POA: Diagnosis not present

## 2022-04-25 DIAGNOSIS — E1100 Type 2 diabetes mellitus with hyperosmolarity without nonketotic hyperglycemic-hyperosmolar coma (NKHHC): Secondary | ICD-10-CM | POA: Diagnosis not present

## 2022-04-25 DIAGNOSIS — F419 Anxiety disorder, unspecified: Secondary | ICD-10-CM | POA: Diagnosis not present

## 2022-04-25 DIAGNOSIS — D631 Anemia in chronic kidney disease: Secondary | ICD-10-CM | POA: Diagnosis not present

## 2022-04-25 DIAGNOSIS — N185 Chronic kidney disease, stage 5: Secondary | ICD-10-CM | POA: Diagnosis not present

## 2022-04-25 DIAGNOSIS — I6521 Occlusion and stenosis of right carotid artery: Secondary | ICD-10-CM | POA: Diagnosis not present

## 2022-05-02 DIAGNOSIS — D631 Anemia in chronic kidney disease: Secondary | ICD-10-CM | POA: Diagnosis not present

## 2022-05-02 DIAGNOSIS — I6521 Occlusion and stenosis of right carotid artery: Secondary | ICD-10-CM | POA: Diagnosis not present

## 2022-05-02 DIAGNOSIS — I10 Essential (primary) hypertension: Secondary | ICD-10-CM | POA: Diagnosis not present

## 2022-05-02 DIAGNOSIS — N185 Chronic kidney disease, stage 5: Secondary | ICD-10-CM | POA: Diagnosis not present

## 2022-05-02 DIAGNOSIS — E1100 Type 2 diabetes mellitus with hyperosmolarity without nonketotic hyperglycemic-hyperosmolar coma (NKHHC): Secondary | ICD-10-CM | POA: Diagnosis not present

## 2022-05-02 DIAGNOSIS — F419 Anxiety disorder, unspecified: Secondary | ICD-10-CM | POA: Diagnosis not present

## 2022-05-03 DIAGNOSIS — I10 Essential (primary) hypertension: Secondary | ICD-10-CM | POA: Diagnosis not present

## 2022-05-03 DIAGNOSIS — N185 Chronic kidney disease, stage 5: Secondary | ICD-10-CM | POA: Diagnosis not present

## 2022-05-03 DIAGNOSIS — D631 Anemia in chronic kidney disease: Secondary | ICD-10-CM | POA: Diagnosis not present

## 2022-05-03 DIAGNOSIS — F419 Anxiety disorder, unspecified: Secondary | ICD-10-CM | POA: Diagnosis not present

## 2022-05-03 DIAGNOSIS — Z66 Do not resuscitate: Secondary | ICD-10-CM | POA: Diagnosis not present

## 2022-05-03 DIAGNOSIS — C73 Malignant neoplasm of thyroid gland: Secondary | ICD-10-CM | POA: Diagnosis not present

## 2022-05-03 DIAGNOSIS — I6521 Occlusion and stenosis of right carotid artery: Secondary | ICD-10-CM | POA: Diagnosis not present

## 2022-05-03 DIAGNOSIS — E1100 Type 2 diabetes mellitus with hyperosmolarity without nonketotic hyperglycemic-hyperosmolar coma (NKHHC): Secondary | ICD-10-CM | POA: Diagnosis not present

## 2022-05-03 DIAGNOSIS — Z515 Encounter for palliative care: Secondary | ICD-10-CM | POA: Diagnosis not present

## 2022-05-07 DIAGNOSIS — E1100 Type 2 diabetes mellitus with hyperosmolarity without nonketotic hyperglycemic-hyperosmolar coma (NKHHC): Secondary | ICD-10-CM | POA: Diagnosis not present

## 2022-05-07 DIAGNOSIS — I6521 Occlusion and stenosis of right carotid artery: Secondary | ICD-10-CM | POA: Diagnosis not present

## 2022-05-07 DIAGNOSIS — M545 Low back pain, unspecified: Secondary | ICD-10-CM | POA: Diagnosis not present

## 2022-05-07 DIAGNOSIS — W19XXXA Unspecified fall, initial encounter: Secondary | ICD-10-CM | POA: Diagnosis not present

## 2022-05-07 DIAGNOSIS — N185 Chronic kidney disease, stage 5: Secondary | ICD-10-CM | POA: Diagnosis not present

## 2022-05-07 DIAGNOSIS — I10 Essential (primary) hypertension: Secondary | ICD-10-CM | POA: Diagnosis not present

## 2022-05-07 DIAGNOSIS — F5101 Primary insomnia: Secondary | ICD-10-CM | POA: Diagnosis not present

## 2022-05-07 DIAGNOSIS — M2550 Pain in unspecified joint: Secondary | ICD-10-CM | POA: Diagnosis not present

## 2022-05-07 DIAGNOSIS — D631 Anemia in chronic kidney disease: Secondary | ICD-10-CM | POA: Diagnosis not present

## 2022-05-07 DIAGNOSIS — F419 Anxiety disorder, unspecified: Secondary | ICD-10-CM | POA: Diagnosis not present

## 2022-05-08 ENCOUNTER — Other Ambulatory Visit: Payer: Self-pay | Admitting: Cardiology

## 2022-05-08 DIAGNOSIS — I10 Essential (primary) hypertension: Secondary | ICD-10-CM

## 2022-05-09 DIAGNOSIS — I6521 Occlusion and stenosis of right carotid artery: Secondary | ICD-10-CM | POA: Diagnosis not present

## 2022-05-09 DIAGNOSIS — I10 Essential (primary) hypertension: Secondary | ICD-10-CM | POA: Diagnosis not present

## 2022-05-09 DIAGNOSIS — E1100 Type 2 diabetes mellitus with hyperosmolarity without nonketotic hyperglycemic-hyperosmolar coma (NKHHC): Secondary | ICD-10-CM | POA: Diagnosis not present

## 2022-05-09 DIAGNOSIS — F419 Anxiety disorder, unspecified: Secondary | ICD-10-CM | POA: Diagnosis not present

## 2022-05-09 DIAGNOSIS — N185 Chronic kidney disease, stage 5: Secondary | ICD-10-CM | POA: Diagnosis not present

## 2022-05-09 DIAGNOSIS — D631 Anemia in chronic kidney disease: Secondary | ICD-10-CM | POA: Diagnosis not present

## 2022-05-16 DIAGNOSIS — I6521 Occlusion and stenosis of right carotid artery: Secondary | ICD-10-CM | POA: Diagnosis not present

## 2022-05-16 DIAGNOSIS — I10 Essential (primary) hypertension: Secondary | ICD-10-CM | POA: Diagnosis not present

## 2022-05-16 DIAGNOSIS — F419 Anxiety disorder, unspecified: Secondary | ICD-10-CM | POA: Diagnosis not present

## 2022-05-16 DIAGNOSIS — D631 Anemia in chronic kidney disease: Secondary | ICD-10-CM | POA: Diagnosis not present

## 2022-05-16 DIAGNOSIS — N185 Chronic kidney disease, stage 5: Secondary | ICD-10-CM | POA: Diagnosis not present

## 2022-05-16 DIAGNOSIS — E1100 Type 2 diabetes mellitus with hyperosmolarity without nonketotic hyperglycemic-hyperosmolar coma (NKHHC): Secondary | ICD-10-CM | POA: Diagnosis not present

## 2022-05-17 DIAGNOSIS — F419 Anxiety disorder, unspecified: Secondary | ICD-10-CM | POA: Diagnosis not present

## 2022-05-17 DIAGNOSIS — D631 Anemia in chronic kidney disease: Secondary | ICD-10-CM | POA: Diagnosis not present

## 2022-05-17 DIAGNOSIS — N185 Chronic kidney disease, stage 5: Secondary | ICD-10-CM | POA: Diagnosis not present

## 2022-05-17 DIAGNOSIS — I10 Essential (primary) hypertension: Secondary | ICD-10-CM | POA: Diagnosis not present

## 2022-05-17 DIAGNOSIS — I6521 Occlusion and stenosis of right carotid artery: Secondary | ICD-10-CM | POA: Diagnosis not present

## 2022-05-17 DIAGNOSIS — E1100 Type 2 diabetes mellitus with hyperosmolarity without nonketotic hyperglycemic-hyperosmolar coma (NKHHC): Secondary | ICD-10-CM | POA: Diagnosis not present

## 2022-05-21 ENCOUNTER — Other Ambulatory Visit: Payer: Self-pay | Admitting: Cardiology

## 2022-05-21 DIAGNOSIS — I10 Essential (primary) hypertension: Secondary | ICD-10-CM

## 2022-05-23 DIAGNOSIS — E1100 Type 2 diabetes mellitus with hyperosmolarity without nonketotic hyperglycemic-hyperosmolar coma (NKHHC): Secondary | ICD-10-CM | POA: Diagnosis not present

## 2022-05-23 DIAGNOSIS — I10 Essential (primary) hypertension: Secondary | ICD-10-CM | POA: Diagnosis not present

## 2022-05-23 DIAGNOSIS — D631 Anemia in chronic kidney disease: Secondary | ICD-10-CM | POA: Diagnosis not present

## 2022-05-23 DIAGNOSIS — F419 Anxiety disorder, unspecified: Secondary | ICD-10-CM | POA: Diagnosis not present

## 2022-05-23 DIAGNOSIS — N185 Chronic kidney disease, stage 5: Secondary | ICD-10-CM | POA: Diagnosis not present

## 2022-05-23 DIAGNOSIS — I6521 Occlusion and stenosis of right carotid artery: Secondary | ICD-10-CM | POA: Diagnosis not present

## 2022-05-29 ENCOUNTER — Other Ambulatory Visit: Payer: Self-pay | Admitting: Cardiology

## 2022-05-29 DIAGNOSIS — I10 Essential (primary) hypertension: Secondary | ICD-10-CM

## 2022-05-30 DIAGNOSIS — I6521 Occlusion and stenosis of right carotid artery: Secondary | ICD-10-CM | POA: Diagnosis not present

## 2022-05-30 DIAGNOSIS — I10 Essential (primary) hypertension: Secondary | ICD-10-CM | POA: Diagnosis not present

## 2022-05-30 DIAGNOSIS — E1100 Type 2 diabetes mellitus with hyperosmolarity without nonketotic hyperglycemic-hyperosmolar coma (NKHHC): Secondary | ICD-10-CM | POA: Diagnosis not present

## 2022-05-30 DIAGNOSIS — D631 Anemia in chronic kidney disease: Secondary | ICD-10-CM | POA: Diagnosis not present

## 2022-05-30 DIAGNOSIS — N185 Chronic kidney disease, stage 5: Secondary | ICD-10-CM | POA: Diagnosis not present

## 2022-05-30 DIAGNOSIS — F419 Anxiety disorder, unspecified: Secondary | ICD-10-CM | POA: Diagnosis not present

## 2022-06-02 DIAGNOSIS — Z66 Do not resuscitate: Secondary | ICD-10-CM | POA: Diagnosis not present

## 2022-06-02 DIAGNOSIS — F419 Anxiety disorder, unspecified: Secondary | ICD-10-CM | POA: Diagnosis not present

## 2022-06-02 DIAGNOSIS — I10 Essential (primary) hypertension: Secondary | ICD-10-CM | POA: Diagnosis not present

## 2022-06-02 DIAGNOSIS — D631 Anemia in chronic kidney disease: Secondary | ICD-10-CM | POA: Diagnosis not present

## 2022-06-02 DIAGNOSIS — I6521 Occlusion and stenosis of right carotid artery: Secondary | ICD-10-CM | POA: Diagnosis not present

## 2022-06-02 DIAGNOSIS — Z515 Encounter for palliative care: Secondary | ICD-10-CM | POA: Diagnosis not present

## 2022-06-02 DIAGNOSIS — E1100 Type 2 diabetes mellitus with hyperosmolarity without nonketotic hyperglycemic-hyperosmolar coma (NKHHC): Secondary | ICD-10-CM | POA: Diagnosis not present

## 2022-06-02 DIAGNOSIS — N185 Chronic kidney disease, stage 5: Secondary | ICD-10-CM | POA: Diagnosis not present

## 2022-06-02 DIAGNOSIS — C73 Malignant neoplasm of thyroid gland: Secondary | ICD-10-CM | POA: Diagnosis not present

## 2022-06-06 DIAGNOSIS — I6521 Occlusion and stenosis of right carotid artery: Secondary | ICD-10-CM | POA: Diagnosis not present

## 2022-06-06 DIAGNOSIS — E1100 Type 2 diabetes mellitus with hyperosmolarity without nonketotic hyperglycemic-hyperosmolar coma (NKHHC): Secondary | ICD-10-CM | POA: Diagnosis not present

## 2022-06-06 DIAGNOSIS — F419 Anxiety disorder, unspecified: Secondary | ICD-10-CM | POA: Diagnosis not present

## 2022-06-06 DIAGNOSIS — D631 Anemia in chronic kidney disease: Secondary | ICD-10-CM | POA: Diagnosis not present

## 2022-06-06 DIAGNOSIS — I10 Essential (primary) hypertension: Secondary | ICD-10-CM | POA: Diagnosis not present

## 2022-06-06 DIAGNOSIS — N185 Chronic kidney disease, stage 5: Secondary | ICD-10-CM | POA: Diagnosis not present

## 2022-06-11 ENCOUNTER — Other Ambulatory Visit: Payer: Self-pay | Admitting: Cardiology

## 2022-06-11 DIAGNOSIS — I10 Essential (primary) hypertension: Secondary | ICD-10-CM

## 2022-06-13 DIAGNOSIS — N185 Chronic kidney disease, stage 5: Secondary | ICD-10-CM | POA: Diagnosis not present

## 2022-06-13 DIAGNOSIS — D631 Anemia in chronic kidney disease: Secondary | ICD-10-CM | POA: Diagnosis not present

## 2022-06-13 DIAGNOSIS — E1100 Type 2 diabetes mellitus with hyperosmolarity without nonketotic hyperglycemic-hyperosmolar coma (NKHHC): Secondary | ICD-10-CM | POA: Diagnosis not present

## 2022-06-13 DIAGNOSIS — I10 Essential (primary) hypertension: Secondary | ICD-10-CM | POA: Diagnosis not present

## 2022-06-13 DIAGNOSIS — F419 Anxiety disorder, unspecified: Secondary | ICD-10-CM | POA: Diagnosis not present

## 2022-06-13 DIAGNOSIS — I6521 Occlusion and stenosis of right carotid artery: Secondary | ICD-10-CM | POA: Diagnosis not present

## 2022-06-17 DIAGNOSIS — E1100 Type 2 diabetes mellitus with hyperosmolarity without nonketotic hyperglycemic-hyperosmolar coma (NKHHC): Secondary | ICD-10-CM | POA: Diagnosis not present

## 2022-06-17 DIAGNOSIS — I10 Essential (primary) hypertension: Secondary | ICD-10-CM | POA: Diagnosis not present

## 2022-06-17 DIAGNOSIS — D631 Anemia in chronic kidney disease: Secondary | ICD-10-CM | POA: Diagnosis not present

## 2022-06-17 DIAGNOSIS — I6521 Occlusion and stenosis of right carotid artery: Secondary | ICD-10-CM | POA: Diagnosis not present

## 2022-06-17 DIAGNOSIS — N185 Chronic kidney disease, stage 5: Secondary | ICD-10-CM | POA: Diagnosis not present

## 2022-06-17 DIAGNOSIS — F419 Anxiety disorder, unspecified: Secondary | ICD-10-CM | POA: Diagnosis not present

## 2022-06-20 DIAGNOSIS — I10 Essential (primary) hypertension: Secondary | ICD-10-CM | POA: Diagnosis not present

## 2022-06-20 DIAGNOSIS — D631 Anemia in chronic kidney disease: Secondary | ICD-10-CM | POA: Diagnosis not present

## 2022-06-20 DIAGNOSIS — F419 Anxiety disorder, unspecified: Secondary | ICD-10-CM | POA: Diagnosis not present

## 2022-06-20 DIAGNOSIS — I6521 Occlusion and stenosis of right carotid artery: Secondary | ICD-10-CM | POA: Diagnosis not present

## 2022-06-20 DIAGNOSIS — E1100 Type 2 diabetes mellitus with hyperosmolarity without nonketotic hyperglycemic-hyperosmolar coma (NKHHC): Secondary | ICD-10-CM | POA: Diagnosis not present

## 2022-06-20 DIAGNOSIS — N185 Chronic kidney disease, stage 5: Secondary | ICD-10-CM | POA: Diagnosis not present

## 2022-06-27 DIAGNOSIS — I10 Essential (primary) hypertension: Secondary | ICD-10-CM | POA: Diagnosis not present

## 2022-06-27 DIAGNOSIS — I6521 Occlusion and stenosis of right carotid artery: Secondary | ICD-10-CM | POA: Diagnosis not present

## 2022-06-27 DIAGNOSIS — F419 Anxiety disorder, unspecified: Secondary | ICD-10-CM | POA: Diagnosis not present

## 2022-06-27 DIAGNOSIS — E1100 Type 2 diabetes mellitus with hyperosmolarity without nonketotic hyperglycemic-hyperosmolar coma (NKHHC): Secondary | ICD-10-CM | POA: Diagnosis not present

## 2022-06-27 DIAGNOSIS — N185 Chronic kidney disease, stage 5: Secondary | ICD-10-CM | POA: Diagnosis not present

## 2022-06-27 DIAGNOSIS — D631 Anemia in chronic kidney disease: Secondary | ICD-10-CM | POA: Diagnosis not present

## 2022-06-29 DIAGNOSIS — D631 Anemia in chronic kidney disease: Secondary | ICD-10-CM | POA: Diagnosis not present

## 2022-06-29 DIAGNOSIS — E1100 Type 2 diabetes mellitus with hyperosmolarity without nonketotic hyperglycemic-hyperosmolar coma (NKHHC): Secondary | ICD-10-CM | POA: Diagnosis not present

## 2022-06-29 DIAGNOSIS — F419 Anxiety disorder, unspecified: Secondary | ICD-10-CM | POA: Diagnosis not present

## 2022-06-29 DIAGNOSIS — I6521 Occlusion and stenosis of right carotid artery: Secondary | ICD-10-CM | POA: Diagnosis not present

## 2022-06-29 DIAGNOSIS — I10 Essential (primary) hypertension: Secondary | ICD-10-CM | POA: Diagnosis not present

## 2022-06-29 DIAGNOSIS — N185 Chronic kidney disease, stage 5: Secondary | ICD-10-CM | POA: Diagnosis not present

## 2022-07-03 DIAGNOSIS — Z515 Encounter for palliative care: Secondary | ICD-10-CM | POA: Diagnosis not present

## 2022-07-03 DIAGNOSIS — E1100 Type 2 diabetes mellitus with hyperosmolarity without nonketotic hyperglycemic-hyperosmolar coma (NKHHC): Secondary | ICD-10-CM | POA: Diagnosis not present

## 2022-07-03 DIAGNOSIS — D631 Anemia in chronic kidney disease: Secondary | ICD-10-CM | POA: Diagnosis not present

## 2022-07-03 DIAGNOSIS — Z66 Do not resuscitate: Secondary | ICD-10-CM | POA: Diagnosis not present

## 2022-07-03 DIAGNOSIS — N185 Chronic kidney disease, stage 5: Secondary | ICD-10-CM | POA: Diagnosis not present

## 2022-07-03 DIAGNOSIS — F419 Anxiety disorder, unspecified: Secondary | ICD-10-CM | POA: Diagnosis not present

## 2022-07-03 DIAGNOSIS — I10 Essential (primary) hypertension: Secondary | ICD-10-CM | POA: Diagnosis not present

## 2022-07-03 DIAGNOSIS — C73 Malignant neoplasm of thyroid gland: Secondary | ICD-10-CM | POA: Diagnosis not present

## 2022-07-03 DIAGNOSIS — I6521 Occlusion and stenosis of right carotid artery: Secondary | ICD-10-CM | POA: Diagnosis not present

## 2022-07-04 DIAGNOSIS — I10 Essential (primary) hypertension: Secondary | ICD-10-CM | POA: Diagnosis not present

## 2022-07-04 DIAGNOSIS — N185 Chronic kidney disease, stage 5: Secondary | ICD-10-CM | POA: Diagnosis not present

## 2022-07-04 DIAGNOSIS — F419 Anxiety disorder, unspecified: Secondary | ICD-10-CM | POA: Diagnosis not present

## 2022-07-04 DIAGNOSIS — E1100 Type 2 diabetes mellitus with hyperosmolarity without nonketotic hyperglycemic-hyperosmolar coma (NKHHC): Secondary | ICD-10-CM | POA: Diagnosis not present

## 2022-07-04 DIAGNOSIS — I6521 Occlusion and stenosis of right carotid artery: Secondary | ICD-10-CM | POA: Diagnosis not present

## 2022-07-04 DIAGNOSIS — D631 Anemia in chronic kidney disease: Secondary | ICD-10-CM | POA: Diagnosis not present

## 2022-07-09 DIAGNOSIS — N184 Chronic kidney disease, stage 4 (severe): Secondary | ICD-10-CM | POA: Diagnosis not present

## 2022-07-11 ENCOUNTER — Encounter: Payer: Self-pay | Admitting: Cardiology

## 2022-07-11 ENCOUNTER — Ambulatory Visit: Payer: Medicare Other | Admitting: Cardiology

## 2022-07-11 ENCOUNTER — Other Ambulatory Visit: Payer: Self-pay | Admitting: Cardiology

## 2022-07-11 VITALS — BP 112/52 | HR 61 | Temp 98.0°F | Resp 17 | Ht 68.0 in | Wt 246.0 lb

## 2022-07-11 DIAGNOSIS — E1122 Type 2 diabetes mellitus with diabetic chronic kidney disease: Secondary | ICD-10-CM | POA: Diagnosis not present

## 2022-07-11 DIAGNOSIS — N185 Chronic kidney disease, stage 5: Secondary | ICD-10-CM | POA: Diagnosis not present

## 2022-07-11 DIAGNOSIS — Z9989 Dependence on other enabling machines and devices: Secondary | ICD-10-CM | POA: Diagnosis not present

## 2022-07-11 DIAGNOSIS — E1165 Type 2 diabetes mellitus with hyperglycemia: Secondary | ICD-10-CM

## 2022-07-11 DIAGNOSIS — E782 Mixed hyperlipidemia: Secondary | ICD-10-CM

## 2022-07-11 DIAGNOSIS — G4733 Obstructive sleep apnea (adult) (pediatric): Secondary | ICD-10-CM | POA: Diagnosis not present

## 2022-07-11 DIAGNOSIS — I6523 Occlusion and stenosis of bilateral carotid arteries: Secondary | ICD-10-CM | POA: Diagnosis not present

## 2022-07-11 DIAGNOSIS — I10 Essential (primary) hypertension: Secondary | ICD-10-CM

## 2022-07-11 DIAGNOSIS — F17210 Nicotine dependence, cigarettes, uncomplicated: Secondary | ICD-10-CM | POA: Diagnosis not present

## 2022-07-11 DIAGNOSIS — F17200 Nicotine dependence, unspecified, uncomplicated: Secondary | ICD-10-CM

## 2022-07-11 NOTE — Progress Notes (Signed)
[name]. Date of Birth: 09-21-1951 MRN: [id number] Primary Care Provider:Collins, Hinton Dyer, DO Former Cardiology Providers: Dr. Adrian Prows Primary Cardiologist: Rex Kras, DO, [facility] (established care 07/07/2020) Primary nephrologist: Dr. Santiago Bumpers   Date: 07/11/22 Last Office Visit: 01/08/2022  Chief Complaint  Patient presents with   Carotid artery stenosis, asymptomatic, bilateral   Follow-up    6 month    HPI  [name]. is a 71 y.o.  male whose past medical history and cardiovascular risk factors include: Diabetes mellitus type 2, FSGS, carotid artery stenosis, GERD, hyperlipidemia, hypertension, nonsecretory pituitary macroadenoma, rheumatoid arthritis, bronchitis, active tobacco use, advanced age, obesity due to excess calories.  Patient establish care back in August 2021 for hypertension management.  At that time his blood pressures were around 198/87 mmHg.  His medications were uptitrated and referred to nephrology given his chronic kidney disease.  He now follows with Dr. Santiago Bumpers the patient is noted to have evere arterionephrosclerosis with diffuse severe IFTA superimposed FSGS and diabetic glomerular nephropathy.  And since then his hypertension is being managed by nephrology going forward.  Patient is now being followed by the practice for carotid artery stenosis.  His last carotid duplex illustrated improvement in left ICA &  the right ICA remained stable.  He now presents for 44-monthfollow-up visit.  Since the last office visit his renal function has continued to decline he chooses not to proceed w/ dialysis if and when needed. Therefore, he is enrolled into hospice per patient and wife. No chest pain or heart failure symptoms. He makes minimal urine output.   FUNCTIONAL STATUS: No structured exercise program or daily routine.  ALLERGIES: Allergies  Allergen Reactions   Atorvastatin Other (See Comments)    unknown   FIran[Dapagliflozin]  Other (See Comments)    Yeast infection    Folic Acid Hives   Humira [Adalimumab] Other (See Comments)    Caused a stroke    Metformin And Related Other (See Comments)    Weak if takes 1000 mg   Plavix [Clopidogrel Bisulfate] Hives   Simvastatin Other (See Comments)    Couldn't think straight     MEDICATION LIST PRIOR TO VISIT: Current Outpatient Medications on File Prior to Visit  Medication Sig Dispense Refill   acetaminophen (TYLENOL) 650 MG CR tablet Take 1,300 mg by mouth every 8 (eight) hours as needed for pain.     albuterol (PROVENTIL HFA;VENTOLIN HFA) 108 (90 Base) MCG/ACT inhaler Inhale 1-2 puffs into the lungs every 6 (six) hours as needed for wheezing or shortness of breath.     ALPRAZolam (XANAX) 1 MG tablet Take 1-2 mg by mouth every 4 (four) hours as needed.     aspirin EC 81 MG tablet Take 81 mg by mouth daily.      cetirizine (ZYRTEC) 10 MG tablet Take 10 mg by mouth daily.     cyclobenzaprine (FLEXERIL) 10 MG tablet Take 10 mg by mouth at bedtime.     diclofenac Sodium (VOLTAREN) 1 % GEL Apply 2 g topically 4 (four) times daily as needed (pain).      doxazosin (CARDURA) 2 MG tablet Take 2 mg by mouth 2 (two) times daily.      EPINEPHrine 0.3 mg/0.3 mL IJ SOAJ injection Inject 0.3 mLs (0.3 mg total) into the muscle as needed for up to 2 doses. (Patient taking differently: Inject 0.3 mg into the muscle as needed for anaphylaxis.) 2 Device 0   escitalopram (LEXAPRO) 10  MG tablet Take 10 mg by mouth daily.     ferrous sulfate 325 (65 FE) MG tablet Take 325 mg by mouth daily.     furosemide (LASIX) 80 MG tablet Take 80 mg by mouth 2 (two) times daily.     isosorbide dinitrate (ISORDIL) 30 MG tablet TAKE 2 TABLETS (60 MG TOTAL) BY MOUTH 3 (THREE) TIMES DAILY. (Patient taking differently: Take 60 mg by mouth 2 (two) times daily.) 540 tablet 0   leflunomide (ARAVA) 20 MG tablet Take 20 mg by mouth daily.     levothyroxine (SYNTHROID) 175 MCG tablet Take 175 mcg by mouth daily  before breakfast.     ONETOUCH VERIO test strip 3 (three) times daily.     pantoprazole (PROTONIX) 20 MG tablet Take 20 mg by mouth daily.     predniSONE (DELTASONE) 5 MG tablet Take 5 mg by mouth daily as needed (arthritis).      spironolactone (ALDACTONE) 25 MG tablet Take 25 mg by mouth daily.     No current facility-administered medications on file prior to visit.    PAST MEDICAL HISTORY: Past Medical History:  Diagnosis Date   Asthma    Chronic kidney disease    Diabetes mellitus without complication (HCC)    Hyperlipidemia    Hypertension    Hypothyroidism    Obesity    Pituitary macroadenoma (HCC)    Rheumatoid arthritis ([place])    Sleep apnea     PAST SURGICAL HISTORY: Past Surgical History:  Procedure Laterality Date   Arthroscopic knee surgery Right 1998   Gamma knife surgery  2011   HAND SURGERY     THYROIDECTOMY     TONSILLECTOMY     TUMOR REMOVAL     Pituitary    FAMILY HISTORY: The patient's family history includes Emphysema in his father; Healthy in his brother, sister, and sister; Hypertension in his mother.   SOCIAL HISTORY:  The patient  reports that he has been smoking cigarettes. He has a 40.00 pack-year smoking history. He has never used smokeless tobacco. He reports current alcohol use. He reports that he does not currently use drugs after having used the following drugs: Marijuana.  Review of Systems  Constitutional: Positive for weight loss. Negative for chills and fever.  HENT:  Negative for hoarse voice and nosebleeds.   Eyes:  Negative for discharge, double vision and pain.  Cardiovascular:  Positive for dyspnea on exertion (improved. ). Negative for chest pain, claudication, leg swelling, near-syncope, orthopnea, palpitations, paroxysmal nocturnal dyspnea and syncope.  Respiratory:  Negative for hemoptysis and shortness of breath.   Musculoskeletal:  Negative for muscle cramps and myalgias.  Gastrointestinal:  Positive for dysphagia. Negative  for abdominal pain, constipation, diarrhea, hematemesis, hematochezia, melena, nausea and vomiting.  Neurological:  Negative for dizziness and light-headedness.    PHYSICAL EXAM:    07/11/2022   10:12 AM 01/08/2022   10:02 AM 08/30/2021   10:40 AM  Vitals with BMI  Height '5\' 8"'  '5\' 8"'  '5\' 8"'   Weight 246 lbs 256 lbs 13 oz 254 lbs  BMI 37.41 19.62 22.97  Systolic 989 211 941  Diastolic 52 63 59  Pulse 61 53 54   Physical Exam  Constitutional: No distress.  Age appropriate, hemodynamically stable.   Neck: No JVD present.  Cardiovascular: Normal rate, regular rhythm, S1 normal, S2 normal, intact distal pulses and normal pulses. Exam reveals no gallop, no S3 and no S4.  No murmur heard. Pulses:  Carotid pulses are  on the right side with bruit and  on the left side with bruit. Pulmonary/Chest: Effort normal and breath sounds normal. No stridor. He has no wheezes. He has no rales.  Abdominal: Soft. Bowel sounds are normal. He exhibits no distension. There is no abdominal tenderness.  Musculoskeletal:        General: Edema (trace bilateral) present.     Cervical back: Neck supple.  Neurological: He is alert and oriented to person, place, and time. He has intact cranial nerves (2-12).  Skin: Skin is warm and moist.   CARDIAC DATABASE: EKG: 07/11/2022: Sinus rhythm, 69 bpm, incomplete right bundle branch block, nonspecific T wave abnormality.  Echocardiogram: 12/25/2021: Normal LV systolic function with EF 65%. Left ventricle cavity is normal in size. Moderate concentric hypertrophy of the left ventricle. Normal global Zaccaro motion.  Left atrial cavity is severely dilated. Aortic valve not well visualized. Trace aortic stenosis. Vmax 2.1 m/sec, mean PG 9 mmHg, AVA 2.5 cm by continuity equation. Mild to moderate mitral regurgitation. Mild to moderate tricuspid regurgitation. Estimated pulmonary artery systolic pressure 31 mmHg.  Compared to previous study in 2013, LA dilatation increased  from slight. MR, TR are new.   Stress Testing:  January 2014: Myocardial perfusion imaging: Perfusion images reveal a moderate sized inferior and apical nontransmural scar without superimposed ischemia.  LVEF 61%.  Low risk study clinical correlation recommended in a patient who weighs 260 pounds and a BMI of 46.  Heart Catheterization: None  Carotid artery duplex 12/26/2020: Duplex suggests stenosis in the right internal carotid artery (16-49%). Duplex suggests stenosis in the left internal carotid artery (16-49%). There is mild heterogenous plaque noted in bilateral carotid arteries. Right vertebral artery flow is not visualized. Antegrade left vertebral artery flow. Compared to the study done on 07/06/2021, there is regression of bilateral carotid artery stenosis. Follow-up studies if clinically indicated.  Lower extremity arterial duplex: 06/22/2018: Biphasic waveform in the right proximal SFA and below suggest diffuse disease no hemodynamically significant stenosis identified in the left lower extremity.  ABI mildly reduced in the right lower extremity.  Normal ABI suggestive of normal perfusion in the left lower extremity.  LABORATORY DATA: External Labs: Collected: 07/04/2020 Creatinine 2.9 mg/dL. eGFR: 23 mL/min per 1.73 m Sodium 146, BUN 36, BUN to creatinine ratio 12.2 TSH: 0.59    Lipid profile: Collected: 05/03/2020 Total cholesterol 181, triglycerides 285, HDL 57, LDL 67, non-HDL 124  Hemoglobin A1C   2022-02-07    Estimated Average Glucose 103      Hemoglobin A1C 5.2   <5.7  Lipid Panel   2022-02-07    Cholesterol 117   <200  Cholesterol / HDL Ratio 2.79   0.00-4.99  HDL Cholesterol 42   >39  LDL Cholesterol (Calculation) 48   <130  LDL/HDL Ratio 1.2   <3.3  Non-HDL Cholesterol 75   <130  Triglycerides 133   <150  PSA   2022-02-07    PSA 0.913   <4.001  TSH   2022-02-07    TSH 0.20   0.43-5.25  CBC With Platelet And Differential RS In-house   2022-02-07     Absolute Basophils 0.0   0.0-0.1  Absolute Eosinophils 0.1   0.0-0.6  Absolute Lymphocytes 0.7   0.9-3.6  Absolute Monocytes 0.8   0.3-1.0  Absolute Neutrophils 7.4   2.0-8.2  Basophils Automated 0.1   0.0-2.0  Eosinophils Automated 0.9   0.0-7.0  Hematocrit 29.2   37.0-47.0  Hemoglobin 9.1  13.5-18.0  Lymphocytes Automated 7.9   20.5-51.1  MCH 28.6   26.0-33.0  MCHC 31.2   32.0-36.0  MCV 91.8   80.0-100.0  Monocytes Automated 9.2   5.0-12.0  Neutrophils Automated 81.9   42.2-75.2  Platelet Count 247   140-400  RBC 3.18   4.20-5.40  RDW 46.9      WBC 9.1   4.5-11.0  Comprehensive Metabolic Panel RS In-house   2022-02-07    Albumin 3.0   3.4-5.0  Albumin/Globulin Ratio 1.0   1.1-2.5  Alkaline Phosphatase 101   25-150  ALT (SGPT) 15   <6-78  AST (SGOT) 16   0-40  Bilirubin, Total 0.2   0.2-1.0  BUN 89   7-18  BUN/Creatinine Ratio 19.3   11.0-26.0  Calcium 8.3   8.5-10.1  Chloride 106   98-107  CO2 20   21-32  Creatinine 4.60   0.70-1.30  GFR/Black 14   >59  GFR/White 12   >59  Globulin, Calculated 3.1   1.5-4.6  Glucose 102   74-106  Potassium 4.9   3.5-5.1  Protein 6.1   6.4-8.2  Sodium 141   136-145    IMPRESSION:    ICD-10-CM   1. Carotid artery stenosis, asymptomatic, bilateral  I65.23 EKG 12-Lead    2. Benign hypertension  I10     3. OSA on CPAP  G47.33    Z99.89     4. Type 2 diabetes mellitus with hyperglycemia, without long-term current use of insulin (HCC)  E11.65     5. Type 2 diabetes mellitus with stage 5 chronic kidney disease not on chronic dialysis, without long-term current use of insulin (HCC)  E11.22    N18.5     6. Mixed hyperlipidemia  E78.2     7. Smoking  F17.200        RECOMMENDATIONS: Kennieth Plotts. is a 71 y.o. male whose past medical history and cardiovascular risk factors include: Diabetes mellitus type 2, FSGS, carotid artery stenosis, GERD, hyperlipidemia, hypertension, nonsecretory pituitary macroadenoma, rheumatoid  arthritis, bronchitis, active tobacco use, advanced age, obesity due to excess calories.  Carotid artery stenosis, asymptomatic, bilateral Asymptomatic. Continue Aspirin.  He chooses not to be on statin due to prior intolerances. Unfortunately continues to smoke cigarettes. We will hold off on performing further carotid duplex for disease progression as patient is now enrolled into hospice care.  Patient and wife are agreeable.  If in the future he disenrolls from hospice care will reconsider resuming carotid duplex.   Benign hypertension Well controlled  Improved since establishing care. Currently being managed by nephrology Dr. Santiago Bumpers. Educated on the importance of a low-salt diet and avoiding nephrotoxic agents. Patient is compliant with his CPAP on a daily basis  OSA on CPAP Reemphasized the importance of compliance.  Type 2 diabetes mellitus with hyperglycemia and CKD5, without long-term current use of insulin (HCC) Diabetes currently managed by endocrinology, per patient. Also follows with nephrology given his progressive CKD. Medications reconciled.  Smoking Tobacco cessation counseling: Currently smoking 0.5 packs/day   Patient was informed of the dangers of tobacco abuse including stroke, cancer, and MI, as well as benefits of tobacco cessation. Patient is not willing to quit at this time. 5 mins were spent counseling patient cessation techniques. We discussed various methods to help quit smoking, including deciding on a date to quit, joining a support group, pharmacological agents- nicotine gum/patch/lozenges.  I will reassess his progress at the next follow-up visit    FINAL  MEDICATION LIST END OF ENCOUNTER:   Current Outpatient Medications:    acetaminophen (TYLENOL) 650 MG CR tablet, Take 1,300 mg by mouth every 8 (eight) hours as needed for pain., Disp: , Rfl:    albuterol (PROVENTIL HFA;VENTOLIN HFA) 108 (90 Base) MCG/ACT inhaler, Inhale 1-2 puffs into  the lungs every 6 (six) hours as needed for wheezing or shortness of breath., Disp: , Rfl:    ALPRAZolam (XANAX) 1 MG tablet, Take 1-2 mg by mouth every 4 (four) hours as needed., Disp: , Rfl:    aspirin EC 81 MG tablet, Take 81 mg by mouth daily. , Disp: , Rfl:    cetirizine (ZYRTEC) 10 MG tablet, Take 10 mg by mouth daily., Disp: , Rfl:    cyclobenzaprine (FLEXERIL) 10 MG tablet, Take 10 mg by mouth at bedtime., Disp: , Rfl:    diclofenac Sodium (VOLTAREN) 1 % GEL, Apply 2 g topically 4 (four) times daily as needed (pain). , Disp: , Rfl:    doxazosin (CARDURA) 2 MG tablet, Take 2 mg by mouth 2 (two) times daily. , Disp: , Rfl:    EPINEPHrine 0.3 mg/0.3 mL IJ SOAJ injection, Inject 0.3 mLs (0.3 mg total) into the muscle as needed for up to 2 doses. (Patient taking differently: Inject 0.3 mg into the muscle as needed for anaphylaxis.), Disp: 2 Device, Rfl: 0   escitalopram (LEXAPRO) 10 MG tablet, Take 10 mg by mouth daily., Disp: , Rfl:    ferrous sulfate 325 (65 FE) MG tablet, Take 325 mg by mouth daily., Disp: , Rfl:    furosemide (LASIX) 80 MG tablet, Take 80 mg by mouth 2 (two) times daily., Disp: , Rfl:    isosorbide dinitrate (ISORDIL) 30 MG tablet, TAKE 2 TABLETS (60 MG TOTAL) BY MOUTH 3 (THREE) TIMES DAILY. (Patient taking differently: Take 60 mg by mouth 2 (two) times daily.), Disp: 540 tablet, Rfl: 0   leflunomide (ARAVA) 20 MG tablet, Take 20 mg by mouth daily., Disp: , Rfl:    levothyroxine (SYNTHROID) 175 MCG tablet, Take 175 mcg by mouth daily before breakfast., Disp: , Rfl:    ONETOUCH VERIO test strip, 3 (three) times daily., Disp: , Rfl:    pantoprazole (PROTONIX) 20 MG tablet, Take 20 mg by mouth daily., Disp: , Rfl:    predniSONE (DELTASONE) 5 MG tablet, Take 5 mg by mouth daily as needed (arthritis). , Disp: , Rfl:    spironolactone (ALDACTONE) 25 MG tablet, Take 25 mg by mouth daily., Disp: , Rfl:    labetalol (NORMODYNE) 200 MG tablet, TAKE 1 TABLET BY MOUTH TWICE A DAY, Disp:  60 tablet, Rfl: 0  Orders Placed This Encounter  Procedures   EKG 12-Lead    --Continue cardiac medications as reconciled in final medication list. --Return in about 1 year (around 07/12/2023) for Follow up, Carotid disease. Or sooner if needed. --Continue follow-up with your primary care physician regarding the management of your other chronic comorbid conditions.  Patient's questions and concerns were addressed to his satisfaction. He voices understanding of the instructions provided during this encounter.   This note was created using a voice recognition software as a result there may be grammatical errors inadvertently enclosed that do not reflect the nature of this encounter. Every attempt is made to correct such errors.  Total time spent: 20 minutes.  Rex Kras, [place], [facility]  Pager: [phone] Office: [phone]

## 2022-07-12 DIAGNOSIS — N185 Chronic kidney disease, stage 5: Secondary | ICD-10-CM | POA: Diagnosis not present

## 2022-07-12 DIAGNOSIS — I10 Essential (primary) hypertension: Secondary | ICD-10-CM | POA: Diagnosis not present

## 2022-07-12 DIAGNOSIS — F419 Anxiety disorder, unspecified: Secondary | ICD-10-CM | POA: Diagnosis not present

## 2022-07-12 DIAGNOSIS — D631 Anemia in chronic kidney disease: Secondary | ICD-10-CM | POA: Diagnosis not present

## 2022-07-12 DIAGNOSIS — I6521 Occlusion and stenosis of right carotid artery: Secondary | ICD-10-CM | POA: Diagnosis not present

## 2022-07-12 DIAGNOSIS — E1100 Type 2 diabetes mellitus with hyperosmolarity without nonketotic hyperglycemic-hyperosmolar coma (NKHHC): Secondary | ICD-10-CM | POA: Diagnosis not present

## 2022-07-15 ENCOUNTER — Encounter: Payer: Self-pay | Admitting: Cardiology

## 2022-07-18 DIAGNOSIS — E1122 Type 2 diabetes mellitus with diabetic chronic kidney disease: Secondary | ICD-10-CM | POA: Diagnosis not present

## 2022-07-18 DIAGNOSIS — E1100 Type 2 diabetes mellitus with hyperosmolarity without nonketotic hyperglycemic-hyperosmolar coma (NKHHC): Secondary | ICD-10-CM | POA: Diagnosis not present

## 2022-07-18 DIAGNOSIS — D631 Anemia in chronic kidney disease: Secondary | ICD-10-CM | POA: Diagnosis not present

## 2022-07-18 DIAGNOSIS — I6521 Occlusion and stenosis of right carotid artery: Secondary | ICD-10-CM | POA: Diagnosis not present

## 2022-07-18 DIAGNOSIS — F419 Anxiety disorder, unspecified: Secondary | ICD-10-CM | POA: Diagnosis not present

## 2022-07-18 DIAGNOSIS — I120 Hypertensive chronic kidney disease with stage 5 chronic kidney disease or end stage renal disease: Secondary | ICD-10-CM | POA: Diagnosis not present

## 2022-07-18 DIAGNOSIS — E039 Hypothyroidism, unspecified: Secondary | ICD-10-CM | POA: Diagnosis not present

## 2022-07-18 DIAGNOSIS — N185 Chronic kidney disease, stage 5: Secondary | ICD-10-CM | POA: Diagnosis not present

## 2022-07-18 DIAGNOSIS — R197 Diarrhea, unspecified: Secondary | ICD-10-CM | POA: Diagnosis not present

## 2022-07-18 DIAGNOSIS — E872 Acidosis, unspecified: Secondary | ICD-10-CM | POA: Diagnosis not present

## 2022-07-18 DIAGNOSIS — N2581 Secondary hyperparathyroidism of renal origin: Secondary | ICD-10-CM | POA: Diagnosis not present

## 2022-07-18 DIAGNOSIS — I10 Essential (primary) hypertension: Secondary | ICD-10-CM | POA: Diagnosis not present

## 2022-07-25 DIAGNOSIS — I6521 Occlusion and stenosis of right carotid artery: Secondary | ICD-10-CM | POA: Diagnosis not present

## 2022-07-25 DIAGNOSIS — F419 Anxiety disorder, unspecified: Secondary | ICD-10-CM | POA: Diagnosis not present

## 2022-07-25 DIAGNOSIS — I10 Essential (primary) hypertension: Secondary | ICD-10-CM | POA: Diagnosis not present

## 2022-07-25 DIAGNOSIS — E1100 Type 2 diabetes mellitus with hyperosmolarity without nonketotic hyperglycemic-hyperosmolar coma (NKHHC): Secondary | ICD-10-CM | POA: Diagnosis not present

## 2022-07-25 DIAGNOSIS — D631 Anemia in chronic kidney disease: Secondary | ICD-10-CM | POA: Diagnosis not present

## 2022-07-25 DIAGNOSIS — N185 Chronic kidney disease, stage 5: Secondary | ICD-10-CM | POA: Diagnosis not present

## 2022-08-01 DIAGNOSIS — N185 Chronic kidney disease, stage 5: Secondary | ICD-10-CM | POA: Diagnosis not present

## 2022-08-01 DIAGNOSIS — D631 Anemia in chronic kidney disease: Secondary | ICD-10-CM | POA: Diagnosis not present

## 2022-08-01 DIAGNOSIS — E1100 Type 2 diabetes mellitus with hyperosmolarity without nonketotic hyperglycemic-hyperosmolar coma (NKHHC): Secondary | ICD-10-CM | POA: Diagnosis not present

## 2022-08-01 DIAGNOSIS — I10 Essential (primary) hypertension: Secondary | ICD-10-CM | POA: Diagnosis not present

## 2022-08-01 DIAGNOSIS — F419 Anxiety disorder, unspecified: Secondary | ICD-10-CM | POA: Diagnosis not present

## 2022-08-01 DIAGNOSIS — I6521 Occlusion and stenosis of right carotid artery: Secondary | ICD-10-CM | POA: Diagnosis not present

## 2022-08-03 DIAGNOSIS — I6521 Occlusion and stenosis of right carotid artery: Secondary | ICD-10-CM | POA: Diagnosis not present

## 2022-08-03 DIAGNOSIS — E1100 Type 2 diabetes mellitus with hyperosmolarity without nonketotic hyperglycemic-hyperosmolar coma (NKHHC): Secondary | ICD-10-CM | POA: Diagnosis not present

## 2022-08-03 DIAGNOSIS — F419 Anxiety disorder, unspecified: Secondary | ICD-10-CM | POA: Diagnosis not present

## 2022-08-03 DIAGNOSIS — C73 Malignant neoplasm of thyroid gland: Secondary | ICD-10-CM | POA: Diagnosis not present

## 2022-08-03 DIAGNOSIS — Z66 Do not resuscitate: Secondary | ICD-10-CM | POA: Diagnosis not present

## 2022-08-03 DIAGNOSIS — I10 Essential (primary) hypertension: Secondary | ICD-10-CM | POA: Diagnosis not present

## 2022-08-03 DIAGNOSIS — N185 Chronic kidney disease, stage 5: Secondary | ICD-10-CM | POA: Diagnosis not present

## 2022-08-03 DIAGNOSIS — D631 Anemia in chronic kidney disease: Secondary | ICD-10-CM | POA: Diagnosis not present

## 2022-08-03 DIAGNOSIS — Z515 Encounter for palliative care: Secondary | ICD-10-CM | POA: Diagnosis not present

## 2022-08-04 ENCOUNTER — Other Ambulatory Visit: Payer: Self-pay | Admitting: Cardiology

## 2022-08-04 DIAGNOSIS — I10 Essential (primary) hypertension: Secondary | ICD-10-CM

## 2022-08-07 DIAGNOSIS — I6521 Occlusion and stenosis of right carotid artery: Secondary | ICD-10-CM | POA: Diagnosis not present

## 2022-08-07 DIAGNOSIS — E1100 Type 2 diabetes mellitus with hyperosmolarity without nonketotic hyperglycemic-hyperosmolar coma (NKHHC): Secondary | ICD-10-CM | POA: Diagnosis not present

## 2022-08-07 DIAGNOSIS — F419 Anxiety disorder, unspecified: Secondary | ICD-10-CM | POA: Diagnosis not present

## 2022-08-07 DIAGNOSIS — I10 Essential (primary) hypertension: Secondary | ICD-10-CM | POA: Diagnosis not present

## 2022-08-07 DIAGNOSIS — D631 Anemia in chronic kidney disease: Secondary | ICD-10-CM | POA: Diagnosis not present

## 2022-08-07 DIAGNOSIS — N185 Chronic kidney disease, stage 5: Secondary | ICD-10-CM | POA: Diagnosis not present

## 2022-08-08 DIAGNOSIS — D631 Anemia in chronic kidney disease: Secondary | ICD-10-CM | POA: Diagnosis not present

## 2022-08-08 DIAGNOSIS — I10 Essential (primary) hypertension: Secondary | ICD-10-CM | POA: Diagnosis not present

## 2022-08-08 DIAGNOSIS — N185 Chronic kidney disease, stage 5: Secondary | ICD-10-CM | POA: Diagnosis not present

## 2022-08-08 DIAGNOSIS — F419 Anxiety disorder, unspecified: Secondary | ICD-10-CM | POA: Diagnosis not present

## 2022-08-08 DIAGNOSIS — I6521 Occlusion and stenosis of right carotid artery: Secondary | ICD-10-CM | POA: Diagnosis not present

## 2022-08-08 DIAGNOSIS — E1100 Type 2 diabetes mellitus with hyperosmolarity without nonketotic hyperglycemic-hyperosmolar coma (NKHHC): Secondary | ICD-10-CM | POA: Diagnosis not present

## 2022-08-14 DIAGNOSIS — N185 Chronic kidney disease, stage 5: Secondary | ICD-10-CM | POA: Diagnosis not present

## 2022-08-14 DIAGNOSIS — E7800 Pure hypercholesterolemia, unspecified: Secondary | ICD-10-CM | POA: Diagnosis not present

## 2022-08-14 DIAGNOSIS — D631 Anemia in chronic kidney disease: Secondary | ICD-10-CM | POA: Diagnosis not present

## 2022-08-15 DIAGNOSIS — F419 Anxiety disorder, unspecified: Secondary | ICD-10-CM | POA: Diagnosis not present

## 2022-08-15 DIAGNOSIS — E1100 Type 2 diabetes mellitus with hyperosmolarity without nonketotic hyperglycemic-hyperosmolar coma (NKHHC): Secondary | ICD-10-CM | POA: Diagnosis not present

## 2022-08-15 DIAGNOSIS — D631 Anemia in chronic kidney disease: Secondary | ICD-10-CM | POA: Diagnosis not present

## 2022-08-15 DIAGNOSIS — N185 Chronic kidney disease, stage 5: Secondary | ICD-10-CM | POA: Diagnosis not present

## 2022-08-15 DIAGNOSIS — I6521 Occlusion and stenosis of right carotid artery: Secondary | ICD-10-CM | POA: Diagnosis not present

## 2022-08-15 DIAGNOSIS — I10 Essential (primary) hypertension: Secondary | ICD-10-CM | POA: Diagnosis not present

## 2022-08-16 DIAGNOSIS — F419 Anxiety disorder, unspecified: Secondary | ICD-10-CM | POA: Diagnosis not present

## 2022-08-16 DIAGNOSIS — D631 Anemia in chronic kidney disease: Secondary | ICD-10-CM | POA: Diagnosis not present

## 2022-08-16 DIAGNOSIS — E1100 Type 2 diabetes mellitus with hyperosmolarity without nonketotic hyperglycemic-hyperosmolar coma (NKHHC): Secondary | ICD-10-CM | POA: Diagnosis not present

## 2022-08-16 DIAGNOSIS — I6521 Occlusion and stenosis of right carotid artery: Secondary | ICD-10-CM | POA: Diagnosis not present

## 2022-08-16 DIAGNOSIS — I10 Essential (primary) hypertension: Secondary | ICD-10-CM | POA: Diagnosis not present

## 2022-08-16 DIAGNOSIS — N185 Chronic kidney disease, stage 5: Secondary | ICD-10-CM | POA: Diagnosis not present

## 2022-08-22 DIAGNOSIS — F5101 Primary insomnia: Secondary | ICD-10-CM | POA: Diagnosis not present

## 2022-08-22 DIAGNOSIS — D631 Anemia in chronic kidney disease: Secondary | ICD-10-CM | POA: Diagnosis not present

## 2022-08-22 DIAGNOSIS — N400 Enlarged prostate without lower urinary tract symptoms: Secondary | ICD-10-CM | POA: Diagnosis not present

## 2022-08-22 DIAGNOSIS — E7800 Pure hypercholesterolemia, unspecified: Secondary | ICD-10-CM | POA: Diagnosis not present

## 2022-08-22 DIAGNOSIS — N185 Chronic kidney disease, stage 5: Secondary | ICD-10-CM | POA: Diagnosis not present

## 2022-08-22 DIAGNOSIS — E890 Postprocedural hypothyroidism: Secondary | ICD-10-CM | POA: Diagnosis not present

## 2022-08-22 DIAGNOSIS — I10 Essential (primary) hypertension: Secondary | ICD-10-CM | POA: Diagnosis not present

## 2022-08-22 DIAGNOSIS — F418 Other specified anxiety disorders: Secondary | ICD-10-CM | POA: Diagnosis not present

## 2022-08-22 DIAGNOSIS — E118 Type 2 diabetes mellitus with unspecified complications: Secondary | ICD-10-CM | POA: Diagnosis not present

## 2022-08-22 DIAGNOSIS — J432 Centrilobular emphysema: Secondary | ICD-10-CM | POA: Diagnosis not present

## 2022-08-23 DIAGNOSIS — E1100 Type 2 diabetes mellitus with hyperosmolarity without nonketotic hyperglycemic-hyperosmolar coma (NKHHC): Secondary | ICD-10-CM | POA: Diagnosis not present

## 2022-08-23 DIAGNOSIS — D631 Anemia in chronic kidney disease: Secondary | ICD-10-CM | POA: Diagnosis not present

## 2022-08-23 DIAGNOSIS — F419 Anxiety disorder, unspecified: Secondary | ICD-10-CM | POA: Diagnosis not present

## 2022-08-23 DIAGNOSIS — I10 Essential (primary) hypertension: Secondary | ICD-10-CM | POA: Diagnosis not present

## 2022-08-23 DIAGNOSIS — N185 Chronic kidney disease, stage 5: Secondary | ICD-10-CM | POA: Diagnosis not present

## 2022-08-23 DIAGNOSIS — I6521 Occlusion and stenosis of right carotid artery: Secondary | ICD-10-CM | POA: Diagnosis not present

## 2022-08-24 DIAGNOSIS — N185 Chronic kidney disease, stage 5: Secondary | ICD-10-CM | POA: Diagnosis not present

## 2022-08-24 DIAGNOSIS — E1100 Type 2 diabetes mellitus with hyperosmolarity without nonketotic hyperglycemic-hyperosmolar coma (NKHHC): Secondary | ICD-10-CM | POA: Diagnosis not present

## 2022-08-24 DIAGNOSIS — D631 Anemia in chronic kidney disease: Secondary | ICD-10-CM | POA: Diagnosis not present

## 2022-08-24 DIAGNOSIS — I6521 Occlusion and stenosis of right carotid artery: Secondary | ICD-10-CM | POA: Diagnosis not present

## 2022-08-24 DIAGNOSIS — F419 Anxiety disorder, unspecified: Secondary | ICD-10-CM | POA: Diagnosis not present

## 2022-08-24 DIAGNOSIS — I10 Essential (primary) hypertension: Secondary | ICD-10-CM | POA: Diagnosis not present

## 2022-08-29 DIAGNOSIS — N185 Chronic kidney disease, stage 5: Secondary | ICD-10-CM | POA: Diagnosis not present

## 2022-08-29 DIAGNOSIS — D631 Anemia in chronic kidney disease: Secondary | ICD-10-CM | POA: Diagnosis not present

## 2022-08-29 DIAGNOSIS — E1100 Type 2 diabetes mellitus with hyperosmolarity without nonketotic hyperglycemic-hyperosmolar coma (NKHHC): Secondary | ICD-10-CM | POA: Diagnosis not present

## 2022-08-29 DIAGNOSIS — F419 Anxiety disorder, unspecified: Secondary | ICD-10-CM | POA: Diagnosis not present

## 2022-08-29 DIAGNOSIS — I10 Essential (primary) hypertension: Secondary | ICD-10-CM | POA: Diagnosis not present

## 2022-08-29 DIAGNOSIS — I6521 Occlusion and stenosis of right carotid artery: Secondary | ICD-10-CM | POA: Diagnosis not present

## 2022-08-30 DIAGNOSIS — D631 Anemia in chronic kidney disease: Secondary | ICD-10-CM | POA: Diagnosis not present

## 2022-08-30 DIAGNOSIS — N185 Chronic kidney disease, stage 5: Secondary | ICD-10-CM | POA: Diagnosis not present

## 2022-08-30 DIAGNOSIS — E1100 Type 2 diabetes mellitus with hyperosmolarity without nonketotic hyperglycemic-hyperosmolar coma (NKHHC): Secondary | ICD-10-CM | POA: Diagnosis not present

## 2022-08-30 DIAGNOSIS — I6521 Occlusion and stenosis of right carotid artery: Secondary | ICD-10-CM | POA: Diagnosis not present

## 2022-08-30 DIAGNOSIS — F419 Anxiety disorder, unspecified: Secondary | ICD-10-CM | POA: Diagnosis not present

## 2022-08-30 DIAGNOSIS — I10 Essential (primary) hypertension: Secondary | ICD-10-CM | POA: Diagnosis not present

## 2022-09-01 ENCOUNTER — Other Ambulatory Visit: Payer: Self-pay | Admitting: Cardiology

## 2022-09-01 DIAGNOSIS — I10 Essential (primary) hypertension: Secondary | ICD-10-CM

## 2022-09-02 DIAGNOSIS — F419 Anxiety disorder, unspecified: Secondary | ICD-10-CM | POA: Diagnosis not present

## 2022-09-02 DIAGNOSIS — Z66 Do not resuscitate: Secondary | ICD-10-CM | POA: Diagnosis not present

## 2022-09-02 DIAGNOSIS — D631 Anemia in chronic kidney disease: Secondary | ICD-10-CM | POA: Diagnosis not present

## 2022-09-02 DIAGNOSIS — I6521 Occlusion and stenosis of right carotid artery: Secondary | ICD-10-CM | POA: Diagnosis not present

## 2022-09-02 DIAGNOSIS — E1100 Type 2 diabetes mellitus with hyperosmolarity without nonketotic hyperglycemic-hyperosmolar coma (NKHHC): Secondary | ICD-10-CM | POA: Diagnosis not present

## 2022-09-02 DIAGNOSIS — I10 Essential (primary) hypertension: Secondary | ICD-10-CM | POA: Diagnosis not present

## 2022-09-02 DIAGNOSIS — C73 Malignant neoplasm of thyroid gland: Secondary | ICD-10-CM | POA: Diagnosis not present

## 2022-09-02 DIAGNOSIS — N185 Chronic kidney disease, stage 5: Secondary | ICD-10-CM | POA: Diagnosis not present

## 2022-09-02 DIAGNOSIS — Z515 Encounter for palliative care: Secondary | ICD-10-CM | POA: Diagnosis not present

## 2022-09-05 ENCOUNTER — Other Ambulatory Visit: Payer: Self-pay | Admitting: Cardiology

## 2022-09-05 DIAGNOSIS — I10 Essential (primary) hypertension: Secondary | ICD-10-CM | POA: Diagnosis not present

## 2022-09-05 DIAGNOSIS — I6521 Occlusion and stenosis of right carotid artery: Secondary | ICD-10-CM | POA: Diagnosis not present

## 2022-09-05 DIAGNOSIS — F419 Anxiety disorder, unspecified: Secondary | ICD-10-CM | POA: Diagnosis not present

## 2022-09-05 DIAGNOSIS — E1100 Type 2 diabetes mellitus with hyperosmolarity without nonketotic hyperglycemic-hyperosmolar coma (NKHHC): Secondary | ICD-10-CM | POA: Diagnosis not present

## 2022-09-05 DIAGNOSIS — D631 Anemia in chronic kidney disease: Secondary | ICD-10-CM | POA: Diagnosis not present

## 2022-09-05 DIAGNOSIS — N185 Chronic kidney disease, stage 5: Secondary | ICD-10-CM | POA: Diagnosis not present

## 2022-09-06 DIAGNOSIS — I10 Essential (primary) hypertension: Secondary | ICD-10-CM | POA: Diagnosis not present

## 2022-09-06 DIAGNOSIS — I6521 Occlusion and stenosis of right carotid artery: Secondary | ICD-10-CM | POA: Diagnosis not present

## 2022-09-06 DIAGNOSIS — F419 Anxiety disorder, unspecified: Secondary | ICD-10-CM | POA: Diagnosis not present

## 2022-09-06 DIAGNOSIS — E1100 Type 2 diabetes mellitus with hyperosmolarity without nonketotic hyperglycemic-hyperosmolar coma (NKHHC): Secondary | ICD-10-CM | POA: Diagnosis not present

## 2022-09-06 DIAGNOSIS — N185 Chronic kidney disease, stage 5: Secondary | ICD-10-CM | POA: Diagnosis not present

## 2022-09-06 DIAGNOSIS — D631 Anemia in chronic kidney disease: Secondary | ICD-10-CM | POA: Diagnosis not present

## 2022-09-12 DIAGNOSIS — I10 Essential (primary) hypertension: Secondary | ICD-10-CM | POA: Diagnosis not present

## 2022-09-12 DIAGNOSIS — D631 Anemia in chronic kidney disease: Secondary | ICD-10-CM | POA: Diagnosis not present

## 2022-09-12 DIAGNOSIS — E1100 Type 2 diabetes mellitus with hyperosmolarity without nonketotic hyperglycemic-hyperosmolar coma (NKHHC): Secondary | ICD-10-CM | POA: Diagnosis not present

## 2022-09-12 DIAGNOSIS — I6521 Occlusion and stenosis of right carotid artery: Secondary | ICD-10-CM | POA: Diagnosis not present

## 2022-09-12 DIAGNOSIS — N185 Chronic kidney disease, stage 5: Secondary | ICD-10-CM | POA: Diagnosis not present

## 2022-09-12 DIAGNOSIS — F419 Anxiety disorder, unspecified: Secondary | ICD-10-CM | POA: Diagnosis not present

## 2022-09-19 DIAGNOSIS — I6521 Occlusion and stenosis of right carotid artery: Secondary | ICD-10-CM | POA: Diagnosis not present

## 2022-09-19 DIAGNOSIS — I10 Essential (primary) hypertension: Secondary | ICD-10-CM | POA: Diagnosis not present

## 2022-09-19 DIAGNOSIS — N185 Chronic kidney disease, stage 5: Secondary | ICD-10-CM | POA: Diagnosis not present

## 2022-09-19 DIAGNOSIS — D631 Anemia in chronic kidney disease: Secondary | ICD-10-CM | POA: Diagnosis not present

## 2022-09-19 DIAGNOSIS — F419 Anxiety disorder, unspecified: Secondary | ICD-10-CM | POA: Diagnosis not present

## 2022-09-19 DIAGNOSIS — E1100 Type 2 diabetes mellitus with hyperosmolarity without nonketotic hyperglycemic-hyperosmolar coma (NKHHC): Secondary | ICD-10-CM | POA: Diagnosis not present

## 2022-09-20 ENCOUNTER — Other Ambulatory Visit: Payer: Self-pay | Admitting: Cardiology

## 2022-09-20 DIAGNOSIS — D631 Anemia in chronic kidney disease: Secondary | ICD-10-CM | POA: Diagnosis not present

## 2022-09-20 DIAGNOSIS — I6521 Occlusion and stenosis of right carotid artery: Secondary | ICD-10-CM | POA: Diagnosis not present

## 2022-09-20 DIAGNOSIS — N185 Chronic kidney disease, stage 5: Secondary | ICD-10-CM | POA: Diagnosis not present

## 2022-09-20 DIAGNOSIS — F419 Anxiety disorder, unspecified: Secondary | ICD-10-CM | POA: Diagnosis not present

## 2022-09-20 DIAGNOSIS — E1100 Type 2 diabetes mellitus with hyperosmolarity without nonketotic hyperglycemic-hyperosmolar coma (NKHHC): Secondary | ICD-10-CM | POA: Diagnosis not present

## 2022-09-20 DIAGNOSIS — I10 Essential (primary) hypertension: Secondary | ICD-10-CM | POA: Diagnosis not present

## 2022-09-26 DIAGNOSIS — I10 Essential (primary) hypertension: Secondary | ICD-10-CM | POA: Diagnosis not present

## 2022-09-26 DIAGNOSIS — N185 Chronic kidney disease, stage 5: Secondary | ICD-10-CM | POA: Diagnosis not present

## 2022-09-26 DIAGNOSIS — E1100 Type 2 diabetes mellitus with hyperosmolarity without nonketotic hyperglycemic-hyperosmolar coma (NKHHC): Secondary | ICD-10-CM | POA: Diagnosis not present

## 2022-09-26 DIAGNOSIS — D631 Anemia in chronic kidney disease: Secondary | ICD-10-CM | POA: Diagnosis not present

## 2022-09-26 DIAGNOSIS — I6521 Occlusion and stenosis of right carotid artery: Secondary | ICD-10-CM | POA: Diagnosis not present

## 2022-09-26 DIAGNOSIS — F419 Anxiety disorder, unspecified: Secondary | ICD-10-CM | POA: Diagnosis not present

## 2022-09-28 DIAGNOSIS — I10 Essential (primary) hypertension: Secondary | ICD-10-CM | POA: Diagnosis not present

## 2022-09-28 DIAGNOSIS — E890 Postprocedural hypothyroidism: Secondary | ICD-10-CM | POA: Diagnosis not present

## 2022-09-28 DIAGNOSIS — C73 Malignant neoplasm of thyroid gland: Secondary | ICD-10-CM | POA: Diagnosis not present

## 2022-09-28 DIAGNOSIS — E1122 Type 2 diabetes mellitus with diabetic chronic kidney disease: Secondary | ICD-10-CM | POA: Diagnosis not present

## 2022-09-28 DIAGNOSIS — D443 Neoplasm of uncertain behavior of pituitary gland: Secondary | ICD-10-CM | POA: Diagnosis not present

## 2022-09-28 DIAGNOSIS — Z0000 Encounter for general adult medical examination without abnormal findings: Secondary | ICD-10-CM | POA: Diagnosis not present

## 2022-09-28 DIAGNOSIS — N186 End stage renal disease: Secondary | ICD-10-CM | POA: Diagnosis not present

## 2022-10-03 ENCOUNTER — Other Ambulatory Visit: Payer: Self-pay | Admitting: Cardiology

## 2022-10-03 DIAGNOSIS — Z66 Do not resuscitate: Secondary | ICD-10-CM | POA: Diagnosis not present

## 2022-10-03 DIAGNOSIS — Z515 Encounter for palliative care: Secondary | ICD-10-CM | POA: Diagnosis not present

## 2022-10-03 DIAGNOSIS — C73 Malignant neoplasm of thyroid gland: Secondary | ICD-10-CM | POA: Diagnosis not present

## 2022-10-03 DIAGNOSIS — I10 Essential (primary) hypertension: Secondary | ICD-10-CM

## 2022-10-03 DIAGNOSIS — F419 Anxiety disorder, unspecified: Secondary | ICD-10-CM | POA: Diagnosis not present

## 2022-10-03 DIAGNOSIS — E1100 Type 2 diabetes mellitus with hyperosmolarity without nonketotic hyperglycemic-hyperosmolar coma (NKHHC): Secondary | ICD-10-CM | POA: Diagnosis not present

## 2022-10-03 DIAGNOSIS — D631 Anemia in chronic kidney disease: Secondary | ICD-10-CM | POA: Diagnosis not present

## 2022-10-03 DIAGNOSIS — N185 Chronic kidney disease, stage 5: Secondary | ICD-10-CM | POA: Diagnosis not present

## 2022-10-03 DIAGNOSIS — I6521 Occlusion and stenosis of right carotid artery: Secondary | ICD-10-CM | POA: Diagnosis not present

## 2022-10-05 DIAGNOSIS — F419 Anxiety disorder, unspecified: Secondary | ICD-10-CM | POA: Diagnosis not present

## 2022-10-05 DIAGNOSIS — I10 Essential (primary) hypertension: Secondary | ICD-10-CM | POA: Diagnosis not present

## 2022-10-05 DIAGNOSIS — D631 Anemia in chronic kidney disease: Secondary | ICD-10-CM | POA: Diagnosis not present

## 2022-10-05 DIAGNOSIS — I6521 Occlusion and stenosis of right carotid artery: Secondary | ICD-10-CM | POA: Diagnosis not present

## 2022-10-05 DIAGNOSIS — E1100 Type 2 diabetes mellitus with hyperosmolarity without nonketotic hyperglycemic-hyperosmolar coma (NKHHC): Secondary | ICD-10-CM | POA: Diagnosis not present

## 2022-10-05 DIAGNOSIS — N185 Chronic kidney disease, stage 5: Secondary | ICD-10-CM | POA: Diagnosis not present

## 2022-10-08 DIAGNOSIS — M79643 Pain in unspecified hand: Secondary | ICD-10-CM | POA: Diagnosis not present

## 2022-10-08 DIAGNOSIS — E790 Hyperuricemia without signs of inflammatory arthritis and tophaceous disease: Secondary | ICD-10-CM | POA: Diagnosis not present

## 2022-10-08 DIAGNOSIS — C73 Malignant neoplasm of thyroid gland: Secondary | ICD-10-CM | POA: Diagnosis not present

## 2022-10-08 DIAGNOSIS — M0589 Other rheumatoid arthritis with rheumatoid factor of multiple sites: Secondary | ICD-10-CM | POA: Diagnosis not present

## 2022-10-08 DIAGNOSIS — M179 Osteoarthritis of knee, unspecified: Secondary | ICD-10-CM | POA: Diagnosis not present

## 2022-10-08 DIAGNOSIS — E119 Type 2 diabetes mellitus without complications: Secondary | ICD-10-CM | POA: Diagnosis not present

## 2022-10-08 DIAGNOSIS — N289 Disorder of kidney and ureter, unspecified: Secondary | ICD-10-CM | POA: Diagnosis not present

## 2022-10-08 DIAGNOSIS — M7989 Other specified soft tissue disorders: Secondary | ICD-10-CM | POA: Diagnosis not present

## 2022-10-08 DIAGNOSIS — E1122 Type 2 diabetes mellitus with diabetic chronic kidney disease: Secondary | ICD-10-CM | POA: Diagnosis not present

## 2022-10-08 DIAGNOSIS — M1990 Unspecified osteoarthritis, unspecified site: Secondary | ICD-10-CM | POA: Diagnosis not present

## 2022-10-08 DIAGNOSIS — I10 Essential (primary) hypertension: Secondary | ICD-10-CM | POA: Diagnosis not present

## 2022-10-08 DIAGNOSIS — E890 Postprocedural hypothyroidism: Secondary | ICD-10-CM | POA: Diagnosis not present

## 2022-10-08 DIAGNOSIS — Z79899 Other long term (current) drug therapy: Secondary | ICD-10-CM | POA: Diagnosis not present

## 2022-10-08 DIAGNOSIS — N185 Chronic kidney disease, stage 5: Secondary | ICD-10-CM | POA: Diagnosis not present

## 2022-10-08 DIAGNOSIS — D443 Neoplasm of uncertain behavior of pituitary gland: Secondary | ICD-10-CM | POA: Diagnosis not present

## 2022-10-10 DIAGNOSIS — N185 Chronic kidney disease, stage 5: Secondary | ICD-10-CM | POA: Diagnosis not present

## 2022-10-10 DIAGNOSIS — F419 Anxiety disorder, unspecified: Secondary | ICD-10-CM | POA: Diagnosis not present

## 2022-10-10 DIAGNOSIS — I6521 Occlusion and stenosis of right carotid artery: Secondary | ICD-10-CM | POA: Diagnosis not present

## 2022-10-10 DIAGNOSIS — D631 Anemia in chronic kidney disease: Secondary | ICD-10-CM | POA: Diagnosis not present

## 2022-10-10 DIAGNOSIS — I10 Essential (primary) hypertension: Secondary | ICD-10-CM | POA: Diagnosis not present

## 2022-10-10 DIAGNOSIS — E1100 Type 2 diabetes mellitus with hyperosmolarity without nonketotic hyperglycemic-hyperosmolar coma (NKHHC): Secondary | ICD-10-CM | POA: Diagnosis not present

## 2022-10-12 DIAGNOSIS — M25561 Pain in right knee: Secondary | ICD-10-CM | POA: Diagnosis not present

## 2022-10-12 DIAGNOSIS — M0589 Other rheumatoid arthritis with rheumatoid factor of multiple sites: Secondary | ICD-10-CM | POA: Diagnosis not present

## 2022-10-12 DIAGNOSIS — M79643 Pain in unspecified hand: Secondary | ICD-10-CM | POA: Diagnosis not present

## 2022-10-12 DIAGNOSIS — M25562 Pain in left knee: Secondary | ICD-10-CM | POA: Diagnosis not present

## 2022-10-12 DIAGNOSIS — M1990 Unspecified osteoarthritis, unspecified site: Secondary | ICD-10-CM | POA: Diagnosis not present

## 2022-10-12 DIAGNOSIS — I10 Essential (primary) hypertension: Secondary | ICD-10-CM | POA: Diagnosis not present

## 2022-10-12 DIAGNOSIS — M109 Gout, unspecified: Secondary | ICD-10-CM | POA: Diagnosis not present

## 2022-10-12 DIAGNOSIS — E790 Hyperuricemia without signs of inflammatory arthritis and tophaceous disease: Secondary | ICD-10-CM | POA: Diagnosis not present

## 2022-10-12 DIAGNOSIS — Z79899 Other long term (current) drug therapy: Secondary | ICD-10-CM | POA: Diagnosis not present

## 2022-10-12 DIAGNOSIS — E119 Type 2 diabetes mellitus without complications: Secondary | ICD-10-CM | POA: Diagnosis not present

## 2022-10-12 DIAGNOSIS — M179 Osteoarthritis of knee, unspecified: Secondary | ICD-10-CM | POA: Diagnosis not present

## 2022-10-17 DIAGNOSIS — I10 Essential (primary) hypertension: Secondary | ICD-10-CM | POA: Diagnosis not present

## 2022-10-17 DIAGNOSIS — N185 Chronic kidney disease, stage 5: Secondary | ICD-10-CM | POA: Diagnosis not present

## 2022-10-17 DIAGNOSIS — I6521 Occlusion and stenosis of right carotid artery: Secondary | ICD-10-CM | POA: Diagnosis not present

## 2022-10-17 DIAGNOSIS — F419 Anxiety disorder, unspecified: Secondary | ICD-10-CM | POA: Diagnosis not present

## 2022-10-17 DIAGNOSIS — D631 Anemia in chronic kidney disease: Secondary | ICD-10-CM | POA: Diagnosis not present

## 2022-10-17 DIAGNOSIS — E1100 Type 2 diabetes mellitus with hyperosmolarity without nonketotic hyperglycemic-hyperosmolar coma (NKHHC): Secondary | ICD-10-CM | POA: Diagnosis not present

## 2022-10-19 DIAGNOSIS — E039 Hypothyroidism, unspecified: Secondary | ICD-10-CM | POA: Diagnosis not present

## 2022-10-19 DIAGNOSIS — N185 Chronic kidney disease, stage 5: Secondary | ICD-10-CM | POA: Diagnosis not present

## 2022-10-19 DIAGNOSIS — D631 Anemia in chronic kidney disease: Secondary | ICD-10-CM | POA: Diagnosis not present

## 2022-10-19 DIAGNOSIS — E872 Acidosis, unspecified: Secondary | ICD-10-CM | POA: Diagnosis not present

## 2022-10-19 DIAGNOSIS — I120 Hypertensive chronic kidney disease with stage 5 chronic kidney disease or end stage renal disease: Secondary | ICD-10-CM | POA: Diagnosis not present

## 2022-10-19 DIAGNOSIS — N2581 Secondary hyperparathyroidism of renal origin: Secondary | ICD-10-CM | POA: Diagnosis not present

## 2022-10-19 DIAGNOSIS — M109 Gout, unspecified: Secondary | ICD-10-CM | POA: Diagnosis not present

## 2022-10-19 DIAGNOSIS — E1122 Type 2 diabetes mellitus with diabetic chronic kidney disease: Secondary | ICD-10-CM | POA: Diagnosis not present

## 2022-10-20 DIAGNOSIS — I6521 Occlusion and stenosis of right carotid artery: Secondary | ICD-10-CM | POA: Diagnosis not present

## 2022-10-20 DIAGNOSIS — F419 Anxiety disorder, unspecified: Secondary | ICD-10-CM | POA: Diagnosis not present

## 2022-10-20 DIAGNOSIS — D631 Anemia in chronic kidney disease: Secondary | ICD-10-CM | POA: Diagnosis not present

## 2022-10-20 DIAGNOSIS — N185 Chronic kidney disease, stage 5: Secondary | ICD-10-CM | POA: Diagnosis not present

## 2022-10-20 DIAGNOSIS — I10 Essential (primary) hypertension: Secondary | ICD-10-CM | POA: Diagnosis not present

## 2022-10-20 DIAGNOSIS — E1100 Type 2 diabetes mellitus with hyperosmolarity without nonketotic hyperglycemic-hyperosmolar coma (NKHHC): Secondary | ICD-10-CM | POA: Diagnosis not present

## 2022-10-24 DIAGNOSIS — I6521 Occlusion and stenosis of right carotid artery: Secondary | ICD-10-CM | POA: Diagnosis not present

## 2022-10-24 DIAGNOSIS — D631 Anemia in chronic kidney disease: Secondary | ICD-10-CM | POA: Diagnosis not present

## 2022-10-24 DIAGNOSIS — E1100 Type 2 diabetes mellitus with hyperosmolarity without nonketotic hyperglycemic-hyperosmolar coma (NKHHC): Secondary | ICD-10-CM | POA: Diagnosis not present

## 2022-10-24 DIAGNOSIS — N185 Chronic kidney disease, stage 5: Secondary | ICD-10-CM | POA: Diagnosis not present

## 2022-10-24 DIAGNOSIS — I10 Essential (primary) hypertension: Secondary | ICD-10-CM | POA: Diagnosis not present

## 2022-10-24 DIAGNOSIS — F419 Anxiety disorder, unspecified: Secondary | ICD-10-CM | POA: Diagnosis not present

## 2022-11-01 DIAGNOSIS — D631 Anemia in chronic kidney disease: Secondary | ICD-10-CM | POA: Diagnosis not present

## 2022-11-01 DIAGNOSIS — F419 Anxiety disorder, unspecified: Secondary | ICD-10-CM | POA: Diagnosis not present

## 2022-11-01 DIAGNOSIS — N185 Chronic kidney disease, stage 5: Secondary | ICD-10-CM | POA: Diagnosis not present

## 2022-11-01 DIAGNOSIS — E1100 Type 2 diabetes mellitus with hyperosmolarity without nonketotic hyperglycemic-hyperosmolar coma (NKHHC): Secondary | ICD-10-CM | POA: Diagnosis not present

## 2022-11-01 DIAGNOSIS — I10 Essential (primary) hypertension: Secondary | ICD-10-CM | POA: Diagnosis not present

## 2022-11-01 DIAGNOSIS — I6521 Occlusion and stenosis of right carotid artery: Secondary | ICD-10-CM | POA: Diagnosis not present

## 2022-11-02 DIAGNOSIS — C73 Malignant neoplasm of thyroid gland: Secondary | ICD-10-CM | POA: Diagnosis not present

## 2022-11-02 DIAGNOSIS — I6521 Occlusion and stenosis of right carotid artery: Secondary | ICD-10-CM | POA: Diagnosis not present

## 2022-11-02 DIAGNOSIS — Z66 Do not resuscitate: Secondary | ICD-10-CM | POA: Diagnosis not present

## 2022-11-02 DIAGNOSIS — N185 Chronic kidney disease, stage 5: Secondary | ICD-10-CM | POA: Diagnosis not present

## 2022-11-02 DIAGNOSIS — Z515 Encounter for palliative care: Secondary | ICD-10-CM | POA: Diagnosis not present

## 2022-11-02 DIAGNOSIS — E1100 Type 2 diabetes mellitus with hyperosmolarity without nonketotic hyperglycemic-hyperosmolar coma (NKHHC): Secondary | ICD-10-CM | POA: Diagnosis not present

## 2022-11-02 DIAGNOSIS — F419 Anxiety disorder, unspecified: Secondary | ICD-10-CM | POA: Diagnosis not present

## 2022-11-02 DIAGNOSIS — I10 Essential (primary) hypertension: Secondary | ICD-10-CM | POA: Diagnosis not present

## 2022-11-02 DIAGNOSIS — D631 Anemia in chronic kidney disease: Secondary | ICD-10-CM | POA: Diagnosis not present

## 2022-11-07 DIAGNOSIS — N185 Chronic kidney disease, stage 5: Secondary | ICD-10-CM | POA: Diagnosis not present

## 2022-11-07 DIAGNOSIS — D631 Anemia in chronic kidney disease: Secondary | ICD-10-CM | POA: Diagnosis not present

## 2022-11-07 DIAGNOSIS — I10 Essential (primary) hypertension: Secondary | ICD-10-CM | POA: Diagnosis not present

## 2022-11-07 DIAGNOSIS — I6521 Occlusion and stenosis of right carotid artery: Secondary | ICD-10-CM | POA: Diagnosis not present

## 2022-11-07 DIAGNOSIS — F419 Anxiety disorder, unspecified: Secondary | ICD-10-CM | POA: Diagnosis not present

## 2022-11-07 DIAGNOSIS — E1100 Type 2 diabetes mellitus with hyperosmolarity without nonketotic hyperglycemic-hyperosmolar coma (NKHHC): Secondary | ICD-10-CM | POA: Diagnosis not present

## 2022-11-14 DIAGNOSIS — D631 Anemia in chronic kidney disease: Secondary | ICD-10-CM | POA: Diagnosis not present

## 2022-11-14 DIAGNOSIS — E1100 Type 2 diabetes mellitus with hyperosmolarity without nonketotic hyperglycemic-hyperosmolar coma (NKHHC): Secondary | ICD-10-CM | POA: Diagnosis not present

## 2022-11-14 DIAGNOSIS — I10 Essential (primary) hypertension: Secondary | ICD-10-CM | POA: Diagnosis not present

## 2022-11-14 DIAGNOSIS — N185 Chronic kidney disease, stage 5: Secondary | ICD-10-CM | POA: Diagnosis not present

## 2022-11-14 DIAGNOSIS — I6521 Occlusion and stenosis of right carotid artery: Secondary | ICD-10-CM | POA: Diagnosis not present

## 2022-11-14 DIAGNOSIS — F419 Anxiety disorder, unspecified: Secondary | ICD-10-CM | POA: Diagnosis not present

## 2022-11-15 DIAGNOSIS — F419 Anxiety disorder, unspecified: Secondary | ICD-10-CM | POA: Diagnosis not present

## 2022-11-15 DIAGNOSIS — I10 Essential (primary) hypertension: Secondary | ICD-10-CM | POA: Diagnosis not present

## 2022-11-15 DIAGNOSIS — E1100 Type 2 diabetes mellitus with hyperosmolarity without nonketotic hyperglycemic-hyperosmolar coma (NKHHC): Secondary | ICD-10-CM | POA: Diagnosis not present

## 2022-11-15 DIAGNOSIS — I6521 Occlusion and stenosis of right carotid artery: Secondary | ICD-10-CM | POA: Diagnosis not present

## 2022-11-15 DIAGNOSIS — N185 Chronic kidney disease, stage 5: Secondary | ICD-10-CM | POA: Diagnosis not present

## 2022-11-15 DIAGNOSIS — D631 Anemia in chronic kidney disease: Secondary | ICD-10-CM | POA: Diagnosis not present

## 2022-11-21 DIAGNOSIS — F419 Anxiety disorder, unspecified: Secondary | ICD-10-CM | POA: Diagnosis not present

## 2022-11-21 DIAGNOSIS — I6521 Occlusion and stenosis of right carotid artery: Secondary | ICD-10-CM | POA: Diagnosis not present

## 2022-11-21 DIAGNOSIS — I10 Essential (primary) hypertension: Secondary | ICD-10-CM | POA: Diagnosis not present

## 2022-11-21 DIAGNOSIS — N185 Chronic kidney disease, stage 5: Secondary | ICD-10-CM | POA: Diagnosis not present

## 2022-11-21 DIAGNOSIS — E1100 Type 2 diabetes mellitus with hyperosmolarity without nonketotic hyperglycemic-hyperosmolar coma (NKHHC): Secondary | ICD-10-CM | POA: Diagnosis not present

## 2022-11-21 DIAGNOSIS — D631 Anemia in chronic kidney disease: Secondary | ICD-10-CM | POA: Diagnosis not present

## 2022-11-28 DIAGNOSIS — F419 Anxiety disorder, unspecified: Secondary | ICD-10-CM | POA: Diagnosis not present

## 2022-11-28 DIAGNOSIS — I6521 Occlusion and stenosis of right carotid artery: Secondary | ICD-10-CM | POA: Diagnosis not present

## 2022-11-28 DIAGNOSIS — D631 Anemia in chronic kidney disease: Secondary | ICD-10-CM | POA: Diagnosis not present

## 2022-11-28 DIAGNOSIS — N185 Chronic kidney disease, stage 5: Secondary | ICD-10-CM | POA: Diagnosis not present

## 2022-11-28 DIAGNOSIS — E1100 Type 2 diabetes mellitus with hyperosmolarity without nonketotic hyperglycemic-hyperosmolar coma (NKHHC): Secondary | ICD-10-CM | POA: Diagnosis not present

## 2022-11-28 DIAGNOSIS — I10 Essential (primary) hypertension: Secondary | ICD-10-CM | POA: Diagnosis not present

## 2022-12-03 DIAGNOSIS — D631 Anemia in chronic kidney disease: Secondary | ICD-10-CM | POA: Diagnosis not present

## 2022-12-03 DIAGNOSIS — Z515 Encounter for palliative care: Secondary | ICD-10-CM | POA: Diagnosis not present

## 2022-12-03 DIAGNOSIS — I10 Essential (primary) hypertension: Secondary | ICD-10-CM | POA: Diagnosis not present

## 2022-12-03 DIAGNOSIS — E1100 Type 2 diabetes mellitus with hyperosmolarity without nonketotic hyperglycemic-hyperosmolar coma (NKHHC): Secondary | ICD-10-CM | POA: Diagnosis not present

## 2022-12-03 DIAGNOSIS — N185 Chronic kidney disease, stage 5: Secondary | ICD-10-CM | POA: Diagnosis not present

## 2022-12-03 DIAGNOSIS — Z66 Do not resuscitate: Secondary | ICD-10-CM | POA: Diagnosis not present

## 2022-12-03 DIAGNOSIS — C73 Malignant neoplasm of thyroid gland: Secondary | ICD-10-CM | POA: Diagnosis not present

## 2022-12-03 DIAGNOSIS — F419 Anxiety disorder, unspecified: Secondary | ICD-10-CM | POA: Diagnosis not present

## 2022-12-03 DIAGNOSIS — I6521 Occlusion and stenosis of right carotid artery: Secondary | ICD-10-CM | POA: Diagnosis not present

## 2022-12-05 DIAGNOSIS — I6521 Occlusion and stenosis of right carotid artery: Secondary | ICD-10-CM | POA: Diagnosis not present

## 2022-12-05 DIAGNOSIS — E1100 Type 2 diabetes mellitus with hyperosmolarity without nonketotic hyperglycemic-hyperosmolar coma (NKHHC): Secondary | ICD-10-CM | POA: Diagnosis not present

## 2022-12-05 DIAGNOSIS — N185 Chronic kidney disease, stage 5: Secondary | ICD-10-CM | POA: Diagnosis not present

## 2022-12-05 DIAGNOSIS — F419 Anxiety disorder, unspecified: Secondary | ICD-10-CM | POA: Diagnosis not present

## 2022-12-05 DIAGNOSIS — I10 Essential (primary) hypertension: Secondary | ICD-10-CM | POA: Diagnosis not present

## 2022-12-05 DIAGNOSIS — D631 Anemia in chronic kidney disease: Secondary | ICD-10-CM | POA: Diagnosis not present

## 2022-12-10 DIAGNOSIS — E669 Obesity, unspecified: Secondary | ICD-10-CM | POA: Diagnosis not present

## 2022-12-10 DIAGNOSIS — J209 Acute bronchitis, unspecified: Secondary | ICD-10-CM | POA: Diagnosis not present

## 2022-12-10 DIAGNOSIS — Z6838 Body mass index (BMI) 38.0-38.9, adult: Secondary | ICD-10-CM | POA: Diagnosis not present

## 2022-12-12 DIAGNOSIS — N185 Chronic kidney disease, stage 5: Secondary | ICD-10-CM | POA: Diagnosis not present

## 2022-12-12 DIAGNOSIS — I10 Essential (primary) hypertension: Secondary | ICD-10-CM | POA: Diagnosis not present

## 2022-12-12 DIAGNOSIS — F419 Anxiety disorder, unspecified: Secondary | ICD-10-CM | POA: Diagnosis not present

## 2022-12-12 DIAGNOSIS — D631 Anemia in chronic kidney disease: Secondary | ICD-10-CM | POA: Diagnosis not present

## 2022-12-12 DIAGNOSIS — E1100 Type 2 diabetes mellitus with hyperosmolarity without nonketotic hyperglycemic-hyperosmolar coma (NKHHC): Secondary | ICD-10-CM | POA: Diagnosis not present

## 2022-12-12 DIAGNOSIS — I6521 Occlusion and stenosis of right carotid artery: Secondary | ICD-10-CM | POA: Diagnosis not present

## 2022-12-13 DIAGNOSIS — E1100 Type 2 diabetes mellitus with hyperosmolarity without nonketotic hyperglycemic-hyperosmolar coma (NKHHC): Secondary | ICD-10-CM | POA: Diagnosis not present

## 2022-12-13 DIAGNOSIS — F419 Anxiety disorder, unspecified: Secondary | ICD-10-CM | POA: Diagnosis not present

## 2022-12-13 DIAGNOSIS — I6521 Occlusion and stenosis of right carotid artery: Secondary | ICD-10-CM | POA: Diagnosis not present

## 2022-12-13 DIAGNOSIS — D631 Anemia in chronic kidney disease: Secondary | ICD-10-CM | POA: Diagnosis not present

## 2022-12-13 DIAGNOSIS — N185 Chronic kidney disease, stage 5: Secondary | ICD-10-CM | POA: Diagnosis not present

## 2022-12-13 DIAGNOSIS — I10 Essential (primary) hypertension: Secondary | ICD-10-CM | POA: Diagnosis not present

## 2022-12-14 DIAGNOSIS — I6521 Occlusion and stenosis of right carotid artery: Secondary | ICD-10-CM | POA: Diagnosis not present

## 2022-12-14 DIAGNOSIS — D631 Anemia in chronic kidney disease: Secondary | ICD-10-CM | POA: Diagnosis not present

## 2022-12-14 DIAGNOSIS — F419 Anxiety disorder, unspecified: Secondary | ICD-10-CM | POA: Diagnosis not present

## 2022-12-14 DIAGNOSIS — I10 Essential (primary) hypertension: Secondary | ICD-10-CM | POA: Diagnosis not present

## 2022-12-14 DIAGNOSIS — E1100 Type 2 diabetes mellitus with hyperosmolarity without nonketotic hyperglycemic-hyperosmolar coma (NKHHC): Secondary | ICD-10-CM | POA: Diagnosis not present

## 2022-12-14 DIAGNOSIS — N185 Chronic kidney disease, stage 5: Secondary | ICD-10-CM | POA: Diagnosis not present

## 2022-12-19 DIAGNOSIS — I10 Essential (primary) hypertension: Secondary | ICD-10-CM | POA: Diagnosis not present

## 2022-12-19 DIAGNOSIS — I6521 Occlusion and stenosis of right carotid artery: Secondary | ICD-10-CM | POA: Diagnosis not present

## 2022-12-19 DIAGNOSIS — E1100 Type 2 diabetes mellitus with hyperosmolarity without nonketotic hyperglycemic-hyperosmolar coma (NKHHC): Secondary | ICD-10-CM | POA: Diagnosis not present

## 2022-12-19 DIAGNOSIS — D631 Anemia in chronic kidney disease: Secondary | ICD-10-CM | POA: Diagnosis not present

## 2022-12-19 DIAGNOSIS — F419 Anxiety disorder, unspecified: Secondary | ICD-10-CM | POA: Diagnosis not present

## 2022-12-19 DIAGNOSIS — N185 Chronic kidney disease, stage 5: Secondary | ICD-10-CM | POA: Diagnosis not present

## 2022-12-26 DIAGNOSIS — F419 Anxiety disorder, unspecified: Secondary | ICD-10-CM | POA: Diagnosis not present

## 2022-12-26 DIAGNOSIS — D631 Anemia in chronic kidney disease: Secondary | ICD-10-CM | POA: Diagnosis not present

## 2022-12-26 DIAGNOSIS — I10 Essential (primary) hypertension: Secondary | ICD-10-CM | POA: Diagnosis not present

## 2022-12-26 DIAGNOSIS — E1100 Type 2 diabetes mellitus with hyperosmolarity without nonketotic hyperglycemic-hyperosmolar coma (NKHHC): Secondary | ICD-10-CM | POA: Diagnosis not present

## 2022-12-26 DIAGNOSIS — I6521 Occlusion and stenosis of right carotid artery: Secondary | ICD-10-CM | POA: Diagnosis not present

## 2022-12-26 DIAGNOSIS — N185 Chronic kidney disease, stage 5: Secondary | ICD-10-CM | POA: Diagnosis not present

## 2023-01-02 DIAGNOSIS — M542 Cervicalgia: Secondary | ICD-10-CM | POA: Diagnosis not present

## 2023-01-02 DIAGNOSIS — M9902 Segmental and somatic dysfunction of thoracic region: Secondary | ICD-10-CM | POA: Diagnosis not present

## 2023-01-02 DIAGNOSIS — I10 Essential (primary) hypertension: Secondary | ICD-10-CM | POA: Diagnosis not present

## 2023-01-02 DIAGNOSIS — I6521 Occlusion and stenosis of right carotid artery: Secondary | ICD-10-CM | POA: Diagnosis not present

## 2023-01-02 DIAGNOSIS — D631 Anemia in chronic kidney disease: Secondary | ICD-10-CM | POA: Diagnosis not present

## 2023-01-02 DIAGNOSIS — M546 Pain in thoracic spine: Secondary | ICD-10-CM | POA: Diagnosis not present

## 2023-01-02 DIAGNOSIS — E1100 Type 2 diabetes mellitus with hyperosmolarity without nonketotic hyperglycemic-hyperosmolar coma (NKHHC): Secondary | ICD-10-CM | POA: Diagnosis not present

## 2023-01-02 DIAGNOSIS — N185 Chronic kidney disease, stage 5: Secondary | ICD-10-CM | POA: Diagnosis not present

## 2023-01-02 DIAGNOSIS — F419 Anxiety disorder, unspecified: Secondary | ICD-10-CM | POA: Diagnosis not present

## 2023-01-02 DIAGNOSIS — M9901 Segmental and somatic dysfunction of cervical region: Secondary | ICD-10-CM | POA: Diagnosis not present

## 2023-01-03 DIAGNOSIS — I6521 Occlusion and stenosis of right carotid artery: Secondary | ICD-10-CM | POA: Diagnosis not present

## 2023-01-03 DIAGNOSIS — Z515 Encounter for palliative care: Secondary | ICD-10-CM | POA: Diagnosis not present

## 2023-01-03 DIAGNOSIS — E1100 Type 2 diabetes mellitus with hyperosmolarity without nonketotic hyperglycemic-hyperosmolar coma (NKHHC): Secondary | ICD-10-CM | POA: Diagnosis not present

## 2023-01-03 DIAGNOSIS — Z66 Do not resuscitate: Secondary | ICD-10-CM | POA: Diagnosis not present

## 2023-01-03 DIAGNOSIS — I10 Essential (primary) hypertension: Secondary | ICD-10-CM | POA: Diagnosis not present

## 2023-01-03 DIAGNOSIS — F419 Anxiety disorder, unspecified: Secondary | ICD-10-CM | POA: Diagnosis not present

## 2023-01-03 DIAGNOSIS — D631 Anemia in chronic kidney disease: Secondary | ICD-10-CM | POA: Diagnosis not present

## 2023-01-03 DIAGNOSIS — C73 Malignant neoplasm of thyroid gland: Secondary | ICD-10-CM | POA: Diagnosis not present

## 2023-01-03 DIAGNOSIS — N185 Chronic kidney disease, stage 5: Secondary | ICD-10-CM | POA: Diagnosis not present

## 2023-01-04 DIAGNOSIS — M546 Pain in thoracic spine: Secondary | ICD-10-CM | POA: Diagnosis not present

## 2023-01-04 DIAGNOSIS — M542 Cervicalgia: Secondary | ICD-10-CM | POA: Diagnosis not present

## 2023-01-04 DIAGNOSIS — M9902 Segmental and somatic dysfunction of thoracic region: Secondary | ICD-10-CM | POA: Diagnosis not present

## 2023-01-04 DIAGNOSIS — M9901 Segmental and somatic dysfunction of cervical region: Secondary | ICD-10-CM | POA: Diagnosis not present

## 2023-01-15 DIAGNOSIS — E119 Type 2 diabetes mellitus without complications: Secondary | ICD-10-CM | POA: Diagnosis not present

## 2023-01-15 DIAGNOSIS — Z79899 Other long term (current) drug therapy: Secondary | ICD-10-CM | POA: Diagnosis not present

## 2023-01-15 DIAGNOSIS — M1990 Unspecified osteoarthritis, unspecified site: Secondary | ICD-10-CM | POA: Diagnosis not present

## 2023-01-15 DIAGNOSIS — M109 Gout, unspecified: Secondary | ICD-10-CM | POA: Diagnosis not present

## 2023-01-15 DIAGNOSIS — I10 Essential (primary) hypertension: Secondary | ICD-10-CM | POA: Diagnosis not present

## 2023-01-15 DIAGNOSIS — M25561 Pain in right knee: Secondary | ICD-10-CM | POA: Diagnosis not present

## 2023-01-15 DIAGNOSIS — M179 Osteoarthritis of knee, unspecified: Secondary | ICD-10-CM | POA: Diagnosis not present

## 2023-01-15 DIAGNOSIS — M79643 Pain in unspecified hand: Secondary | ICD-10-CM | POA: Diagnosis not present

## 2023-01-15 DIAGNOSIS — M0589 Other rheumatoid arthritis with rheumatoid factor of multiple sites: Secondary | ICD-10-CM | POA: Diagnosis not present

## 2023-01-15 DIAGNOSIS — E790 Hyperuricemia without signs of inflammatory arthritis and tophaceous disease: Secondary | ICD-10-CM | POA: Diagnosis not present

## 2023-01-16 DIAGNOSIS — I6521 Occlusion and stenosis of right carotid artery: Secondary | ICD-10-CM | POA: Diagnosis not present

## 2023-01-16 DIAGNOSIS — F419 Anxiety disorder, unspecified: Secondary | ICD-10-CM | POA: Diagnosis not present

## 2023-01-16 DIAGNOSIS — N185 Chronic kidney disease, stage 5: Secondary | ICD-10-CM | POA: Diagnosis not present

## 2023-01-16 DIAGNOSIS — I10 Essential (primary) hypertension: Secondary | ICD-10-CM | POA: Diagnosis not present

## 2023-01-16 DIAGNOSIS — E1100 Type 2 diabetes mellitus with hyperosmolarity without nonketotic hyperglycemic-hyperosmolar coma (NKHHC): Secondary | ICD-10-CM | POA: Diagnosis not present

## 2023-01-16 DIAGNOSIS — D631 Anemia in chronic kidney disease: Secondary | ICD-10-CM | POA: Diagnosis not present

## 2023-01-23 DIAGNOSIS — M9901 Segmental and somatic dysfunction of cervical region: Secondary | ICD-10-CM | POA: Diagnosis not present

## 2023-01-23 DIAGNOSIS — M542 Cervicalgia: Secondary | ICD-10-CM | POA: Diagnosis not present

## 2023-01-23 DIAGNOSIS — M9902 Segmental and somatic dysfunction of thoracic region: Secondary | ICD-10-CM | POA: Diagnosis not present

## 2023-01-23 DIAGNOSIS — M546 Pain in thoracic spine: Secondary | ICD-10-CM | POA: Diagnosis not present

## 2023-01-24 DIAGNOSIS — F419 Anxiety disorder, unspecified: Secondary | ICD-10-CM | POA: Diagnosis not present

## 2023-01-24 DIAGNOSIS — E1100 Type 2 diabetes mellitus with hyperosmolarity without nonketotic hyperglycemic-hyperosmolar coma (NKHHC): Secondary | ICD-10-CM | POA: Diagnosis not present

## 2023-01-24 DIAGNOSIS — D631 Anemia in chronic kidney disease: Secondary | ICD-10-CM | POA: Diagnosis not present

## 2023-01-24 DIAGNOSIS — I10 Essential (primary) hypertension: Secondary | ICD-10-CM | POA: Diagnosis not present

## 2023-01-24 DIAGNOSIS — I6521 Occlusion and stenosis of right carotid artery: Secondary | ICD-10-CM | POA: Diagnosis not present

## 2023-01-24 DIAGNOSIS — Z79899 Other long term (current) drug therapy: Secondary | ICD-10-CM | POA: Diagnosis not present

## 2023-01-24 DIAGNOSIS — N185 Chronic kidney disease, stage 5: Secondary | ICD-10-CM | POA: Diagnosis not present

## 2023-01-28 DIAGNOSIS — N185 Chronic kidney disease, stage 5: Secondary | ICD-10-CM | POA: Diagnosis not present

## 2023-01-28 DIAGNOSIS — E872 Acidosis, unspecified: Secondary | ICD-10-CM | POA: Diagnosis not present

## 2023-01-28 DIAGNOSIS — N2581 Secondary hyperparathyroidism of renal origin: Secondary | ICD-10-CM | POA: Diagnosis not present

## 2023-01-28 DIAGNOSIS — K30 Functional dyspepsia: Secondary | ICD-10-CM | POA: Diagnosis not present

## 2023-01-28 DIAGNOSIS — R300 Dysuria: Secondary | ICD-10-CM | POA: Diagnosis not present

## 2023-01-28 DIAGNOSIS — D631 Anemia in chronic kidney disease: Secondary | ICD-10-CM | POA: Diagnosis not present

## 2023-01-28 DIAGNOSIS — E1122 Type 2 diabetes mellitus with diabetic chronic kidney disease: Secondary | ICD-10-CM | POA: Diagnosis not present

## 2023-01-28 DIAGNOSIS — N390 Urinary tract infection, site not specified: Secondary | ICD-10-CM | POA: Diagnosis not present

## 2023-01-28 DIAGNOSIS — I120 Hypertensive chronic kidney disease with stage 5 chronic kidney disease or end stage renal disease: Secondary | ICD-10-CM | POA: Diagnosis not present

## 2023-01-30 DIAGNOSIS — E1100 Type 2 diabetes mellitus with hyperosmolarity without nonketotic hyperglycemic-hyperosmolar coma (NKHHC): Secondary | ICD-10-CM | POA: Diagnosis not present

## 2023-01-30 DIAGNOSIS — I10 Essential (primary) hypertension: Secondary | ICD-10-CM | POA: Diagnosis not present

## 2023-01-30 DIAGNOSIS — F419 Anxiety disorder, unspecified: Secondary | ICD-10-CM | POA: Diagnosis not present

## 2023-01-30 DIAGNOSIS — N185 Chronic kidney disease, stage 5: Secondary | ICD-10-CM | POA: Diagnosis not present

## 2023-01-30 DIAGNOSIS — I6521 Occlusion and stenosis of right carotid artery: Secondary | ICD-10-CM | POA: Diagnosis not present

## 2023-01-30 DIAGNOSIS — D631 Anemia in chronic kidney disease: Secondary | ICD-10-CM | POA: Diagnosis not present

## 2023-02-01 DIAGNOSIS — I6521 Occlusion and stenosis of right carotid artery: Secondary | ICD-10-CM | POA: Diagnosis not present

## 2023-02-01 DIAGNOSIS — F419 Anxiety disorder, unspecified: Secondary | ICD-10-CM | POA: Diagnosis not present

## 2023-02-01 DIAGNOSIS — E1100 Type 2 diabetes mellitus with hyperosmolarity without nonketotic hyperglycemic-hyperosmolar coma (NKHHC): Secondary | ICD-10-CM | POA: Diagnosis not present

## 2023-02-01 DIAGNOSIS — C73 Malignant neoplasm of thyroid gland: Secondary | ICD-10-CM | POA: Diagnosis not present

## 2023-02-01 DIAGNOSIS — D631 Anemia in chronic kidney disease: Secondary | ICD-10-CM | POA: Diagnosis not present

## 2023-02-01 DIAGNOSIS — Z515 Encounter for palliative care: Secondary | ICD-10-CM | POA: Diagnosis not present

## 2023-02-01 DIAGNOSIS — Z66 Do not resuscitate: Secondary | ICD-10-CM | POA: Diagnosis not present

## 2023-02-01 DIAGNOSIS — N185 Chronic kidney disease, stage 5: Secondary | ICD-10-CM | POA: Diagnosis not present

## 2023-02-01 DIAGNOSIS — I10 Essential (primary) hypertension: Secondary | ICD-10-CM | POA: Diagnosis not present

## 2023-02-06 DIAGNOSIS — I10 Essential (primary) hypertension: Secondary | ICD-10-CM | POA: Diagnosis not present

## 2023-02-06 DIAGNOSIS — I6521 Occlusion and stenosis of right carotid artery: Secondary | ICD-10-CM | POA: Diagnosis not present

## 2023-02-06 DIAGNOSIS — N185 Chronic kidney disease, stage 5: Secondary | ICD-10-CM | POA: Diagnosis not present

## 2023-02-06 DIAGNOSIS — D631 Anemia in chronic kidney disease: Secondary | ICD-10-CM | POA: Diagnosis not present

## 2023-02-06 DIAGNOSIS — F419 Anxiety disorder, unspecified: Secondary | ICD-10-CM | POA: Diagnosis not present

## 2023-02-06 DIAGNOSIS — E1100 Type 2 diabetes mellitus with hyperosmolarity without nonketotic hyperglycemic-hyperosmolar coma (NKHHC): Secondary | ICD-10-CM | POA: Diagnosis not present

## 2023-02-13 ENCOUNTER — Encounter: Payer: Self-pay | Admitting: Internal Medicine

## 2023-02-13 DIAGNOSIS — J209 Acute bronchitis, unspecified: Secondary | ICD-10-CM | POA: Diagnosis not present

## 2023-02-13 DIAGNOSIS — H109 Unspecified conjunctivitis: Secondary | ICD-10-CM | POA: Diagnosis not present

## 2023-02-13 DIAGNOSIS — E669 Obesity, unspecified: Secondary | ICD-10-CM | POA: Diagnosis not present

## 2023-02-13 DIAGNOSIS — Z6838 Body mass index (BMI) 38.0-38.9, adult: Secondary | ICD-10-CM | POA: Diagnosis not present

## 2023-02-18 ENCOUNTER — Ambulatory Visit: Payer: Medicare Other | Admitting: Gastroenterology

## 2023-02-20 DIAGNOSIS — E1122 Type 2 diabetes mellitus with diabetic chronic kidney disease: Secondary | ICD-10-CM | POA: Diagnosis not present

## 2023-02-20 DIAGNOSIS — N400 Enlarged prostate without lower urinary tract symptoms: Secondary | ICD-10-CM | POA: Diagnosis not present

## 2023-02-20 DIAGNOSIS — R058 Other specified cough: Secondary | ICD-10-CM | POA: Diagnosis not present

## 2023-02-20 DIAGNOSIS — J432 Centrilobular emphysema: Secondary | ICD-10-CM | POA: Diagnosis not present

## 2023-02-20 DIAGNOSIS — N186 End stage renal disease: Secondary | ICD-10-CM | POA: Diagnosis not present

## 2023-02-20 DIAGNOSIS — D631 Anemia in chronic kidney disease: Secondary | ICD-10-CM | POA: Diagnosis not present

## 2023-02-20 DIAGNOSIS — I10 Essential (primary) hypertension: Secondary | ICD-10-CM | POA: Diagnosis not present

## 2023-02-20 DIAGNOSIS — M0589 Other rheumatoid arthritis with rheumatoid factor of multiple sites: Secondary | ICD-10-CM | POA: Diagnosis not present

## 2023-02-20 DIAGNOSIS — K1379 Other lesions of oral mucosa: Secondary | ICD-10-CM | POA: Diagnosis not present

## 2023-02-20 DIAGNOSIS — F5101 Primary insomnia: Secondary | ICD-10-CM | POA: Diagnosis not present

## 2023-02-20 DIAGNOSIS — E890 Postprocedural hypothyroidism: Secondary | ICD-10-CM | POA: Diagnosis not present

## 2023-02-20 DIAGNOSIS — Z0000 Encounter for general adult medical examination without abnormal findings: Secondary | ICD-10-CM | POA: Diagnosis not present

## 2023-02-23 ENCOUNTER — Other Ambulatory Visit: Payer: Self-pay | Admitting: Cardiology

## 2023-02-23 DIAGNOSIS — I10 Essential (primary) hypertension: Secondary | ICD-10-CM

## 2023-03-06 ENCOUNTER — Encounter: Payer: Self-pay | Admitting: Internal Medicine

## 2023-03-06 ENCOUNTER — Ambulatory Visit: Payer: Medicare Other | Admitting: Internal Medicine

## 2023-03-06 ENCOUNTER — Ambulatory Visit (INDEPENDENT_AMBULATORY_CARE_PROVIDER_SITE_OTHER): Payer: Medicare Other | Admitting: Internal Medicine

## 2023-03-06 VITALS — BP 128/67 | HR 59 | Temp 97.6°F | Ht 68.0 in | Wt 246.0 lb

## 2023-03-06 DIAGNOSIS — K219 Gastro-esophageal reflux disease without esophagitis: Secondary | ICD-10-CM

## 2023-03-06 DIAGNOSIS — R1319 Other dysphagia: Secondary | ICD-10-CM

## 2023-03-06 DIAGNOSIS — R197 Diarrhea, unspecified: Secondary | ICD-10-CM | POA: Diagnosis not present

## 2023-03-06 DIAGNOSIS — D638 Anemia in other chronic diseases classified elsewhere: Secondary | ICD-10-CM

## 2023-03-06 MED ORDER — PEG 3350-KCL-NA BICARB-NACL 420 G PO SOLR: 4000.0000 mL | Freq: Once | ORAL | 0 refills | Status: AC

## 2023-03-06 NOTE — Progress Notes (Signed)
Primary Care Physician:  [name], DO Primary Gastroenterologist:  Dr. Abbey Chatters  Chief Complaint  Patient presents with   Diarrhea    Has diarrhea every day for past two years. Has tried imodium.    Gastroesophageal Reflux    Patient reports a lot of reflux and regurgitations.     HPI:   [name]. is a 72 y.o. male who presents to clinic today by referral from his nephrologist Anice Paganini for evaluation.  Multiple GI complaints for me today.  Notes chronic acid reflux which is relatively well-controlled on pantoprazole 20 mg daily.  Main issue is bloating as well as esophageal dysphagia.  Feels as though food will get stuck in his substernal region.  Sometimes regurgitates food out right.  States this is embarrassing when it happens at restaurants.  No previous upper endoscopy.  Symptoms moderate, constant.  Rare issues with liquids.  No chronic NSAID use.  No history of PUD or H. pylori that he is aware of.  Does have anemia of chronic disease related to his CKD.  Most recent hemoglobin 9.2.  Denies any melena hematochezia.  States his last colonoscopy was nearly 20 years ago.  States he had Cologuard testing which was negative though this was greater than 3 years ago.  No family history of colorectal malignancy.  Does note chronically loose stools.  Has on average 4-5 loose bowel movements daily.  This has been going on for 2 years.  No abdominal pain or cramping. Has taken Imodium sporadically which does not help.  Past Medical History:  Diagnosis Date   Asthma    Chronic kidney disease    stage 5   Diabetes mellitus without complication    Hyperlipidemia    Hypertension    Hypothyroidism    Obesity    Pituitary macroadenoma    Rheumatoid arthritis    Sleep apnea     Past Surgical History:  Procedure Laterality Date   Arthroscopic knee surgery Right 1998   Gamma knife surgery  2011   HAND SURGERY     THYROIDECTOMY     TONSILLECTOMY     TUMOR  REMOVAL     Pituitary    Current Outpatient Medications  Medication Sig Dispense Refill   acetaminophen (TYLENOL) 650 MG CR tablet Take 1,300 mg by mouth every 8 (eight) hours as needed for pain.     albuterol (PROVENTIL HFA;VENTOLIN HFA) 108 (90 Base) MCG/ACT inhaler Inhale 1-2 puffs into the lungs every 6 (six) hours as needed for wheezing or shortness of breath.     ALPRAZolam (XANAX) 1 MG tablet Take 1-2 mg by mouth every 4 (four) hours as needed.     cyclobenzaprine (FLEXERIL) 10 MG tablet Take 10 mg by mouth at bedtime.     doxazosin (CARDURA) 2 MG tablet Take 2 mg by mouth 2 (two) times daily.      EPINEPHrine 0.3 mg/0.3 mL IJ SOAJ injection Inject 0.3 mLs (0.3 mg total) into the muscle as needed for up to 2 doses. (Patient taking differently: Inject 0.3 mg into the muscle as needed for anaphylaxis.) 2 Device 0   escitalopram (LEXAPRO) 10 MG tablet Take 10 mg by mouth daily.     ferrous sulfate 325 (65 FE) MG tablet Take 325 mg by mouth daily.     finasteride (PROSCAR) 5 MG tablet Take 5 mg by mouth daily.     furosemide (LASIX) 80 MG tablet Take 80 mg by mouth 2 (two) times  daily.     isosorbide dinitrate (ISORDIL) 30 MG tablet TAKE 2 TABLETS BY MOUTH 3 TIMES A DAY 540 tablet 0   labetalol (NORMODYNE) 200 MG tablet TAKE 1 TABLET BY MOUTH TWICE A DAY 180 tablet 1   leflunomide (ARAVA) 20 MG tablet Take 20 mg by mouth daily.     levothyroxine (SYNTHROID) 175 MCG tablet Take 175 mcg by mouth daily before breakfast.     pantoprazole (PROTONIX) 20 MG tablet Take 20 mg by mouth daily.     predniSONE (DELTASONE) 5 MG tablet Take 5 mg by mouth daily as needed (arthritis).      Pseudoephedrine-guaiFENesin (MUCINEX D PO) Take by mouth. Takes as needed     QUEtiapine (SEROQUEL) 25 MG tablet Take 25 mg by mouth at bedtime. Take one tablet at bedtime with 50mg  dose for a total of 75mg  daily     spironolactone (ALDACTONE) 25 MG tablet Take 25 mg by mouth daily.     No current  facility-administered medications for this visit.    Allergies as of 03/06/2023 - Review Complete 03/06/2023  Allergen Reaction Noted   Atorvastatin Other (See Comments) 08/05/2018   Farxiga [dapagliflozin] Other (See Comments) XX123456   Folic acid Hives XX123456   Humira [adalimumab] Other (See Comments) 08/05/2018   Metformin and related Other (See Comments) 08/05/2018   Plavix [clopidogrel bisulfate] Hives 08/05/2018   Simvastatin Other (See Comments) 08/05/2018    Family History  Problem Relation Age of Onset   Hypertension Mother    Emphysema Father    Healthy Sister    Healthy Brother    Healthy Sister     Social History   Socioeconomic History   Marital status: Married    Spouse name: Not on file   Number of children: 2   Years of education: Not on file   Highest education level: Not on file  Occupational History   Not on file  Tobacco Use   Smoking status: Every Day    Packs/day: 1.00    Years: 40.00    Additional pack years: 0.00    Total pack years: 40.00    Types: Cigarettes    Passive exposure: Current   Smokeless tobacco: Never  Vaping Use   Vaping Use: Never used  Substance and Sexual Activity   Alcohol use: Yes    Comment: occasionally   Drug use: Not Currently    Types: Marijuana   Sexual activity: Not on file  Other Topics Concern   Not on file  Social History Narrative   Not on file   Social Determinants of Health   Financial Resource Strain: Not on file  Food Insecurity: Not on file  Transportation Needs: Not on file  Physical Activity: Not on file  Stress: Not on file  Social Connections: Not on file  Intimate Partner Violence: Not on file    Subjective: Review of Systems  Constitutional:  Negative for chills and fever.  HENT:  Negative for congestion and hearing loss.   Eyes:  Negative for blurred vision and double vision.  Respiratory:  Negative for cough and shortness of breath.   Cardiovascular:  Negative for chest  pain and palpitations.  Gastrointestinal:  Positive for diarrhea and heartburn. Negative for abdominal pain, blood in stool, constipation, melena and vomiting.       Dysphagia  Genitourinary:  Negative for dysuria and urgency.  Musculoskeletal:  Negative for joint pain and myalgias.  Skin:  Negative for itching and rash.  Neurological:  Negative for dizziness and headaches.  Psychiatric/Behavioral:  Negative for depression. The patient is not nervous/anxious.        Objective: BP 128/67 (BP Location: Left Arm, Patient Position: Sitting, Cuff Size: Large)   Pulse (!) 59   Temp 97.6 F (36.4 C) (Oral)   Ht 5\' 8"  (1.727 m)   Wt 246 lb (111.6 kg)   BMI 37.40 kg/m  Physical Exam Constitutional:      Appearance: Normal appearance.  HENT:     Head: Normocephalic and atraumatic.  Eyes:     Extraocular Movements: Extraocular movements intact.     Conjunctiva/sclera: Conjunctivae normal.  Cardiovascular:     Rate and Rhythm: Normal rate and regular rhythm.  Pulmonary:     Effort: Pulmonary effort is normal.     Breath sounds: Normal breath sounds.  Abdominal:     General: Bowel sounds are normal.     Palpations: Abdomen is soft.  Musculoskeletal:        General: Normal range of motion.     Cervical back: Normal range of motion and neck supple.  Skin:    General: Skin is warm.  Neurological:     General: No focal deficit present.     Mental Status: He is alert and oriented to person, place, and time.  Psychiatric:        Mood and Affect: Mood normal.        Behavior: Behavior normal.      Assessment: *Chronic GERD *Esophageal dysphagia  *Chronic diarrhea *Anemia of chronic disease  Plan: Will schedule for EGD with possible dilation to evaluate for peptic ulcer disease, esophagitis, gastritis, H. Pylori, duodenitis, or other. Will also evaluate for esophageal stricture, Schatzki's ring, esophageal web or other.   At the same time will perform colonoscopy with biopsies  to evaluate chronic diarrhea, anemia.   The risks including infection, bleed, or perforation as well as benefits, limitations, alternatives and imponderables have been reviewed with the patient. Potential for esophageal dilation, biopsy, etc. have also been reviewed.  Questions have been answered. All parties agreeable.  Will order blood work today including celiac, TSH, CRP, ESR, CMP, CBC  Continue on pantoprazole 20 mg daily for now, may make adjustments pending endoscopic findings.   Continue imodium for diarrhea for now.   Thank you Anice Paganini for the kind referral.    03/06/2023 9:07 AM   Disclaimer: This note was dictated with voice recognition software. Similar sounding words can inadvertently be transcribed and may not be corrected upon review.

## 2023-03-06 NOTE — Patient Instructions (Signed)
I am going to check blood work today at [facility] including CBC, CMP, inflammatory markers ESR and CRP, celiac testing, thyroid testing.  We will schedule you for upper endoscopy to further evaluate your regurgitation and difficulty with food getting stuck.  I may elect to stretch your esophagus depending on findings.  At the same time we will perform colonoscopy to further evaluate your anemia, diarrhea, and for colon cancer screening purposes.  Continue on pantoprazole daily.  Imodium is safe to take.  You can take this up to 3 times a day for your diarrhea.  It was very nice meeting both you today.  Dr. Abbey Chatters

## 2023-03-06 NOTE — Addendum Note (Signed)
Addended by: Vicente Males on: 03/06/2023 09:50 AM   Modules accepted: Orders

## 2023-03-07 ENCOUNTER — Telehealth: Payer: Self-pay

## 2023-03-07 LAB — CBC

## 2023-03-07 LAB — COMPREHENSIVE METABOLIC PANEL
AG Ratio: 1.3 (calc) (ref 1.0–2.5)
ALT: 8 U/L — ABNORMAL LOW (ref 9–46)
AST: 11 U/L (ref 10–35)
Albumin: 3.4 g/dL — ABNORMAL LOW (ref 3.6–5.1)
Alkaline phosphatase (APISO): 95 U/L (ref 35–144)
BUN/Creatinine Ratio: 14 (calc) (ref 6–22)
BUN: 79 mg/dL — ABNORMAL HIGH (ref 7–25)
CO2: 18 mmol/L — ABNORMAL LOW (ref 20–32)
Calcium: 8.4 mg/dL — ABNORMAL LOW (ref 8.6–10.3)
Chloride: 112 mmol/L — ABNORMAL HIGH (ref 98–110)
Creat: 5.53 mg/dL — ABNORMAL HIGH (ref 0.70–1.35)
Globulin: 2.6 g/dL (calc) (ref 1.9–3.7)
Glucose, Bld: 101 mg/dL (ref 65–139)
Potassium: 4.2 mmol/L (ref 3.5–5.3)
Sodium: 143 mmol/L (ref 135–146)
Total Bilirubin: 0.3 mg/dL (ref 0.2–1.2)
Total Protein: 6 g/dL — ABNORMAL LOW (ref 6.1–8.1)

## 2023-03-07 LAB — CELIAC DISEASE PANEL
(tTG) Ab, IgA: <1 U/mL
(tTG) Ab, IgG: <1 U/mL
Gliadin IgA: <1 U/mL
Gliadin IgG: <1 U/mL
Immunoglobulin A: 341 mg/dL — ABNORMAL HIGH (ref 70–320)

## 2023-03-07 LAB — SEDIMENTATION RATE

## 2023-03-07 LAB — TSH+FREE T4: TSH W/REFLEX TO FT4: 2.25 mIU/L (ref 0.40–4.50)

## 2023-03-07 LAB — C-REACTIVE PROTEIN: CRP: 6.5 mg/L (ref <8.0)

## 2023-03-07 NOTE — Telephone Encounter (Signed)
Documentation from [facility] labs where the CBC and Sed Rate were cancelled due to the blood clotting. Documentation from [facility] on your desk

## 2023-03-18 ENCOUNTER — Ambulatory Visit: Payer: Medicare Other | Admitting: Gastroenterology

## 2023-03-19 DIAGNOSIS — E119 Type 2 diabetes mellitus without complications: Secondary | ICD-10-CM | POA: Diagnosis not present

## 2023-03-19 DIAGNOSIS — H40053 Ocular hypertension, bilateral: Secondary | ICD-10-CM | POA: Diagnosis not present

## 2023-03-19 DIAGNOSIS — G4730 Sleep apnea, unspecified: Secondary | ICD-10-CM | POA: Diagnosis not present

## 2023-03-19 DIAGNOSIS — H10413 Chronic giant papillary conjunctivitis, bilateral: Secondary | ICD-10-CM | POA: Diagnosis not present

## 2023-03-19 DIAGNOSIS — H35363 Drusen (degenerative) of macula, bilateral: Secondary | ICD-10-CM | POA: Diagnosis not present

## 2023-03-19 DIAGNOSIS — H04123 Dry eye syndrome of bilateral lacrimal glands: Secondary | ICD-10-CM | POA: Diagnosis not present

## 2023-03-19 DIAGNOSIS — H11823 Conjunctivochalasis, bilateral: Secondary | ICD-10-CM | POA: Diagnosis not present

## 2023-03-19 DIAGNOSIS — H2513 Age-related nuclear cataract, bilateral: Secondary | ICD-10-CM | POA: Diagnosis not present

## 2023-03-27 ENCOUNTER — Telehealth (INDEPENDENT_AMBULATORY_CARE_PROVIDER_SITE_OTHER): Payer: Self-pay | Admitting: Internal Medicine

## 2023-03-27 NOTE — Telephone Encounter (Signed)
Pt wife returned call and verbalized understanding.  

## 2023-03-27 NOTE — Telephone Encounter (Signed)
Left message with pre op appt information at [phone]

## 2023-04-04 DIAGNOSIS — H2511 Age-related nuclear cataract, right eye: Secondary | ICD-10-CM | POA: Diagnosis not present

## 2023-04-04 HISTORY — PX: CATARACT EXTRACTION: SUR2

## 2023-04-09 ENCOUNTER — Encounter (HOSPITAL_COMMUNITY): Payer: Self-pay

## 2023-04-09 ENCOUNTER — Other Ambulatory Visit: Payer: Self-pay

## 2023-04-09 ENCOUNTER — Encounter (HOSPITAL_COMMUNITY)
Admission: RE | Admit: 2023-04-09 | Discharge: 2023-04-09 | Disposition: A | Discharge status: Home / Self Care | Payer: Medicare Other | Source: Ambulatory Visit | Attending: Internal Medicine | Admitting: Internal Medicine

## 2023-04-09 NOTE — Patient Instructions (Signed)
   Your procedure is scheduled on: 04/12/2023  Report to [facility] Main Entrance at    6:00 AM.  Call this number if you have problems the morning of surgery: [phone]   Remember:              Follow Directions on the letter you received from Your Physician's office regarding the Bowel Prep              No Smoking the day of Procedure :   Take these medicines the morning of surgery with A SIP OF WATER: isosorbide, lexapro, labetalol, and pantoprazole  Xanax, flexeril and/or finasteride if needed   Do not wear jewelry, make-up or nail polish.    Do not bring valuables to the hospital.  Contacts, dentures or bridgework may not be worn into surgery.  .   Patients discharged the day of surgery will not be allowed to drive home.     Colonoscopy, Adult, Care After This sheet gives you information about how to care for yourself after your procedure. Your health care provider may also give you more specific instructions. If you have problems or questions, contact your health care provider. What can I expect after the procedure? After the procedure, it is common to have: A small amount of blood in your stool for 24 hours after the procedure. Some gas. Mild abdominal cramping or bloating.  Follow these instructions at home: General instructions  For the first 24 hours after the procedure: Do not drive or use machinery. Do not sign important documents. Do not drink alcohol. Do your regular daily activities at a slower pace than normal. Eat soft, easy-to-digest foods. Rest often. Take over-the-counter or prescription medicines only as told by your health care provider. It is up to you to get the results of your procedure. Ask your health care provider, or the department performing the procedure, when your results will be ready. Relieving cramping and bloating Try walking around when you have cramps or feel bloated. Apply heat to your abdomen as told by your health care provider.  Use a heat source that your health care provider recommends, such as a moist heat pack or a heating pad. Place a towel between your skin and the heat source. Leave the heat on for 20-30 minutes. Remove the heat if your skin turns bright red. This is especially important if you are unable to feel pain, heat, or cold. You may have a greater risk of getting burned. Eating and drinking Drink enough fluid to keep your urine clear or pale yellow. Resume your normal diet as instructed by your health care provider. Avoid heavy or fried foods that are hard to digest. Avoid drinking alcohol for as long as instructed by your health care provider. Contact a health care provider if: You have blood in your stool 2-3 days after the procedure. Get help right away if: You have more than a small spotting of blood in your stool. You pass large blood clots in your stool. Your abdomen is swollen. You have nausea or vomiting. You have a fever. You have increasing abdominal pain that is not relieved with medicine. This information is not intended to replace advice given to you by your health care provider. Make sure you discuss any questions you have with your health care provider. Document Released: 07/03/2004 Document Revised: 08/13/2016 Document Reviewed: 01/31/2016 Elsevier Interactive Patient Education  [employer].

## 2023-04-12 ENCOUNTER — Ambulatory Visit (HOSPITAL_BASED_OUTPATIENT_CLINIC_OR_DEPARTMENT_OTHER): Payer: Medicare Other | Admitting: Anesthesiology

## 2023-04-12 ENCOUNTER — Encounter (HOSPITAL_COMMUNITY): Payer: Self-pay

## 2023-04-12 ENCOUNTER — Ambulatory Visit (HOSPITAL_COMMUNITY)
Admission: RE | Admit: 2023-04-12 | Discharge: 2023-04-12 | Disposition: A | Discharge status: Home / Self Care | Payer: Medicare Other | Attending: Internal Medicine | Admitting: Internal Medicine

## 2023-04-12 ENCOUNTER — Ambulatory Visit (HOSPITAL_COMMUNITY): Payer: Medicare Other | Admitting: Anesthesiology

## 2023-04-12 ENCOUNTER — Encounter (HOSPITAL_COMMUNITY)
Admission: RE | Disposition: A | Discharge status: Home / Self Care | Payer: Self-pay | Source: Home / Self Care | Attending: Internal Medicine

## 2023-04-12 DIAGNOSIS — N189 Chronic kidney disease, unspecified: Secondary | ICD-10-CM | POA: Insufficient documentation

## 2023-04-12 DIAGNOSIS — K5730 Diverticulosis of large intestine without perforation or abscess without bleeding: Secondary | ICD-10-CM | POA: Insufficient documentation

## 2023-04-12 DIAGNOSIS — I129 Hypertensive chronic kidney disease with stage 1 through stage 4 chronic kidney disease, or unspecified chronic kidney disease: Secondary | ICD-10-CM | POA: Insufficient documentation

## 2023-04-12 DIAGNOSIS — K635 Polyp of colon: Secondary | ICD-10-CM | POA: Diagnosis not present

## 2023-04-12 DIAGNOSIS — E1122 Type 2 diabetes mellitus with diabetic chronic kidney disease: Secondary | ICD-10-CM | POA: Diagnosis not present

## 2023-04-12 DIAGNOSIS — K529 Noninfective gastroenteritis and colitis, unspecified: Secondary | ICD-10-CM | POA: Insufficient documentation

## 2023-04-12 DIAGNOSIS — R1310 Dysphagia, unspecified: Secondary | ICD-10-CM | POA: Insufficient documentation

## 2023-04-12 DIAGNOSIS — D123 Benign neoplasm of transverse colon: Secondary | ICD-10-CM | POA: Insufficient documentation

## 2023-04-12 DIAGNOSIS — K648 Other hemorrhoids: Secondary | ICD-10-CM

## 2023-04-12 DIAGNOSIS — Z6838 Body mass index (BMI) 38.0-38.9, adult: Secondary | ICD-10-CM | POA: Insufficient documentation

## 2023-04-12 DIAGNOSIS — R12 Heartburn: Secondary | ICD-10-CM

## 2023-04-12 DIAGNOSIS — I10 Essential (primary) hypertension: Secondary | ICD-10-CM | POA: Diagnosis not present

## 2023-04-12 DIAGNOSIS — D509 Iron deficiency anemia, unspecified: Secondary | ICD-10-CM | POA: Insufficient documentation

## 2023-04-12 DIAGNOSIS — F17210 Nicotine dependence, cigarettes, uncomplicated: Secondary | ICD-10-CM | POA: Insufficient documentation

## 2023-04-12 DIAGNOSIS — G4730 Sleep apnea, unspecified: Secondary | ICD-10-CM | POA: Insufficient documentation

## 2023-04-12 DIAGNOSIS — E039 Hypothyroidism, unspecified: Secondary | ICD-10-CM | POA: Insufficient documentation

## 2023-04-12 DIAGNOSIS — J45909 Unspecified asthma, uncomplicated: Secondary | ICD-10-CM | POA: Insufficient documentation

## 2023-04-12 HISTORY — PX: COLONOSCOPY WITH PROPOFOL: SHX5780

## 2023-04-12 HISTORY — PX: POLYPECTOMY: SHX5525

## 2023-04-12 HISTORY — PX: BALLOON DILATION: SHX5330

## 2023-04-12 HISTORY — PX: ESOPHAGOGASTRODUODENOSCOPY (EGD) WITH PROPOFOL: SHX5813

## 2023-04-12 LAB — GLUCOSE, CAPILLARY
Glucose-Capillary: 76 mg/dL (ref 70–99)
Glucose-Capillary: 107 mg/dL — ABNORMAL HIGH (ref 70–99)

## 2023-04-12 SURGERY — COLONOSCOPY WITH PROPOFOL
Anesthesia: General

## 2023-04-12 MED ORDER — PROPOFOL 500 MG/50ML IV EMUL
INTRAVENOUS | Status: DC | PRN
  Administered 2023-04-12: 150 ug/kg/min via INTRAVENOUS

## 2023-04-12 MED ORDER — STERILE WATER FOR IRRIGATION IR SOLN
Status: DC | PRN
  Administered 2023-04-12: 60 mL

## 2023-04-12 MED ORDER — DEXTROSE 50 % IV SOLN
INTRAVENOUS | Status: AC
  Filled 2023-04-12: qty 50

## 2023-04-12 MED ORDER — LIDOCAINE HCL (CARDIAC) PF 100 MG/5ML IV SOSY
PREFILLED_SYRINGE | INTRAVENOUS | Status: DC | PRN
  Administered 2023-04-12: 50 mg via INTRATRACHEAL

## 2023-04-12 MED ORDER — DEXTROSE 50 % IV SOLN
25.0000 mL | Freq: Once | INTRAVENOUS | Status: AC
  Administered 2023-04-12: 25 mL via INTRAVENOUS

## 2023-04-12 MED ORDER — PROPOFOL 10 MG/ML IV BOLUS
INTRAVENOUS | Status: DC | PRN
  Administered 2023-04-12: 80 mg via INTRAVENOUS

## 2023-04-12 MED ORDER — LACTATED RINGERS IV SOLN: INTRAVENOUS | Status: DC

## 2023-04-12 NOTE — H&P (Signed)
Primary Care Physician:  Irena Reichmann, DO Primary Gastroenterologist:  Dr. Marletta Lor  Pre-Procedure History & Physical: HPI:  [name]. is a 72 y.o. male is here for an EGD with possible dilation to be performed for GERD, dysphagia, and colonoscopy for anemia and chronic diarrhea.  Past Medical History:  Diagnosis Date   Asthma    Chronic kidney disease    stage 5   Diabetes mellitus without complication (HCC)    Hyperlipidemia    Hypertension    Hypothyroidism    Obesity    Pituitary macroadenoma (HCC)    Rheumatoid arthritis (HCC)    Sleep apnea     Past Surgical History:  Procedure Laterality Date   Arthroscopic knee surgery Right 1998   CATARACT EXTRACTION Right 04/04/2023   Gamma knife surgery  2011   HAND SURGERY     THYROIDECTOMY     TONSILLECTOMY     TUMOR REMOVAL     Pituitary    Prior to Admission medications   Medication Sig Start Date End Date Taking? Authorizing Provider  acetaminophen (TYLENOL) 650 MG CR tablet Take 1,300 mg by mouth every 8 (eight) hours as needed for pain.   Yes [provider]  albuterol (PROVENTIL HFA;VENTOLIN HFA) 108 (90 Base) MCG/ACT inhaler Inhale 1-2 puffs into the lungs every 6 (six) hours as needed for wheezing or shortness of breath.   Yes [provider]  ALPRAZolam Prudy Feeler) 1 MG tablet Take 1-2 mg by mouth every 4 (four) hours as needed. 06/20/22  Yes [provider]  cyclobenzaprine (FLEXERIL) 10 MG tablet Take 10 mg by mouth at bedtime. 03/06/21  Yes [provider]  doxazosin (CARDURA) 2 MG tablet Take 2 mg by mouth 2 (two) times daily.    Yes [provider]  escitalopram (LEXAPRO) 10 MG tablet Take 10 mg by mouth daily.   Yes [provider]  ferrous sulfate 325 (65 FE) MG tablet Take 325 mg by mouth daily. 06/20/21  Yes [provider]  finasteride (PROSCAR) 5 MG tablet Take 5 mg by mouth daily.   Yes [provider]  furosemide (LASIX) 80 MG tablet  Take 80 mg by mouth 2 (two) times daily. 06/18/20  Yes [provider]  isosorbide dinitrate (ISORDIL) 30 MG tablet TAKE 2 TABLETS BY MOUTH 3 TIMES A DAY 02/25/23  Yes Tolia, Sunit, DO  labetalol (NORMODYNE) 200 MG tablet TAKE 1 TABLET BY MOUTH TWICE A DAY 10/03/22  Yes Tolia, Sunit, DO  leflunomide (ARAVA) 20 MG tablet Take 20 mg by mouth daily. 12/04/20  Yes [provider]  levothyroxine (SYNTHROID) 175 MCG tablet Take 175 mcg by mouth daily before breakfast.   Yes [provider]  pantoprazole (PROTONIX) 20 MG tablet Take 20 mg by mouth daily. 11/09/21  Yes [provider]  predniSONE (DELTASONE) 5 MG tablet Take 5 mg by mouth daily as needed (arthritis).  06/02/20  Yes [provider]  Pseudoephedrine-guaiFENesin (MUCINEX D PO) Take by mouth. Takes as needed   Yes [provider]  QUEtiapine (SEROQUEL) 25 MG tablet Take 25 mg by mouth at bedtime. Take one tablet at bedtime with 50mg  dose for a total of 75mg  daily   Yes [provider]  spironolactone (ALDACTONE) 25 MG tablet Take 25 mg by mouth daily. 03/31/21  Yes [provider]  EPINEPHrine 0.3 mg/0.3 mL IJ SOAJ injection Inject 0.3 mLs (0.3 mg total) into the muscle as needed for up to 2 doses. Patient taking  differently: Inject 0.3 mg into the muscle as needed for anaphylaxis. 08/06/18   Nash Dimmer, MD    Allergies as of 03/06/2023 - Review Complete 03/06/2023  Allergen Reaction Noted   Atorvastatin Other (See Comments) 08/05/2018   Farxiga [dapagliflozin] Other (See Comments) 08/05/2018   Folic acid Hives 08/05/2018   Humira [adalimumab] Other (See Comments) 08/05/2018   Metformin and related Other (See Comments) 08/05/2018   Plavix [clopidogrel bisulfate] Hives 08/05/2018   Simvastatin Other (See Comments) 08/05/2018    Family History  Problem Relation Age of Onset   Hypertension Mother    Emphysema Father    Healthy Sister    Healthy Brother    Healthy  Sister     Social History   Socioeconomic History   Marital status: Married    Spouse name: Not on file   Number of children: 2   Years of education: Not on file   Highest education level: Not on file  Occupational History   Not on file  Tobacco Use   Smoking status: Every Day    Packs/day: 1.00    Years: 40.00    Additional pack years: 0.00    Total pack years: 40.00    Types: Cigarettes    Passive exposure: Current   Smokeless tobacco: Never  Vaping Use   Vaping Use: Never used  Substance and Sexual Activity   Alcohol use: Yes    Comment: occasionally   Drug use: Not Currently    Types: Marijuana   Sexual activity: Not on file  Other Topics Concern   Not on file  Social History Narrative   Not on file   Social Determinants of Health   Financial Resource Strain: Not on file  Food Insecurity: Not on file  Transportation Needs: Not on file  Physical Activity: Not on file  Stress: Not on file  Social Connections: Not on file  Intimate Partner Violence: Not on file    Review of Systems: General: Negative for fever, chills, fatigue, weakness. Eyes: Negative for vision changes.  ENT: Negative for hoarseness, difficulty swallowing , nasal congestion. CV: Negative for chest pain, angina, palpitations, dyspnea on exertion, peripheral edema.  Respiratory: Negative for dyspnea at rest, dyspnea on exertion, cough, sputum, wheezing.  GI: See history of present illness. GU:  Negative for dysuria, hematuria, urinary incontinence, urinary frequency, nocturnal urination.  MS: Negative for joint pain, low back pain.  Derm: Negative for rash or itching.  Neuro: Negative for weakness, abnormal sensation, seizure, frequent headaches, memory loss, confusion.  Psych: Negative for anxiety, depression Endo: Negative for unusual weight change.  Heme: Negative for bruising or bleeding. Allergy: Negative for rash or hives.  Physical Exam: Vital signs in last 24 hours: Temp:   [98.2 F (36.8 C)] 98.2 F (36.8 C) (05/10 0715) Pulse Rate:  [63] 63 (05/10 0715) BP: (182-199)/(79-86) 182/86 (05/10 0731) SpO2:  [100 %] 100 % (05/10 0715)   General:   Alert,  Well-developed, well-nourished, pleasant and cooperative in NAD Head:  Normocephalic and atraumatic. Eyes:  Sclera clear, no icterus.   Conjunctiva pink. Ears:  Normal auditory acuity. Nose:  No deformity, discharge,  or lesions. Msk:  Symmetrical without gross deformities. Normal posture. Extremities:  Without clubbing or edema. Neurologic:  Alert and  oriented x4;  grossly normal neurologically. Skin:  Intact without significant lesions or rashes. Psych:  Alert and cooperative. Normal mood and affect.   Impression/Plan: [name]. is here for an EGD with possible dilation to  be performed for GERD, dysphagia, and colonoscopy for anemia and chronic diarrhea.   Risks, benefits, limitations, imponderables and alternatives regarding EGD have been reviewed with the patient. Questions have been answered. All parties agreeable.

## 2023-04-12 NOTE — Anesthesia Preprocedure Evaluation (Signed)
Anesthesia Evaluation  Patient identified by MRN, date of birth, ID band Patient awake    Reviewed: Allergy & Precautions, H&P , NPO status , Patient's Chart, lab work & pertinent test results, reviewed documented beta blocker date and time   Airway Mallampati: II  TM Distance: >3 FB Neck ROM: full    Dental no notable dental hx.    Pulmonary neg pulmonary ROS, asthma , sleep apnea , Current Smoker   Pulmonary exam normal breath sounds clear to auscultation       Cardiovascular Exercise Tolerance: Good hypertension, negative cardio ROS  Rhythm:regular Rate:Normal     Neuro/Psych negative neurological ROS  negative psych ROS   GI/Hepatic negative GI ROS, Neg liver ROS,,,  Endo/Other  diabetesHypothyroidism  Morbid obesity  Renal/GU Renal diseasenegative Renal ROS  negative genitourinary   Musculoskeletal  (+) Arthritis ,    Abdominal   Peds  Hematology negative hematology ROS (+)   Anesthesia Other Findings   Reproductive/Obstetrics negative OB ROS                             Anesthesia Physical Anesthesia Plan  ASA: 3  Anesthesia Plan: General   Post-op Pain Management:    Induction:   PONV Risk Score and Plan: Propofol infusion  Airway Management Planned:   Additional Equipment:   Intra-op Plan:   Post-operative Plan:   Informed Consent: I have reviewed the patients History and Physical, chart, labs and discussed the procedure including the risks, benefits and alternatives for the proposed anesthesia with the patient or authorized representative who has indicated his/her understanding and acceptance.     Dental Advisory Given  Plan Discussed with: CRNA  Anesthesia Plan Comments:        Anesthesia Quick Evaluation

## 2023-04-12 NOTE — Transfer of Care (Signed)
Immediate Anesthesia Transfer of Care Note  Patient: [name].  Procedure(s) Performed: COLONOSCOPY WITH PROPOFOL ESOPHAGOGASTRODUODENOSCOPY (EGD) WITH PROPOFOL BALLOON DILATION POLYPECTOMY  Patient Location: Endoscopy Unit  Anesthesia Type:General  Level of Consciousness: awake, alert , oriented, and patient cooperative  Airway & Oxygen Therapy: Patient Spontanous Breathing and Patient connected to nasal cannula oxygen  Post-op Assessment: Report given to RN, Post -op Vital signs reviewed and stable, and Patient moving all extremities  Post vital signs: Reviewed and stable  Last Vitals:  Vitals Value Taken Time  BP 125/46 04/12/23 0848  Temp 36.6 C 04/12/23 0848  Pulse 60 04/12/23 0848  Resp 16 04/12/23 0848  SpO2 97 % 04/12/23 0848    Last Pain:  Vitals:   04/12/23 0848  TempSrc: Oral  PainSc:          Complications: No notable events documented.

## 2023-04-12 NOTE — Discharge Instructions (Addendum)
EGD Discharge instructions Please read the instructions outlined below and refer to this sheet in the next few weeks. These discharge instructions provide you with general information on caring for yourself after you leave the hospital. Your doctor may also give you specific instructions. While your treatment has been planned according to the most current medical practices available, unavoidable complications occasionally occur. If you have any problems or questions after discharge, please call your doctor. ACTIVITY You may resume your regular activity but move at a slower pace for the next 24 hours.  Take frequent rest periods for the next 24 hours.  Walking will help expel (get rid of) the air and reduce the bloated feeling in your abdomen.  No driving for 24 hours (because of the anesthesia (medicine) used during the test).  You may shower.  Do not sign any important legal documents or operate any machinery for 24 hours (because of the anesthesia used during the test).  NUTRITION Drink plenty of fluids.  You may resume your normal diet.  Begin with a light meal and progress to your normal diet.  Avoid alcoholic beverages for 24 hours or as instructed by your caregiver.  MEDICATIONS You may resume your normal medications unless your caregiver tells you otherwise.  WHAT YOU CAN EXPECT TODAY You may experience abdominal discomfort such as a feeling of fullness or "gas" pains.  FOLLOW-UP Your doctor will discuss the results of your test with you.  SEEK IMMEDIATE MEDICAL ATTENTION IF ANY OF THE FOLLOWING OCCUR: Excessive nausea (feeling sick to your stomach) and/or vomiting.  Severe abdominal pain and distention (swelling).  Trouble swallowing.  Temperature over 101 F (37.8 C).  Rectal bleeding or vomiting of blood.      Colonoscopy Discharge Instructions  Read the instructions outlined below and refer to this sheet in the next few weeks. These discharge instructions provide you  with general information on caring for yourself after you leave the hospital. Your doctor may also give you specific instructions. While your treatment has been planned according to the most current medical practices available, unavoidable complications occasionally occur.   ACTIVITY You may resume your regular activity, but move at a slower pace for the next 24 hours.  Take frequent rest periods for the next 24 hours.  Walking will help get rid of the air and reduce the bloated feeling in your belly (abdomen).  No driving for 24 hours (because of the medicine (anesthesia) used during the test).   Do not sign any important legal documents or operate any machinery for 24 hours (because of the anesthesia used during the test).  NUTRITION Drink plenty of fluids.  You may resume your normal diet as instructed by your doctor.  Begin with a light meal and progress to your normal diet. Heavy or fried foods are harder to digest and may make you feel sick to your stomach (nauseated).  Avoid alcoholic beverages for 24 hours or as instructed.  MEDICATIONS You may resume your normal medications unless your doctor tells you otherwise.  WHAT YOU CAN EXPECT TODAY Some feelings of bloating in the abdomen.  Passage of more gas than usual.  Spotting of blood in your stool or on the toilet paper.  IF YOU HAD POLYPS REMOVED DURING THE COLONOSCOPY: No aspirin products for 7 days or as instructed.  No alcohol for 7 days or as instructed.  Eat a soft diet for the next 24 hours.  FINDING OUT THE RESULTS OF YOUR TEST Not all test results  are available during your visit. If your test results are not back during the visit, make an appointment with your caregiver to find out the results. Do not assume everything is normal if you have not heard from your caregiver or the medical facility. It is important for you to follow up on all of your test results.  SEEK IMMEDIATE MEDICAL ATTENTION IF: You have more than a  spotting of blood in your stool.  Your belly is swollen (abdominal distention).  You are nauseated or vomiting.  You have a temperature over 101.  You have abdominal pain or discomfort that is severe or gets worse throughout the day.   Your upper endoscopy revealed a tightening of your esophagus.  I stretched this out today.  Hopefully this helps with feeling of food getting stuck.  We may need to consider esophageal manometry to further evaluate.  Stomach and small bowel appeared normal.  Your colonoscopy revealed 2 polyp(s) which I removed successfully. Await pathology results, my office will contact you.  I do not think we need to perform further colonoscopy for surveillance purposes.  I did not see any active inflammation indicative of underlying inflammatory bowel disease such as ulcerative colitis or Crohn's disease.  Continue on Imodium.  You also have diverticulosis and internal hemorrhoids. I would recommend increasing fiber in your diet or adding OTC Benefiber/Metamucil. Be sure to drink at least 4 to 6 glasses of water daily. Follow-up with GI in 2-3 months   I hope you have a great rest of your week!  Hennie Duos. Marletta Lor, D.O. Gastroenterology and Hepatology [facility]

## 2023-04-12 NOTE — Op Note (Signed)
[facility] Patient Name: [name] Procedure Date: 04/12/2023 8:07 AM MRN: [id number] Date of Birth: [birth year] Attending MD: Hennie Duos. Maple Mirza, [id number] CSN: [id number] Age: 72 Admit Type: Outpatient Procedure:                Colonoscopy Indications:              Iron deficiency anemia Providers:                Hennie Duos. Marletta Lor, DO, Nena Polio, RN, Pandora Leiter, Technician Referring MD:              Medicines:                See the Anesthesia note for documentation of the                            administered medications Complications:            No immediate complications. Estimated Blood Loss:     Estimated blood loss was minimal. Procedure:                Pre-Anesthesia Assessment:                           - The anesthesia plan was to use monitored                            anesthesia care ([name]).                           After obtaining informed consent, the colonoscope                            was passed under direct vision. Throughout the                            procedure, the patient's blood pressure, pulse, and                            oxygen saturations were monitored continuously. The                            [id number]-HQ190L ([id number]) scope was introduced through                            the anus and advanced to the the cecum, identified                            by appendiceal orifice and ileocecal valve. The                            colonoscopy was performed without difficulty. The                            patient tolerated the procedure well. The quality  of the bowel preparation was evaluated using the                            BBPS [facility] Bowel Preparation Scale) with scores                            of: Right Colon = 3, Transverse Colon = 3 and Left                            Colon = 3 (entire mucosa seen well with no residual                            staining, small fragments  of stool or opaque                            liquid). The total BBPS score equals 9. Scope In: 8:26:29 AM Scope Out: 8:39:50 AM Scope Withdrawal Time: 0 hours 11 minutes 33 seconds  Total Procedure Duration: 0 hours 13 minutes 21 seconds  Findings:      Non-bleeding internal hemorrhoids were found during endoscopy.      Multiple medium-mouthed and small-mouthed diverticula were found in the       sigmoid colon and descending colon.      Two sessile polyps were found in the transverse colon. The polyps were 4       to 7 mm in size. These polyps were removed with a cold snare. Resection       and retrieval were complete.      The exam was otherwise without abnormality. Impression:               - Non-bleeding internal hemorrhoids.                           - Diverticulosis in the sigmoid colon and in the                            descending colon.                           - Two 4 to 7 mm polyps in the transverse colon,                            removed with a cold snare. Resected and retrieved.                           - The examination was otherwise normal. Moderate Sedation:      Per Anesthesia Care Recommendation:           - Patient has a contact number available for                            emergencies. The signs and symptoms of potential                            delayed complications were discussed with the  patient. Return to normal activities tomorrow.                            Written discharge instructions were provided to the                            patient.                           - Resume previous diet.                           - Continue present medications.                           - Await pathology results.                           - No repeat colonoscopy due to age.                           - Return to GI clinic in 3 months. Procedure Code(s):        --- Professional ---                           [phone], Colonoscopy,  flexible; with removal of                            tumor(s), polyp(s), or other lesion(s) by snare                            technique Diagnosis Code(s):        --- Professional ---                           K64.8, Other hemorrhoids                           D12.3, Benign neoplasm of transverse colon (hepatic                            flexure or splenic flexure)                           D50.9, Iron deficiency anemia, unspecified                           K57.30, Diverticulosis of large intestine without                            perforation or abscess without bleeding CPT copyright 2022 American Medical Association. All rights reserved. The codes documented in this report are preliminary and upon coder review may  be revised to meet current compliance requirements. Hennie Duos. Marletta Lor, DO Hennie Duos. Marletta Lor, DO 04/12/2023 8:42:46 AM This report has been signed electronically. Number of Addenda: 0

## 2023-04-12 NOTE — Op Note (Signed)
[facility] Patient Name: [name] Procedure Date: 04/12/2023 8:08 AM MRN: [id number] Date of Birth: 1951-01-11 Attending MD: Hennie Duos. Marletta Lor , [place], [id number] CSN: [id number] Age: 72 Admit Type: Outpatient Procedure:                Upper GI endoscopy Indications:              Dysphagia, Heartburn Providers:                Hennie Duos. Marletta Lor, DO, Nena Polio, RN, Pandora Leiter, Technician Referring MD:              Medicines:                See the Anesthesia note for documentation of the                            administered medications Complications:            No immediate complications. Estimated Blood Loss:     Estimated blood loss: none. Procedure:                Pre-Anesthesia Assessment:                           - The anesthesia plan was to use monitored                            anesthesia care ([name]).                           After obtaining informed consent, the endoscope was                            passed under direct vision. Throughout the                            procedure, the patient's blood pressure, pulse, and                            oxygen saturations were monitored continuously. The                            [id number]-H190 ([id number]) scope was introduced through the                            mouth, and advanced to the second part of duodenum.                            The upper GI endoscopy was accomplished without                            difficulty. The patient tolerated the procedure                            well. Scope In: 8:15:50 AM Scope  Out: 8:22:17 AM Total Procedure Duration: 0 hours 6 minutes 27 seconds  Findings:      The Z-line was regular and was found 39 cm from the incisors.      No endoscopic abnormality was evident in the esophagus to explain the       patient's complaint of dysphagia. Preparations were made for empiric       dilation. A TTS dilator was passed through the scope. Dilation with an        18-19-20 mm balloon dilator was performed to 20 mm. Dilation was       performed with a mild resistance at 20 mm. Estimated blood loss was none.      The entire examined stomach was normal.      The duodenal bulb, first portion of the duodenum and second portion of       the duodenum were normal. Impression:               - Z-line regular, 39 cm from the incisors.                           - Normal stomach.                           - Normal duodenal bulb, first portion of the                            duodenum and second portion of the duodenum.                           - No specimens collected. Moderate Sedation:      Per Anesthesia Care Recommendation:           - Patient has a contact number available for                            emergencies. The signs and symptoms of potential                            delayed complications were discussed with the                            patient. Return to normal activities tomorrow.                            Written discharge instructions were provided to the                            patient.                           - Resume previous diet.                           - Continue present medications.                           - Await pathology results.                           -  Repeat upper endoscopy PRN for retreatment.                           - Return to GI clinic in 3 months.                           - Consider esophageal manometry Procedure Code(s):        --- Professional ---                           [phone], Esophagogastroduodenoscopy, flexible,                            transoral; diagnostic, including collection of                            specimen(s) by brushing or washing, when performed                            (separate procedure) Diagnosis Code(s):        --- Professional ---                           R13.10, Dysphagia, unspecified                           R12, Heartburn CPT copyright 2022 American Medical  Association. All rights reserved. The codes documented in this report are preliminary and upon coder review may  be revised to meet current compliance requirements. Hennie Duos. Marletta Lor, DO Hennie Duos. Marletta Lor, DO 04/12/2023 8:23:47 AM This report has been signed electronically. Number of Addenda: 0

## 2023-04-13 ENCOUNTER — Other Ambulatory Visit: Payer: Self-pay | Admitting: Cardiology

## 2023-04-13 DIAGNOSIS — I10 Essential (primary) hypertension: Secondary | ICD-10-CM

## 2023-04-13 NOTE — Anesthesia Postprocedure Evaluation (Signed)
Anesthesia Post Note  Patient: [name].  Procedure(s) Performed: COLONOSCOPY WITH PROPOFOL ESOPHAGOGASTRODUODENOSCOPY (EGD) WITH PROPOFOL BALLOON DILATION POLYPECTOMY  Patient location during evaluation: Phase II Anesthesia Type: General Level of consciousness: awake Pain management: pain level controlled Vital Signs Assessment: post-procedure vital signs reviewed and stable Respiratory status: spontaneous breathing and respiratory function stable Cardiovascular status: blood pressure returned to baseline and stable Postop Assessment: no headache and no apparent nausea or vomiting Anesthetic complications: no Comments: Late entry   No notable events documented.   Last Vitals:  Vitals:   04/12/23 0731 04/12/23 0848  BP: (!) 182/86 (!) 125/46  Pulse:  60  Resp:  16  Temp:  36.6 C  SpO2:  97%    Last Pain:  Vitals:   04/12/23 0848  TempSrc: Oral  PainSc:                  [name]

## 2023-04-15 LAB — SURGICAL PATHOLOGY

## 2023-04-18 ENCOUNTER — Encounter (HOSPITAL_COMMUNITY): Payer: Self-pay | Admitting: Internal Medicine

## 2023-04-22 DIAGNOSIS — D631 Anemia in chronic kidney disease: Secondary | ICD-10-CM | POA: Diagnosis not present

## 2023-04-22 DIAGNOSIS — N189 Chronic kidney disease, unspecified: Secondary | ICD-10-CM | POA: Diagnosis not present

## 2023-04-22 DIAGNOSIS — N185 Chronic kidney disease, stage 5: Secondary | ICD-10-CM | POA: Diagnosis not present

## 2023-04-22 DIAGNOSIS — N2581 Secondary hyperparathyroidism of renal origin: Secondary | ICD-10-CM | POA: Diagnosis not present

## 2023-04-22 DIAGNOSIS — E872 Acidosis, unspecified: Secondary | ICD-10-CM | POA: Diagnosis not present

## 2023-04-22 DIAGNOSIS — H2512 Age-related nuclear cataract, left eye: Secondary | ICD-10-CM | POA: Diagnosis not present

## 2023-04-22 DIAGNOSIS — E1122 Type 2 diabetes mellitus with diabetic chronic kidney disease: Secondary | ICD-10-CM | POA: Diagnosis not present

## 2023-04-22 DIAGNOSIS — Z961 Presence of intraocular lens: Secondary | ICD-10-CM | POA: Diagnosis not present

## 2023-04-22 DIAGNOSIS — R300 Dysuria: Secondary | ICD-10-CM | POA: Diagnosis not present

## 2023-04-22 DIAGNOSIS — I120 Hypertensive chronic kidney disease with stage 5 chronic kidney disease or end stage renal disease: Secondary | ICD-10-CM | POA: Diagnosis not present

## 2023-04-22 DIAGNOSIS — K30 Functional dyspepsia: Secondary | ICD-10-CM | POA: Diagnosis not present

## 2023-04-24 DIAGNOSIS — M179 Osteoarthritis of knee, unspecified: Secondary | ICD-10-CM | POA: Diagnosis not present

## 2023-04-24 DIAGNOSIS — M1990 Unspecified osteoarthritis, unspecified site: Secondary | ICD-10-CM | POA: Diagnosis not present

## 2023-04-24 DIAGNOSIS — I10 Essential (primary) hypertension: Secondary | ICD-10-CM | POA: Diagnosis not present

## 2023-04-24 DIAGNOSIS — Z79899 Other long term (current) drug therapy: Secondary | ICD-10-CM | POA: Diagnosis not present

## 2023-04-24 DIAGNOSIS — E790 Hyperuricemia without signs of inflammatory arthritis and tophaceous disease: Secondary | ICD-10-CM | POA: Diagnosis not present

## 2023-04-24 DIAGNOSIS — E119 Type 2 diabetes mellitus without complications: Secondary | ICD-10-CM | POA: Diagnosis not present

## 2023-04-24 DIAGNOSIS — M25561 Pain in right knee: Secondary | ICD-10-CM | POA: Diagnosis not present

## 2023-04-24 DIAGNOSIS — M0589 Other rheumatoid arthritis with rheumatoid factor of multiple sites: Secondary | ICD-10-CM | POA: Diagnosis not present

## 2023-04-24 DIAGNOSIS — M109 Gout, unspecified: Secondary | ICD-10-CM | POA: Diagnosis not present

## 2023-04-24 DIAGNOSIS — M79643 Pain in unspecified hand: Secondary | ICD-10-CM | POA: Diagnosis not present

## 2023-04-25 DIAGNOSIS — H2512 Age-related nuclear cataract, left eye: Secondary | ICD-10-CM | POA: Diagnosis not present

## 2023-05-07 DIAGNOSIS — C040 Malignant neoplasm of anterior floor of mouth: Secondary | ICD-10-CM | POA: Diagnosis not present

## 2023-05-08 DIAGNOSIS — C040 Malignant neoplasm of anterior floor of mouth: Secondary | ICD-10-CM | POA: Diagnosis not present

## 2023-05-23 DIAGNOSIS — D443 Neoplasm of uncertain behavior of pituitary gland: Secondary | ICD-10-CM | POA: Diagnosis not present

## 2023-05-23 DIAGNOSIS — E1122 Type 2 diabetes mellitus with diabetic chronic kidney disease: Secondary | ICD-10-CM | POA: Diagnosis not present

## 2023-05-23 DIAGNOSIS — I10 Essential (primary) hypertension: Secondary | ICD-10-CM | POA: Diagnosis not present

## 2023-05-23 DIAGNOSIS — E890 Postprocedural hypothyroidism: Secondary | ICD-10-CM | POA: Diagnosis not present

## 2023-06-07 DIAGNOSIS — D0000 Carcinoma in situ of oral cavity, unspecified site: Secondary | ICD-10-CM | POA: Diagnosis not present

## 2023-06-20 DIAGNOSIS — C049 Malignant neoplasm of floor of mouth, unspecified: Secondary | ICD-10-CM | POA: Diagnosis not present

## 2023-07-12 ENCOUNTER — Ambulatory Visit: Payer: PRIVATE HEALTH INSURANCE | Admitting: Cardiology

## 2023-07-12 DIAGNOSIS — E669 Obesity, unspecified: Secondary | ICD-10-CM | POA: Diagnosis not present

## 2023-07-12 DIAGNOSIS — N390 Urinary tract infection, site not specified: Secondary | ICD-10-CM | POA: Diagnosis not present

## 2023-07-12 DIAGNOSIS — Z6837 Body mass index (BMI) 37.0-37.9, adult: Secondary | ICD-10-CM | POA: Diagnosis not present

## 2023-07-22 DIAGNOSIS — D631 Anemia in chronic kidney disease: Secondary | ICD-10-CM | POA: Diagnosis not present

## 2023-07-22 DIAGNOSIS — I120 Hypertensive chronic kidney disease with stage 5 chronic kidney disease or end stage renal disease: Secondary | ICD-10-CM | POA: Diagnosis not present

## 2023-07-22 DIAGNOSIS — N189 Chronic kidney disease, unspecified: Secondary | ICD-10-CM | POA: Diagnosis not present

## 2023-07-22 DIAGNOSIS — E1122 Type 2 diabetes mellitus with diabetic chronic kidney disease: Secondary | ICD-10-CM | POA: Diagnosis not present

## 2023-07-22 DIAGNOSIS — N2581 Secondary hyperparathyroidism of renal origin: Secondary | ICD-10-CM | POA: Diagnosis not present

## 2023-07-22 DIAGNOSIS — N185 Chronic kidney disease, stage 5: Secondary | ICD-10-CM | POA: Diagnosis not present

## 2023-07-30 DIAGNOSIS — M0589 Other rheumatoid arthritis with rheumatoid factor of multiple sites: Secondary | ICD-10-CM | POA: Diagnosis not present

## 2023-07-30 DIAGNOSIS — I10 Essential (primary) hypertension: Secondary | ICD-10-CM | POA: Diagnosis not present

## 2023-07-30 DIAGNOSIS — Z79899 Other long term (current) drug therapy: Secondary | ICD-10-CM | POA: Diagnosis not present

## 2023-07-30 DIAGNOSIS — M25561 Pain in right knee: Secondary | ICD-10-CM | POA: Diagnosis not present

## 2023-07-30 DIAGNOSIS — M179 Osteoarthritis of knee, unspecified: Secondary | ICD-10-CM | POA: Diagnosis not present

## 2023-07-30 DIAGNOSIS — M79643 Pain in unspecified hand: Secondary | ICD-10-CM | POA: Diagnosis not present

## 2023-07-30 DIAGNOSIS — E790 Hyperuricemia without signs of inflammatory arthritis and tophaceous disease: Secondary | ICD-10-CM | POA: Diagnosis not present

## 2023-07-30 DIAGNOSIS — M109 Gout, unspecified: Secondary | ICD-10-CM | POA: Diagnosis not present

## 2023-07-30 DIAGNOSIS — M1990 Unspecified osteoarthritis, unspecified site: Secondary | ICD-10-CM | POA: Diagnosis not present

## 2023-08-01 DIAGNOSIS — R1312 Dysphagia, oropharyngeal phase: Secondary | ICD-10-CM | POA: Diagnosis not present

## 2023-08-01 DIAGNOSIS — K222 Esophageal obstruction: Secondary | ICD-10-CM | POA: Diagnosis not present

## 2023-08-01 DIAGNOSIS — R1314 Dysphagia, pharyngoesophageal phase: Secondary | ICD-10-CM | POA: Diagnosis not present

## 2023-08-15 DIAGNOSIS — R1312 Dysphagia, oropharyngeal phase: Secondary | ICD-10-CM | POA: Diagnosis not present

## 2023-08-15 DIAGNOSIS — R633 Feeding difficulties, unspecified: Secondary | ICD-10-CM | POA: Diagnosis not present

## 2023-08-15 DIAGNOSIS — R1319 Other dysphagia: Secondary | ICD-10-CM | POA: Diagnosis not present

## 2023-08-18 NOTE — Progress Notes (Unsigned)
GI Office Note    Referring Provider: Irena Reichmann, DO Primary Care Physician:  Irena Reichmann, DO  Primary Gastroenterologist: Hennie Duos. Marletta Lor, DO   Chief Complaint   No chief complaint on file.   History of Present Illness   [name]. is a 72 y.o. male presenting today for follow up. Last seen 03/2023. History of bloating, GERD, dysphagia, chronically loose stools, anemia of chronic disease in setting of CKD.    Completed EGD/colonoscopy after last ov, see below. Also with labs in 03/2023, negative celiac screen, normal CRP, TSH. Creatinine 5.53, LFTs unremarkable. Labs from 04/2023, Hgb 8.9, Creatinine 6.07, K 5, Alb 3.7, sed rate 22, LFTs normal,tibc 240, iron 63, iron sat 26, ferritin 238, Hgb 9.4  Since we last saw patient he was diagnosed with SCC in situ floor of mouth. Ongoing issues with swallowing, all consistencies. Feels like food gets stuck in upper esophagus, throwing up immediately after swallowing. Losing 80 pounds in 2 years. Patient completed MBSS 08/16/23 at [facility].    Speech therapy evaluation 08/15/23: Impression:  Patient's oropharyngeal swallow is within functional limits. No penetration/aspiration, significant pharyngeal residue, or significant biomechanical deficits. UES opening is adequate.  AP sweep revealed significantly retained bolus material and contractions of the esophagus. 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 Nutrition is of concern. Patient's daughter reports they will follow-up with their managing GI team. May consider a referral to a specialized motility clinic.   Recommendations: Patient to follow-up with GI. Consider referral to motility clinic. Esophagram if further assessment indicated.   Fluoro images FINDINGS: Scout: Surgical staples project over the anterior neck. Multiple dental fillings. Thin  liquids: No penetration or aspiration. Purees: No penetration or aspiration. Soft solid: No penetration or aspiration. Pill/Capsule: Stagnation at the GE junction, advanced by uptake of thin liquid. 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     EGD 04/2023: -Z-line regular. 39cm from incisors -Normal stomach -Normal duodenal bulb, first portion of duodenum and second port of duodenum.  -empiric dilation performed to 20mm with balloon dilator.  Colonoscopy 04/2023: -non-bleeding internal hemorrhoids -diverticulosis in sigmoid and descending colon -two 4-46mm polyps in transverse colon -tubular and hyperplastic polyps -no future colonoscopy for surveillance due to age   Medications   Current Outpatient Medications  Medication Sig Dispense Refill   acetaminophen (TYLENOL) 650 MG CR tablet Take 1,300 mg by mouth every 8 (eight) hours as needed for pain.     albuterol (PROVENTIL HFA;VENTOLIN HFA) 108 (90 Base) MCG/ACT inhaler Inhale 1-2 puffs into the lungs every 6 (six) hours as needed for wheezing or shortness of breath.     ALPRAZolam (XANAX) 1 MG tablet Take 1-2 mg by mouth every 4 (four) hours as needed.     cyclobenzaprine (FLEXERIL) 10 MG tablet Take 10 mg by mouth at bedtime.     doxazosin (CARDURA) 2 MG tablet Take 2 mg by mouth 2 (two) times daily.      EPINEPHrine 0.3 mg/0.3 mL IJ SOAJ injection Inject 0.3 mLs (0.3 mg total) into the muscle as needed for up to 2 doses. (Patient taking differently: Inject 0.3 mg into the muscle as needed for anaphylaxis.) 2 Device 0   escitalopram (LEXAPRO) 10 MG tablet Take 10 mg by mouth daily.     ferrous sulfate 325 (65  FE) MG tablet Take 325 mg by mouth daily.     finasteride (PROSCAR) 5 MG tablet Take 5 mg by mouth daily.     furosemide (LASIX) 80 MG tablet Take 80 mg by mouth 2 (two) times daily.     isosorbide dinitrate (ISORDIL)  30 MG tablet TAKE 2 TABLETS BY MOUTH 3 TIMES A DAY 540 tablet 0   labetalol (NORMODYNE) 200 MG tablet TAKE 1 TABLET BY MOUTH TWICE A DAY 180 tablet 1   leflunomide (ARAVA) 20 MG tablet Take 20 mg by mouth daily.     levothyroxine (SYNTHROID) 175 MCG tablet Take 175 mcg by mouth daily before breakfast.     pantoprazole (PROTONIX) 20 MG tablet Take 20 mg by mouth daily.     predniSONE (DELTASONE) 5 MG tablet Take 5 mg by mouth daily as needed (arthritis).      Pseudoephedrine-guaiFENesin (MUCINEX D PO) Take by mouth. Takes as needed     QUEtiapine (SEROQUEL) 25 MG tablet Take 25 mg by mouth at bedtime. Take one tablet at bedtime with 50mg  dose for a total of 75mg  daily     spironolactone (ALDACTONE) 25 MG tablet Take 25 mg by mouth daily.     No current facility-administered medications for this visit.    Allergies   Allergies as of 08/19/2023 - Review Complete 04/12/2023  Allergen Reaction Noted   Atorvastatin Other (See Comments) 08/05/2018   Farxiga [dapagliflozin] Other (See Comments) 08/05/2018   Folic acid Hives 08/05/2018   Humira [adalimumab] Other (See Comments) 08/05/2018   Metformin and related Other (See Comments) 08/05/2018   Plavix [clopidogrel bisulfate] Hives 08/05/2018   Simvastatin Other (See Comments) 08/05/2018     Past Medical History   Past Medical History:  Diagnosis Date   Asthma    Chronic kidney disease    stage 5   Diabetes mellitus without complication (HCC)    Hyperlipidemia    Hypertension    Hypothyroidism    Obesity    Pituitary macroadenoma (HCC)    Rheumatoid arthritis (HCC)    Sleep apnea     Past Surgical History   Past Surgical History:  Procedure Laterality Date   Arthroscopic knee surgery Right 1998   BALLOON DILATION N/A 04/12/2023   Procedure: BALLOON DILATION;  Surgeon: Lanelle Bal, DO;  Location: [facility];  Service: Endoscopy;  Laterality: N/A;  8:00AM;ASA 3   CATARACT EXTRACTION Right 04/04/2023   COLONOSCOPY WITH  PROPOFOL N/A 04/12/2023   Procedure: COLONOSCOPY WITH PROPOFOL;  Surgeon: Lanelle Bal, DO;  Location: [facility];  Service: Endoscopy;  Laterality: N/A;  8:00AM;ASA 3   ESOPHAGOGASTRODUODENOSCOPY (EGD) WITH PROPOFOL N/A 04/12/2023   Procedure: ESOPHAGOGASTRODUODENOSCOPY (EGD) WITH PROPOFOL;  Surgeon: Lanelle Bal, DO;  Location: [facility];  Service: Endoscopy;  Laterality: N/A;  8:00AM;ASA 3   Gamma knife surgery  2011   HAND SURGERY     POLYPECTOMY  04/12/2023   Procedure: POLYPECTOMY;  Surgeon: Lanelle Bal, DO;  Location: [facility];  Service: Endoscopy;;   THYROIDECTOMY     TONSILLECTOMY     TUMOR REMOVAL     Pituitary    Past Family History   Family History  Problem Relation Age of Onset   Hypertension Mother    Emphysema Father    Healthy Sister    Healthy Brother    Healthy Sister     Past Social History   Social History   Socioeconomic History   Marital status: Married  Spouse name: Not on file   Number of children: 2   Years of education: Not on file   Highest education level: Not on file  Occupational History   Not on file  Tobacco Use   Smoking status: Every Day    Current packs/day: 1.00    Average packs/day: 1 pack/day for 40.0 years (40.0 ttl pk-yrs)    Types: Cigarettes    Passive exposure: Current   Smokeless tobacco: Never  Vaping Use   Vaping status: Never Used  Substance and Sexual Activity   Alcohol use: Yes    Comment: occasionally   Drug use: Not Currently    Types: Marijuana   Sexual activity: Not on file  Other Topics Concern   Not on file  Social History Narrative   Not on file   Social Determinants of Health   Financial Resource Strain: Not on file  Food Insecurity: Not on file  Transportation Needs: Not on file  Physical Activity: Not on file  Stress: Not on file  Social Connections: Not on file  Intimate Partner Violence: Not on file    Review of Systems   General: Negative for anorexia, weight  loss, fever, chills, fatigue, weakness. ENT: Negative for hoarseness, difficulty swallowing , nasal congestion. CV: Negative for chest pain, angina, palpitations, dyspnea on exertion, peripheral edema.  Respiratory: Negative for dyspnea at rest, dyspnea on exertion, cough, sputum, wheezing.  GI: See history of present illness. GU:  Negative for dysuria, hematuria, urinary incontinence, urinary frequency, nocturnal urination.  Endo: Negative for unusual weight change.     Physical Exam   There were no vitals taken for this visit.   General: Well-nourished, well-developed in no acute distress.  Eyes: No icterus. Mouth: Oropharyngeal mucosa moist and pink , no lesions erythema or exudate. Lungs: Clear to auscultation bilaterally.  Heart: Regular rate and rhythm, no murmurs rubs or gallops.  Abdomen: Bowel sounds are normal, nontender, nondistended, no hepatosplenomegaly or masses,  no abdominal bruits or hernia , no rebound or guarding.  Rectal: ***  Extremities: No lower extremity edema. No clubbing or deformities. Neuro: Alert and oriented x 4   Skin: Warm and dry, no jaundice.   Psych: Alert and cooperative, normal mood and affect.  Labs   *** Imaging Studies   No results found.  Assessment       PLAN   ***   [name]. Melvyn Neth, MHS, PA-C [facility]

## 2023-08-19 ENCOUNTER — Ambulatory Visit (INDEPENDENT_AMBULATORY_CARE_PROVIDER_SITE_OTHER): Payer: Medicare Other | Admitting: Gastroenterology

## 2023-08-19 ENCOUNTER — Encounter: Payer: Self-pay | Admitting: Gastroenterology

## 2023-08-19 VITALS — BP 139/60 | HR 62 | Temp 97.6°F | Ht 68.0 in | Wt 231.0 lb

## 2023-08-19 DIAGNOSIS — R112 Nausea with vomiting, unspecified: Secondary | ICD-10-CM | POA: Insufficient documentation

## 2023-08-19 DIAGNOSIS — K529 Noninfective gastroenteritis and colitis, unspecified: Secondary | ICD-10-CM | POA: Insufficient documentation

## 2023-08-19 DIAGNOSIS — R634 Abnormal weight loss: Secondary | ICD-10-CM | POA: Diagnosis not present

## 2023-08-19 DIAGNOSIS — Z23 Encounter for immunization: Secondary | ICD-10-CM | POA: Diagnosis not present

## 2023-08-19 DIAGNOSIS — R1310 Dysphagia, unspecified: Secondary | ICD-10-CM | POA: Diagnosis not present

## 2023-08-19 NOTE — Patient Instructions (Signed)
For diarrhea: try taking imodium 1-2mg  in the morning. You may take twice daily. Let me know if this does not help. If you notice worsening diarrhea, let me know and we will check stool studies. Continue pantoprazole 20mg  daily. I will discuss recent barium study with Dr. Marletta Lor. You may need esophageal manometry testing or he may want to try medication first. Further recommendations to follow.

## 2023-08-22 ENCOUNTER — Telehealth: Payer: Self-pay

## 2023-08-22 NOTE — Telephone Encounter (Signed)
Yes, Dr. Marletta Lor advising starting with dissolving 2 Altoid mints under the tongue before meals. Peppermint can help relax the esophagus. If this does not help, we have medication options we would try next. Dr. Marletta Lor would recommend long acting calcium channel blockers.   He does advise we move forward with work up for esophageal motility disorder since it can take a long time to get him seen. He recommends [facility].   Have him call with progress report in two weeks.

## 2023-08-22 NOTE — Telephone Encounter (Signed)
Wife called wanting to know if you was able to speak to Dr. Marletta Lor. Please advise.

## 2023-08-23 NOTE — Telephone Encounter (Signed)
Lmom for return call.

## 2023-08-29 ENCOUNTER — Telehealth: Payer: Self-pay | Admitting: Internal Medicine

## 2023-08-29 NOTE — Telephone Encounter (Signed)
Pt's wife was made aware and verbalized understanding. Will call back with a report in 2 weeks as requested.

## 2023-08-29 NOTE — Telephone Encounter (Signed)
Pt's daughter came to front desk asking for someone to call her mother [name]) about her dad (patient). He was seen on 9/16 by [facility] and was told [facility] was going to talk with Dr Marletta Lor and they were wanting to follow up one that. Please call [phone]

## 2023-08-29 NOTE — Telephone Encounter (Signed)
See other telephone note.  

## 2023-08-30 ENCOUNTER — Emergency Department (HOSPITAL_BASED_OUTPATIENT_CLINIC_OR_DEPARTMENT_OTHER): Payer: Medicare Other | Admitting: Radiology

## 2023-08-30 ENCOUNTER — Emergency Department (HOSPITAL_BASED_OUTPATIENT_CLINIC_OR_DEPARTMENT_OTHER)
Admission: EM | Admit: 2023-08-30 | Discharge: 2023-08-30 | Disposition: A | Discharge status: Home / Self Care | Payer: Medicare Other | Attending: Emergency Medicine | Admitting: Emergency Medicine

## 2023-08-30 ENCOUNTER — Emergency Department (HOSPITAL_BASED_OUTPATIENT_CLINIC_OR_DEPARTMENT_OTHER): Payer: Medicare Other

## 2023-08-30 ENCOUNTER — Encounter (HOSPITAL_BASED_OUTPATIENT_CLINIC_OR_DEPARTMENT_OTHER): Payer: Self-pay | Admitting: Emergency Medicine

## 2023-08-30 ENCOUNTER — Other Ambulatory Visit: Payer: Self-pay

## 2023-08-30 DIAGNOSIS — S50811A Abrasion of right forearm, initial encounter: Secondary | ICD-10-CM | POA: Insufficient documentation

## 2023-08-30 DIAGNOSIS — E1122 Type 2 diabetes mellitus with diabetic chronic kidney disease: Secondary | ICD-10-CM | POA: Diagnosis not present

## 2023-08-30 DIAGNOSIS — D72829 Elevated white blood cell count, unspecified: Secondary | ICD-10-CM | POA: Diagnosis not present

## 2023-08-30 DIAGNOSIS — R519 Headache, unspecified: Secondary | ICD-10-CM | POA: Diagnosis not present

## 2023-08-30 DIAGNOSIS — S199XXA Unspecified injury of neck, initial encounter: Secondary | ICD-10-CM | POA: Diagnosis not present

## 2023-08-30 DIAGNOSIS — N186 End stage renal disease: Secondary | ICD-10-CM | POA: Diagnosis not present

## 2023-08-30 DIAGNOSIS — Z79899 Other long term (current) drug therapy: Secondary | ICD-10-CM | POA: Insufficient documentation

## 2023-08-30 DIAGNOSIS — G9341 Metabolic encephalopathy: Secondary | ICD-10-CM | POA: Insufficient documentation

## 2023-08-30 DIAGNOSIS — G9349 Other encephalopathy: Secondary | ICD-10-CM

## 2023-08-30 DIAGNOSIS — R600 Localized edema: Secondary | ICD-10-CM | POA: Diagnosis not present

## 2023-08-30 DIAGNOSIS — E872 Acidosis, unspecified: Secondary | ICD-10-CM

## 2023-08-30 DIAGNOSIS — R0781 Pleurodynia: Secondary | ICD-10-CM | POA: Insufficient documentation

## 2023-08-30 DIAGNOSIS — Z043 Encounter for examination and observation following other accident: Secondary | ICD-10-CM | POA: Diagnosis not present

## 2023-08-30 DIAGNOSIS — S50812A Abrasion of left forearm, initial encounter: Secondary | ICD-10-CM | POA: Insufficient documentation

## 2023-08-30 DIAGNOSIS — K224 Dyskinesia of esophagus: Secondary | ICD-10-CM | POA: Diagnosis not present

## 2023-08-30 DIAGNOSIS — N184 Chronic kidney disease, stage 4 (severe): Secondary | ICD-10-CM | POA: Insufficient documentation

## 2023-08-30 DIAGNOSIS — W19XXXA Unspecified fall, initial encounter: Secondary | ICD-10-CM | POA: Insufficient documentation

## 2023-08-30 DIAGNOSIS — S0990XA Unspecified injury of head, initial encounter: Secondary | ICD-10-CM | POA: Diagnosis not present

## 2023-08-30 DIAGNOSIS — I129 Hypertensive chronic kidney disease with stage 1 through stage 4 chronic kidney disease, or unspecified chronic kidney disease: Secondary | ICD-10-CM | POA: Insufficient documentation

## 2023-08-30 DIAGNOSIS — I6523 Occlusion and stenosis of bilateral carotid arteries: Secondary | ICD-10-CM | POA: Diagnosis not present

## 2023-08-30 DIAGNOSIS — S59911A Unspecified injury of right forearm, initial encounter: Secondary | ICD-10-CM | POA: Diagnosis present

## 2023-08-30 DIAGNOSIS — R55 Syncope and collapse: Secondary | ICD-10-CM | POA: Diagnosis not present

## 2023-08-30 DIAGNOSIS — R9431 Abnormal electrocardiogram [ECG] [EKG]: Secondary | ICD-10-CM | POA: Diagnosis not present

## 2023-08-30 DIAGNOSIS — E039 Hypothyroidism, unspecified: Secondary | ICD-10-CM | POA: Diagnosis not present

## 2023-08-30 LAB — URINALYSIS, ROUTINE W REFLEX MICROSCOPIC
Bacteria, UA: NONE SEEN
Bilirubin Urine: NEGATIVE
Glucose, UA: NEGATIVE mg/dL
Ketones, ur: NEGATIVE mg/dL
Nitrite: NEGATIVE
Protein, ur: >300 mg/dL — AB
Specific Gravity, Urine: 1.013 (ref 1.005–1.030)
pH: 5.5 (ref 5.0–8.0)

## 2023-08-30 LAB — I-STAT VENOUS BLOOD GAS, ED
Acid-base deficit: 16 mmol/L — ABNORMAL HIGH (ref 0.0–2.0)
Bicarbonate: 11.6 mmol/L — ABNORMAL LOW (ref 20.0–28.0)
Calcium, Ion: 0.9 mmol/L — ABNORMAL LOW (ref 1.15–1.40)
HCT: 23 % — ABNORMAL LOW (ref 39.0–52.0)
Hemoglobin: 7.8 g/dL — ABNORMAL LOW (ref 13.0–17.0)
O2 Saturation: 57 %
Potassium: 5 mmol/L (ref 3.5–5.1)
Sodium: 141 mmol/L (ref 135–145)
TCO2: 13 mmol/L — ABNORMAL LOW (ref 22–32)
pCO2, Ven: 31.4 mm[Hg] — ABNORMAL LOW (ref 44–60)
pH, Ven: 7.175 — CL (ref 7.25–7.43)
pO2, Ven: 37 mm[Hg] (ref 32–45)

## 2023-08-30 LAB — COMPREHENSIVE METABOLIC PANEL
ALT: 12 U/L (ref 0–44)
AST: 15 U/L (ref 15–41)
Albumin: 3.6 g/dL (ref 3.5–5.0)
Alkaline Phosphatase: 55 U/L (ref 38–126)
Anion gap: 15 (ref 5–15)
BUN: 120 mg/dL — ABNORMAL HIGH (ref 8–23)
CO2: 12 mmol/L — ABNORMAL LOW (ref 22–32)
Calcium: 7.1 mg/dL — ABNORMAL LOW (ref 8.9–10.3)
Chloride: 112 mmol/L — ABNORMAL HIGH (ref 98–111)
Creatinine, Ser: 7.26 mg/dL — ABNORMAL HIGH (ref 0.61–1.24)
GFR, Estimated: 8 mL/min — ABNORMAL LOW (ref >60)
Glucose, Bld: 109 mg/dL — ABNORMAL HIGH (ref 70–99)
Potassium: 5 mmol/L (ref 3.5–5.1)
Sodium: 139 mmol/L (ref 135–145)
Total Bilirubin: 0.4 mg/dL (ref 0.3–1.2)
Total Protein: 6.3 g/dL — ABNORMAL LOW (ref 6.5–8.1)

## 2023-08-30 LAB — CBC WITH DIFFERENTIAL/PLATELET
Abs Immature Granulocytes: 0.26 10*3/uL — ABNORMAL HIGH (ref 0.00–0.07)
Basophils Absolute: 0 10*3/uL (ref 0.0–0.1)
Basophils Relative: 0 %
Eosinophils Absolute: 0.1 10*3/uL (ref 0.0–0.5)
Eosinophils Relative: 1 %
HCT: 28.2 % — ABNORMAL LOW (ref 39.0–52.0)
Hemoglobin: 8.7 g/dL — ABNORMAL LOW (ref 13.0–17.0)
Immature Granulocytes: 2 %
Lymphocytes Relative: 4 %
Lymphs Abs: 0.5 10*3/uL — ABNORMAL LOW (ref 0.7–4.0)
MCH: 29.6 pg (ref 26.0–34.0)
MCHC: 30.9 g/dL (ref 30.0–36.0)
MCV: 95.9 fL (ref 80.0–100.0)
Monocytes Absolute: 0.7 10*3/uL (ref 0.1–1.0)
Monocytes Relative: 7 %
Neutro Abs: 9.2 10*3/uL — ABNORMAL HIGH (ref 1.7–7.7)
Neutrophils Relative %: 86 %
Platelets: 287 10*3/uL (ref 150–400)
RBC: 2.94 MIL/uL — ABNORMAL LOW (ref 4.22–5.81)
RDW: 14.9 % (ref 11.5–15.5)
WBC: 10.7 10*3/uL — ABNORMAL HIGH (ref 4.0–10.5)
nRBC: 0 % (ref 0.0–0.2)

## 2023-08-30 LAB — BRAIN NATRIURETIC PEPTIDE: B Natriuretic Peptide: 395.7 pg/mL — ABNORMAL HIGH (ref 0.0–100.0)

## 2023-08-30 LAB — SALICYLATE LEVEL: Salicylate Lvl: <7 mg/dL — ABNORMAL LOW (ref 7.0–30.0)

## 2023-08-30 LAB — ETHANOL: Alcohol, Ethyl (B): <10 mg/dL (ref <10)

## 2023-08-30 LAB — TSH: TSH: 5.77 u[IU]/mL — ABNORMAL HIGH (ref 0.350–4.500)

## 2023-08-30 LAB — TROPONIN I (HIGH SENSITIVITY): Troponin I (High Sensitivity): 78 ng/L — ABNORMAL HIGH (ref <18)

## 2023-08-30 LAB — LIPASE, BLOOD: Lipase: 31 U/L (ref 11–51)

## 2023-08-30 LAB — LACTIC ACID, PLASMA: Lactic Acid, Venous: 0.5 mmol/L (ref 0.5–1.9)

## 2023-08-30 MED ORDER — IPRATROPIUM-ALBUTEROL 0.5-2.5 (3) MG/3ML IN SOLN
3.0000 mL | Freq: Once | RESPIRATORY_TRACT | Status: AC
  Administered 2023-08-30: 3 mL via RESPIRATORY_TRACT
  Filled 2023-08-30: qty 3

## 2023-08-30 MED ORDER — BACITRACIN ZINC 500 UNIT/GM EX OINT
TOPICAL_OINTMENT | Freq: Two times a day (BID) | CUTANEOUS | Status: DC
  Filled 2023-08-30: qty 28.35

## 2023-08-30 NOTE — Discharge Instructions (Signed)
Please call the hospice individual back when you get home after you have found the information she asked you about earlier today.  Today your labs show that your kidney function has decreased and you are building up the area which is affecting your brain causing you to fall.  We offered to admit you and speak with nephrology for possible dialysis however he declined at this time and wanted to see the hospice individual.  Please reach out to your nephrologist and primary care provider to update them of the visit today.  If symptoms change or worsen please return to ER.

## 2023-08-30 NOTE — ED Provider Notes (Signed)
[place] EMERGENCY DEPARTMENT AT [facility] Provider Note   CSN: [id number] Arrival date & time: 08/30/23  1028     History  Chief Complaint  Patient presents with   [name]. is a 72 y.o. male history of rheumatoid arthritis, type 2 diabetes, prolonged QT, stage IV chronic kidney disease, hypertension, hypothyroidism, pituitary macroadenoma presented after a fall that occurred at 2 AM this morning.  Patient dates he woke up in the bathroom on the floor with no idea how he got there.  Patient states has had hurts but denies any neck pain.  Patient is the right side of his ribs hurt but states he does not have any chest pain or shortness of breath.  Patient states his legs are normally swollen.  Patient does note that he has stage IV chronic kidney disease but is not on dialysis as he refuses.  Triage note initially stated that patient was on blood thinners however when I spoke to the patient and his daughter they denied this.  Patient still makes urine.  Wife states that patient has had frequent falls over the past few days.  Patient and wife deny nausea vomiting, history of cirrhosis, vision changes, new onset weakness, fevers, chills, abdominal pain, altered mental status, new medications  Home Medications Prior to Admission medications   Medication Sig Start Date End Date Taking? Authorizing Provider  acetaminophen (TYLENOL) 650 MG CR tablet Take 1,300 mg by mouth every 8 (eight) hours as needed for pain.    [provider]  albuterol (PROVENTIL HFA;VENTOLIN HFA) 108 (90 Base) MCG/ACT inhaler Inhale 1-2 puffs into the lungs every 6 (six) hours as needed for wheezing or shortness of breath.    [provider]  ALPRAZolam Prudy Feeler) 1 MG tablet Take 1-2 mg by mouth every 4 (four) hours as needed. 06/20/22   [provider]  doxazosin (CARDURA) 2 MG tablet Take 2 mg by mouth 2 (two) times daily.     [provider]  EPINEPHrine  0.3 mg/0.3 mL IJ SOAJ injection Inject 0.3 mLs (0.3 mg total) into the muscle as needed for up to 2 doses. Patient taking differently: Inject 0.3 mg into the muscle as needed for anaphylaxis. 08/06/18   Nash Dimmer, MD  escitalopram (LEXAPRO) 10 MG tablet Take 10 mg by mouth daily.    [provider]  ferrous sulfate 325 (65 FE) MG tablet Take 325 mg by mouth daily. 06/20/21   [provider]  finasteride (PROSCAR) 5 MG tablet Take 5 mg by mouth daily.    [provider]  furosemide (LASIX) 80 MG tablet Take 80 mg by mouth 2 (two) times daily. 06/18/20   [provider]  isosorbide dinitrate (ISORDIL) 30 MG tablet TAKE 2 TABLETS BY MOUTH 3 TIMES A DAY 02/25/23   Tolia, Sunit, DO  labetalol (NORMODYNE) 200 MG tablet TAKE 1 TABLET BY MOUTH TWICE A DAY 04/15/23   Tolia, Sunit, DO  leflunomide (ARAVA) 20 MG tablet Take 20 mg by mouth daily. 12/04/20   [provider]  levothyroxine (SYNTHROID) 175 MCG tablet Take 175 mcg by mouth daily before breakfast.    [provider]  pantoprazole (PROTONIX) 20 MG tablet Take 20 mg by mouth daily. 11/09/21   [provider]  predniSONE (DELTASONE) 5 MG tablet Take 5 mg by mouth daily as needed (arthritis).  06/02/20   [provider]  QUEtiapine (SEROQUEL) 25 MG tablet Take 25 mg by mouth at  bedtime. Take one tablet at bedtime with 50mg  dose for a total of 75mg  daily    [provider]  spironolactone (ALDACTONE) 25 MG tablet Take 25 mg by mouth daily. 03/31/21   [provider]      Allergies    Atorvastatin, Farxiga [dapagliflozin], Folic acid, Humira [adalimumab], Metformin and related, Plavix [clopidogrel bisulfate], and Simvastatin    Review of Systems   Review of Systems  Physical Exam Updated Vital Signs BP (!) 158/77   Pulse 64   Temp (!) 97.4 F (36.3 C) (Rectal)   Resp 18   Ht 5\' 8"  (1.727 m)   Wt 104.3 kg   SpO2 97%   BMI 34.97 kg/m  Physical Exam Vitals  reviewed.  Constitutional:      General: He is in acute distress.     Appearance: He is ill-appearing.  HENT:     Head: Normocephalic and atraumatic.  Eyes:     General: Scleral icterus present.     Extraocular Movements: Extraocular movements intact.     Conjunctiva/sclera: Conjunctivae normal.     Pupils: Pupils are equal, round, and reactive to light.  Cardiovascular:     Rate and Rhythm: Normal rate and regular rhythm.     Pulses: Normal pulses.     Heart sounds: Normal heart sounds.     Comments: 2+ bilateral radial/dorsalis pedis pulses with regular rate Pulmonary:     Effort: Pulmonary effort is normal. No respiratory distress.     Breath sounds: Normal breath sounds.  Abdominal:     Palpations: Abdomen is soft.     Tenderness: There is no abdominal tenderness. There is no guarding or rebound.  Musculoskeletal:        General: Normal range of motion.     Cervical back: Normal range of motion and neck supple. No tenderness.     Comments: 5 out of 5 bilateral grip/leg extension strength Tenderness palpation right ribs without bony abnormalities palpated  Skin:    General: Skin is warm and dry.     Capillary Refill: Capillary refill takes less than 2 seconds.     Comments: Multiple abrasions and cuts to bilateral forearms from falls  Neurological:     General: No focal deficit present.     Mental Status: He is alert and oriented to person, place, and time.     Sensory: Sensation is intact.     Motor: Motor function is intact.     Coordination: Coordination is intact.     Gait: Gait is intact.     Comments: Sensation intact in all 4 limbs Vision grossly intact Cranial nerves III through XII intact No asterixis No tremors noted on exam Able to walk without abnormalities  Psychiatric:        Mood and Affect: Mood normal.     ED Results / Procedures / Treatments   Labs (all labs ordered are listed, but only abnormal results are displayed) Labs Reviewed  CBC WITH  DIFFERENTIAL/PLATELET - Abnormal; Notable for the following components:      Result Value   WBC 10.7 (*)    RBC 2.94 (*)    Hemoglobin 8.7 (*)    HCT 28.2 (*)    Neutro Abs 9.2 (*)    Lymphs Abs 0.5 (*)    Abs Immature Granulocytes 0.26 (*)    All other components within normal limits  COMPREHENSIVE METABOLIC PANEL - Abnormal; Notable for the following components:   Chloride 112 (*)  CO2 12 (*)    Glucose, Bld 109 (*)    BUN 120 (*)    Creatinine, Ser 7.26 (*)    Calcium 7.1 (*)    Total Protein 6.3 (*)    GFR, Estimated 8 (*)    All other components within normal limits  URINALYSIS, ROUTINE W REFLEX MICROSCOPIC - Abnormal; Notable for the following components:   Hgb urine dipstick SMALL (*)    Protein, ur >300 (*)    Leukocytes,Ua TRACE (*)    All other components within normal limits  BRAIN NATRIURETIC PEPTIDE - Abnormal; Notable for the following components:   B Natriuretic Peptide 395.7 (*)    All other components within normal limits  TSH - Abnormal; Notable for the following components:   TSH 5.770 (*)    All other components within normal limits  SALICYLATE LEVEL - Abnormal; Notable for the following components:   Salicylate Lvl <7.0 (*)    All other components within normal limits  I-STAT VENOUS BLOOD GAS, ED - Abnormal; Notable for the following components:   pH, Ven 7.175 (*)    pCO2, Ven 31.4 (*)    Bicarbonate 11.6 (*)    TCO2 13 (*)    Acid-base deficit 16.0 (*)    Calcium, Ion 0.90 (*)    HCT 23.0 (*)    Hemoglobin 7.8 (*)    All other components within normal limits  TROPONIN I (HIGH SENSITIVITY) - Abnormal; Notable for the following components:   Troponin I (High Sensitivity) 78 (*)    All other components within normal limits  LIPASE, BLOOD  LACTIC ACID, PLASMA  ETHANOL  BLOOD GAS, VENOUS  LACTIC ACID, PLASMA    EKG EKG Interpretation Date/Time:  Friday August 30 2023 10:50:06 EDT Ventricular Rate:  58 PR Interval:  133 QRS  Duration:  108 QT Interval:  508 QTC Calculation: 499 R Axis:   44  Text Interpretation: Sinus rhythm Abnormal R-wave progression, early transition Borderline prolonged QT interval Confirmed by Virgina Norfolk [phone]) on 08/30/2023 11:36:39 AM  Radiology CT Head Wo Contrast  Result Date: 08/30/2023 CLINICAL DATA:  Trauma, pain EXAM: CT HEAD WITHOUT CONTRAST CT CERVICAL SPINE WITHOUT CONTRAST TECHNIQUE: Multidetector CT imaging of the head and cervical spine was performed following the standard protocol without intravenous contrast. Multiplanar CT image reconstructions of the cervical spine were also generated. RADIATION DOSE REDUCTION: This exam was performed according to the departmental dose-optimization program which includes automated exposure control, adjustment of the mA and/or kV according to patient size and/or use of iterative reconstruction technique. COMPARISON:  None Available. FINDINGS: CT HEAD FINDINGS Brain: There is no acute intracranial hemorrhage, extra-axial fluid collection, or acute infarct Background parenchymal volume is normal for age. There is a remote infarct in the right ACA distribution with associated ex vacuo dilatation of the right lateral ventricle. The ventricles are otherwise normal in size. Incidental note is made of a cavum septum pellucidum. The pituitary and suprasellar region are normal. There is no mass lesion. There is no mass effect or midline shift. Vascular: There is calcification of the bilateral carotid siphons and vertebral arteries. Skull: Normal. Negative for fracture or focal lesion. Sinuses/Orbits: The paranasal sinuses are clear. Bilateral lens implants are in place. The globes and orbits are otherwise unremarkable. Other: The mastoid air cells and middle ear cavities are clear. CT CERVICAL SPINE FINDINGS Alignment: Normal. Skull base and vertebrae: Skull base alignment is maintained. Vertebral body heights are preserved. There is no evidence of acute  fracture. There is no suspicious osseous lesion. Soft tissues and spinal canal: No prevertebral fluid or swelling. No visible canal hematoma. Disc levels: There is overall mild disc space narrowing and degenerative endplate change. There is no evidence of high-grade spinal canal or neural foraminal stenosis. Upper chest: The imaged lung apices are clear. Other: Thyroidectomy clips are noted. IMPRESSION: 1. No acute intracranial pathology. 2. 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 3. No acute fracture or traumatic malalignment of the cervical spine. Electronically Signed   By: Lesia Hausen M.D.   On: 08/30/2023 12:32   CT Cervical Spine Wo Contrast  Result Date: 08/30/2023 CLINICAL DATA:  Trauma, pain EXAM: CT HEAD WITHOUT CONTRAST CT CERVICAL SPINE WITHOUT CONTRAST TECHNIQUE: Multidetector CT imaging of the head and cervical spine was performed following the standard protocol without intravenous contrast. Multiplanar CT image reconstructions of the cervical spine were also generated. RADIATION DOSE REDUCTION: This exam was performed according to the departmental dose-optimization program which includes automated exposure control, adjustment of the mA and/or kV according to patient size and/or use of iterative reconstruction technique. COMPARISON:  None Available. FINDINGS: CT HEAD FINDINGS Brain: There is no acute intracranial hemorrhage, extra-axial fluid collection, or acute infarct Background parenchymal volume is normal for age. There is a remote infarct in the right ACA distribution with associated ex vacuo dilatation of the right lateral ventricle. The ventricles are otherwise normal in size. Incidental note is made of a cavum septum pellucidum. The pituitary and suprasellar region are normal. There is no mass lesion. There is no mass effect or midline shift. Vascular: There is calcification of the bilateral carotid siphons and vertebral arteries. Skull: Normal. Negative for fracture or focal  lesion. Sinuses/Orbits: The paranasal sinuses are clear. Bilateral lens implants are in place. The globes and orbits are otherwise unremarkable. Other: The mastoid air cells and middle ear cavities are clear. CT CERVICAL SPINE FINDINGS Alignment: Normal. Skull base and vertebrae: Skull base alignment is maintained. Vertebral body heights are preserved. There is no evidence of acute fracture. There is no suspicious osseous lesion. Soft tissues and spinal canal: No prevertebral fluid or swelling. No visible canal hematoma. Disc levels: There is overall mild disc space narrowing and degenerative endplate change. There is no evidence of high-grade spinal canal or neural foraminal stenosis. Upper chest: The imaged lung apices are clear. Other: Thyroidectomy clips are noted. IMPRESSION: 1. No acute intracranial pathology. 2. 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 3. No acute fracture or traumatic malalignment of the cervical spine. Electronically Signed   By: Lesia Hausen M.D.   On: 08/30/2023 12:32   DG Ribs Unilateral W/Chest Right  Result Date: 08/30/2023 CLINICAL DATA:  fall EXAM: RIGHT RIBS AND CHEST - 3+ VIEW COMPARISON:  08/15/2020. FINDINGS: Bilateral lung fields are clear. Bilateral costophrenic angles are clear. Normal cardio-mediastinal silhouette. No acute osseous abnormalities. No acute rib fracture or focal rib lesion. The soft tissues are within normal limits. Redemonstration of metallic staples near the left lung apex. IMPRESSION: 1. No acute cardiopulmonary disease. No displaced rib fracture. Electronically Signed   By: Jules Schick M.D.   On: 08/30/2023 12:15    Procedures .Critical Care  Performed by: Netta Corrigan, PA-C Authorized by: Netta Corrigan, PA-C   Critical care provider statement:    Critical care time (minutes):  40   Critical care time was exclusive of:  Separately billable procedures and treating other patients   Critical care was necessary to treat or prevent  imminent or life-threatening deterioration of the following conditions:  Metabolic crisis   Critical care was time spent personally by me on the following activities:  Blood draw for specimens, development of treatment plan with patient or surrogate, discussions with consultants, examination of patient, obtaining history from patient or surrogate, review of old charts, re-evaluation of patient's condition, pulse oximetry, ordering and review of radiographic studies, ordering and review of laboratory studies and ordering and performing treatments and interventions   I assumed direction of critical care for this patient from another provider in my specialty: no     Care discussed with comment:  TOC     Medications Ordered in ED Medications  bacitracin ointment (has no administration in time range)  ipratropium-albuterol (DUONEB) 0.5-2.5 (3) MG/3ML nebulizer solution 3 mL (3 mLs Nebulization Given 08/30/23 1159)    ED Course/ Medical Decision Making/ A&P                                 Medical Decision Making Amount and/or Complexity of Data Reviewed Labs: ordered. Radiology: ordered.   Tomasa Hosteller. 72 y.o. presented today for syncope. Working DDx that I considered at this time includes, but not limited to, uremic encephalopathy, renal failure, orthostatic hypotension vs vasovagal episode, arrhythmia, AS, ACS, PE, Pleural Effusion, Aortic Dissection, SAH, CVA/TIA, Vertebrobasilar Insufficiency, GIB, Trauma, OD, Sepsis, UTI, electrolyte abnormalities, anemia.  R/o DDx: orthostatic hypotension vs vasovagal episode, arrhythmia, AS, ACS, PE, Pleural Effusion, Aortic Dissection, SAH, CVA/TIA, Vertebrobasilar Insufficiency, GIB, Trauma, OD, Sepsis, UTI, electrolyte abnormalities, anemia: These are considered less likely due to history of present illness, physical exam, labs/imaging findings  Review of prior external notes: 08/19/2023 office visit  Unique Tests and My Interpretation:  CBC:  Mild leukocytosis 10.7 CMP: Urea 120, creatinine 7.26 I-STAT VBG: Acidotic 7.175 Lactic acid: Negative Troponin: 78 EKG: Rate, rhythm, axis, intervals all examined and without medically relevant abnormality. ST segments without concerns for elevations CT Head w/o Contrast: No acute changes, remote infarct noted CT cervical spine without contrast: No acute changes Right rib x-ray: Unremarkable UA: Unremarkable  Discussion with Independent Historian:  Daughter , wife  Discussion of Management of Tests:  Winnie T, TOC  Risk: Low: based on diagnostic testing/clinical impression and treatment plan  Risk Stratification Score: None  Staffed with [place], DO  Plan: On exam patient was in distress and ill-appearing.  Patient's vitals at this time are reassuring however do not have temperature yet and will give rectal time.  Patient does have multiple cuts to bilateral forearms and had scleral icterus on exam which daughter states is not new.  Patient did appear fluid overloaded on exam as well as he does have 2+ bilateral pitting edema.  Patient did have wheezing on exam in the left side and will give breathing treatment.  Patient has a history of CKD and suspect patient is having uremic encephalopathy and will obtain labs.  Patient labs came back significant for severe metabolic acidosis of 7.175 pH.  Patient's creatinine is 7.26 and a GFR of 8 however currently awaiting BUN.  Patient's TSH was also elevated however do not suspect myxedema coma as patient does not have hypotension, bradycardia, hyponatremia, hypoglycemia and does not appear to have decreased mental status on exam.  I spoke to the patient and his daughter about the possibility of dialysis however patient stated that he does not want to do dialysis as he does not want  to live that way.  Patient does see Dr. Melanee Spry for nephrology and was recently on hospice however dropped 3 months as he lived past the 6 months.  Patient states  that he wants to be discharged after labs and imaging comes back as he does not want to be hospitalized and wants to reach out to hospice again.  I spoke to the [facility] supervisor on call Worthy Rancher T. who stated that she would contact the patient about hospice.  I encouraged the patient and daughter to reach out to patient's nephrologist as well in regards to recent lab results.  When I went back in to the room daughter and wife stated that hospice is already reach out to them and that they will call them back after being discharged.  We reengaged the patient about the possibility of dialysis however patient client at this time he states is not a way of living he wants to have.  Patient does have elevated troponin as well however suspect this is more related to his CKD and do not suspect uremic pericarditis or tamponade at this time.  Do not suspect ACS.  I offered the patient admission once more for observation and patient declined.  As per patient's request he will be discharged and encouraged follow-up with his nephrologist and primary care provider and to reach back out to hospice once home.  Wounds will be cleaned and patient will be discharged.  Patient was given return precautions. Patient stable for discharge at this time.  Patient verbalized understanding of plan.  This chart was dictated using voice recognition software.  Despite best efforts to proofread,  errors can occur which can change the documentation meaning.        Final Clinical Impression(s) / ED Diagnoses Final diagnoses:  Uremic encephalopathy  Fall, initial encounter  Metabolic acidosis    Rx / DC Orders ED Discharge Orders     None         Remi Deter 08/30/23 1304    Virgina Norfolk, DO 08/30/23 1500

## 2023-08-30 NOTE — Care Management (Signed)
Transition of Care [facility]) - Emergency Department Mini Assessment   Patient Details  Name: [name]. MRN: [id number] Date of Birth: July 28, 1951  [facility] CM/SW Contact:    Lavenia Atlas, RN Phone Number: 08/30/2023, 12:34 PM   Clinical Narrative: This RNCM received call from PA John who reports patient was previously with hospice however services ended due to patient out lived life expectancy. Patient and family would like to obtain hospice services again. Per chart review patient is stage IV chronic kidney disease, declined dialysis and recently had frequent falls.  This RNCM attempted to reach patient, received voicemail. This RNCM spoke with patient's wife [name] to offer choice for home hospice. [name] reports patient would like to resume hospice services with previous vendor, however [name] does not remember which vendor provided hospice. [name] will call this RNCM. Will await call to initiate home hospice referral.    TOC following.  ED Mini Assessment: What brought you to the Emergency Department? : frequent alls recently  Barriers to Discharge: No Barriers Identified  Barrier interventions: assist with home hospice referral  Means of departure: Car  Interventions which prevented an admission or readmission: Hospice    Patient Contact and Communications Key Contact 1: Noe Gens   Spoke with: Leroy Libman Date: 08/30/23,   Contact time: 1233 Contact Phone Number: [phone] Call outcome: Mrs. Caridi will call this RNCM back  Patient states their goals for this hospitalization and ongoing recovery are:: to feel better CMS Medicare.gov Compare Post Acute Care list provided to:: Patient Represenative (must comment) (wife [name]) Choice offered to / list presented to : Spouse  Admission diagnosis:  fall - blood thinners- Cancer pt Patient Active Problem List   Diagnosis Date Noted   Dysphagia 08/19/2023   Nausea with vomiting 08/19/2023    Loss of weight 08/19/2023   Chronic diarrhea 08/19/2023   Type 2 diabetes mellitus with hypoglycemia without coma (HCC) 08/15/2020   Tobacco abuse 08/15/2020   Bronchitis due to tobacco use 08/15/2020   Prolonged QT interval 08/15/2020   OSA on CPAP 07/11/2020   Stage 4 chronic kidney disease (HCC) 07/11/2020   Rheumatoid arthritis (HCC)    Hyperlipidemia    Diabetes mellitus without complication (HCC)    OBESITY-MORBID (>100') 11/24/2008   OVERWEIGHT/OBESITY 11/24/2008   PALPITATIONS 11/24/2008   PCP:  Irena Reichmann, DO Pharmacy:   [facility] [phone] - [place], [place] - [address] [place] [address] Phone: [phone] Fax: [phone]

## 2023-08-30 NOTE — ED Notes (Addendum)
RT Note: VBG completed and handed to [employer] PA

## 2023-08-30 NOTE — ED Notes (Signed)
Pt signed that he understands he is leaving AMA and the risks that can occur leaving AMA, family as witness. Discharge instructions and follow up care with hospice reviewed and explained, pt verbalized understanding with no further questions on d/c. Bacitracin applied to wounds on arms and arms were bandaged.Pt caox4 and ambulatory on d/c.

## 2023-09-02 DIAGNOSIS — Z515 Encounter for palliative care: Secondary | ICD-10-CM | POA: Diagnosis not present

## 2023-09-02 DIAGNOSIS — C069 Malignant neoplasm of mouth, unspecified: Secondary | ICD-10-CM | POA: Diagnosis not present

## 2023-09-02 DIAGNOSIS — I10 Essential (primary) hypertension: Secondary | ICD-10-CM | POA: Diagnosis not present

## 2023-09-02 DIAGNOSIS — K219 Gastro-esophageal reflux disease without esophagitis: Secondary | ICD-10-CM | POA: Diagnosis not present

## 2023-09-02 DIAGNOSIS — I779 Disorder of arteries and arterioles, unspecified: Secondary | ICD-10-CM | POA: Diagnosis not present

## 2023-09-02 DIAGNOSIS — Z66 Do not resuscitate: Secondary | ICD-10-CM | POA: Diagnosis not present

## 2023-09-02 DIAGNOSIS — N184 Chronic kidney disease, stage 4 (severe): Secondary | ICD-10-CM | POA: Diagnosis not present

## 2023-09-02 DIAGNOSIS — I6529 Occlusion and stenosis of unspecified carotid artery: Secondary | ICD-10-CM | POA: Diagnosis not present

## 2023-09-02 DIAGNOSIS — F419 Anxiety disorder, unspecified: Secondary | ICD-10-CM | POA: Diagnosis not present

## 2023-09-02 DIAGNOSIS — Z85850 Personal history of malignant neoplasm of thyroid: Secondary | ICD-10-CM | POA: Diagnosis not present

## 2023-09-02 DIAGNOSIS — M069 Rheumatoid arthritis, unspecified: Secondary | ICD-10-CM | POA: Diagnosis not present

## 2023-09-02 DIAGNOSIS — E118 Type 2 diabetes mellitus with unspecified complications: Secondary | ICD-10-CM | POA: Diagnosis not present

## 2023-09-03 DIAGNOSIS — K219 Gastro-esophageal reflux disease without esophagitis: Secondary | ICD-10-CM | POA: Diagnosis not present

## 2023-09-03 DIAGNOSIS — M069 Rheumatoid arthritis, unspecified: Secondary | ICD-10-CM | POA: Diagnosis not present

## 2023-09-03 DIAGNOSIS — Z85850 Personal history of malignant neoplasm of thyroid: Secondary | ICD-10-CM | POA: Diagnosis not present

## 2023-09-03 DIAGNOSIS — F419 Anxiety disorder, unspecified: Secondary | ICD-10-CM | POA: Diagnosis not present

## 2023-09-03 DIAGNOSIS — E118 Type 2 diabetes mellitus with unspecified complications: Secondary | ICD-10-CM | POA: Diagnosis not present

## 2023-09-03 DIAGNOSIS — I6529 Occlusion and stenosis of unspecified carotid artery: Secondary | ICD-10-CM | POA: Diagnosis not present

## 2023-09-03 DIAGNOSIS — Z515 Encounter for palliative care: Secondary | ICD-10-CM | POA: Diagnosis not present

## 2023-09-03 DIAGNOSIS — N184 Chronic kidney disease, stage 4 (severe): Secondary | ICD-10-CM | POA: Diagnosis not present

## 2023-09-03 DIAGNOSIS — C069 Malignant neoplasm of mouth, unspecified: Secondary | ICD-10-CM | POA: Diagnosis not present

## 2023-09-03 DIAGNOSIS — Z66 Do not resuscitate: Secondary | ICD-10-CM | POA: Diagnosis not present

## 2023-09-03 DIAGNOSIS — I10 Essential (primary) hypertension: Secondary | ICD-10-CM | POA: Diagnosis not present

## 2023-09-03 DIAGNOSIS — I779 Disorder of arteries and arterioles, unspecified: Secondary | ICD-10-CM | POA: Diagnosis not present

## 2023-09-05 DIAGNOSIS — I6529 Occlusion and stenosis of unspecified carotid artery: Secondary | ICD-10-CM | POA: Diagnosis not present

## 2023-09-05 DIAGNOSIS — K219 Gastro-esophageal reflux disease without esophagitis: Secondary | ICD-10-CM | POA: Diagnosis not present

## 2023-09-05 DIAGNOSIS — I10 Essential (primary) hypertension: Secondary | ICD-10-CM | POA: Diagnosis not present

## 2023-09-05 DIAGNOSIS — N184 Chronic kidney disease, stage 4 (severe): Secondary | ICD-10-CM | POA: Diagnosis not present

## 2023-09-05 DIAGNOSIS — F419 Anxiety disorder, unspecified: Secondary | ICD-10-CM | POA: Diagnosis not present

## 2023-09-05 DIAGNOSIS — E118 Type 2 diabetes mellitus with unspecified complications: Secondary | ICD-10-CM | POA: Diagnosis not present

## 2023-09-09 DIAGNOSIS — E872 Acidosis, unspecified: Secondary | ICD-10-CM | POA: Diagnosis not present

## 2023-09-09 DIAGNOSIS — N2581 Secondary hyperparathyroidism of renal origin: Secondary | ICD-10-CM | POA: Diagnosis not present

## 2023-09-09 DIAGNOSIS — N185 Chronic kidney disease, stage 5: Secondary | ICD-10-CM | POA: Diagnosis not present

## 2023-09-09 DIAGNOSIS — R197 Diarrhea, unspecified: Secondary | ICD-10-CM | POA: Diagnosis not present

## 2023-09-09 DIAGNOSIS — E1122 Type 2 diabetes mellitus with diabetic chronic kidney disease: Secondary | ICD-10-CM | POA: Diagnosis not present

## 2023-09-09 DIAGNOSIS — D631 Anemia in chronic kidney disease: Secondary | ICD-10-CM | POA: Diagnosis not present

## 2023-09-09 DIAGNOSIS — I120 Hypertensive chronic kidney disease with stage 5 chronic kidney disease or end stage renal disease: Secondary | ICD-10-CM | POA: Diagnosis not present

## 2023-09-12 DIAGNOSIS — K219 Gastro-esophageal reflux disease without esophagitis: Secondary | ICD-10-CM | POA: Diagnosis not present

## 2023-09-12 DIAGNOSIS — N184 Chronic kidney disease, stage 4 (severe): Secondary | ICD-10-CM | POA: Diagnosis not present

## 2023-09-12 DIAGNOSIS — I6529 Occlusion and stenosis of unspecified carotid artery: Secondary | ICD-10-CM | POA: Diagnosis not present

## 2023-09-12 DIAGNOSIS — I10 Essential (primary) hypertension: Secondary | ICD-10-CM | POA: Diagnosis not present

## 2023-09-12 DIAGNOSIS — E118 Type 2 diabetes mellitus with unspecified complications: Secondary | ICD-10-CM | POA: Diagnosis not present

## 2023-09-12 DIAGNOSIS — F419 Anxiety disorder, unspecified: Secondary | ICD-10-CM | POA: Diagnosis not present

## 2023-09-18 DIAGNOSIS — F419 Anxiety disorder, unspecified: Secondary | ICD-10-CM | POA: Diagnosis not present

## 2023-09-18 DIAGNOSIS — E118 Type 2 diabetes mellitus with unspecified complications: Secondary | ICD-10-CM | POA: Diagnosis not present

## 2023-09-18 DIAGNOSIS — I10 Essential (primary) hypertension: Secondary | ICD-10-CM | POA: Diagnosis not present

## 2023-09-18 DIAGNOSIS — I6529 Occlusion and stenosis of unspecified carotid artery: Secondary | ICD-10-CM | POA: Diagnosis not present

## 2023-09-18 DIAGNOSIS — K219 Gastro-esophageal reflux disease without esophagitis: Secondary | ICD-10-CM | POA: Diagnosis not present

## 2023-09-18 DIAGNOSIS — N184 Chronic kidney disease, stage 4 (severe): Secondary | ICD-10-CM | POA: Diagnosis not present

## 2023-09-19 ENCOUNTER — Encounter (HOSPITAL_COMMUNITY)
Admission: RE | Admit: 2023-09-19 | Discharge: 2023-09-19 | Disposition: A | Discharge status: Home / Self Care | Payer: Medicare Other | Source: Ambulatory Visit | Attending: Internal Medicine | Admitting: Internal Medicine

## 2023-09-19 VITALS — BP 106/53 | HR 109 | Temp 97.0°F | Resp 18

## 2023-09-19 DIAGNOSIS — N184 Chronic kidney disease, stage 4 (severe): Secondary | ICD-10-CM | POA: Diagnosis not present

## 2023-09-19 LAB — POCT HEMOGLOBIN-HEMACUE: Hemoglobin: 7.9 g/dL — ABNORMAL LOW (ref 13.0–17.0)

## 2023-09-19 MED ORDER — EPOETIN ALFA-EPBX 10000 UNIT/ML IJ SOLN
20000.0000 [IU] | INTRAMUSCULAR | Status: DC
  Administered 2023-09-19: 20000 [IU] via SUBCUTANEOUS

## 2023-09-19 MED ORDER — EPOETIN ALFA-EPBX 10000 UNIT/ML IJ SOLN
INTRAMUSCULAR | Status: AC
  Filled 2023-09-19: qty 2

## 2023-09-19 NOTE — Progress Notes (Signed)
Notified Felecia at [facility] of HGB 7.9, no bleeding they know of. Per Doctor we can proceed [facility] current orders.

## 2023-09-25 DIAGNOSIS — I6529 Occlusion and stenosis of unspecified carotid artery: Secondary | ICD-10-CM | POA: Diagnosis not present

## 2023-09-25 DIAGNOSIS — K219 Gastro-esophageal reflux disease without esophagitis: Secondary | ICD-10-CM | POA: Diagnosis not present

## 2023-09-25 DIAGNOSIS — N184 Chronic kidney disease, stage 4 (severe): Secondary | ICD-10-CM | POA: Diagnosis not present

## 2023-09-25 DIAGNOSIS — I10 Essential (primary) hypertension: Secondary | ICD-10-CM | POA: Diagnosis not present

## 2023-09-25 DIAGNOSIS — F419 Anxiety disorder, unspecified: Secondary | ICD-10-CM | POA: Diagnosis not present

## 2023-09-25 DIAGNOSIS — E118 Type 2 diabetes mellitus with unspecified complications: Secondary | ICD-10-CM | POA: Diagnosis not present

## 2023-09-26 DIAGNOSIS — I10 Essential (primary) hypertension: Secondary | ICD-10-CM | POA: Diagnosis not present

## 2023-09-26 DIAGNOSIS — F419 Anxiety disorder, unspecified: Secondary | ICD-10-CM | POA: Diagnosis not present

## 2023-09-26 DIAGNOSIS — I6529 Occlusion and stenosis of unspecified carotid artery: Secondary | ICD-10-CM | POA: Diagnosis not present

## 2023-09-26 DIAGNOSIS — K219 Gastro-esophageal reflux disease without esophagitis: Secondary | ICD-10-CM | POA: Diagnosis not present

## 2023-09-26 DIAGNOSIS — E118 Type 2 diabetes mellitus with unspecified complications: Secondary | ICD-10-CM | POA: Diagnosis not present

## 2023-09-26 DIAGNOSIS — N184 Chronic kidney disease, stage 4 (severe): Secondary | ICD-10-CM | POA: Diagnosis not present

## 2023-09-27 DIAGNOSIS — N184 Chronic kidney disease, stage 4 (severe): Secondary | ICD-10-CM | POA: Diagnosis not present

## 2023-09-27 DIAGNOSIS — I6529 Occlusion and stenosis of unspecified carotid artery: Secondary | ICD-10-CM | POA: Diagnosis not present

## 2023-09-27 DIAGNOSIS — E118 Type 2 diabetes mellitus with unspecified complications: Secondary | ICD-10-CM | POA: Diagnosis not present

## 2023-09-27 DIAGNOSIS — K219 Gastro-esophageal reflux disease without esophagitis: Secondary | ICD-10-CM | POA: Diagnosis not present

## 2023-09-27 DIAGNOSIS — I10 Essential (primary) hypertension: Secondary | ICD-10-CM | POA: Diagnosis not present

## 2023-09-27 DIAGNOSIS — F419 Anxiety disorder, unspecified: Secondary | ICD-10-CM | POA: Diagnosis not present

## 2023-09-27 NOTE — Telephone Encounter (Signed)
Tammy, can we go ahead with the referral?

## 2023-09-28 DIAGNOSIS — F419 Anxiety disorder, unspecified: Secondary | ICD-10-CM | POA: Diagnosis not present

## 2023-09-28 DIAGNOSIS — N184 Chronic kidney disease, stage 4 (severe): Secondary | ICD-10-CM | POA: Diagnosis not present

## 2023-09-28 DIAGNOSIS — E118 Type 2 diabetes mellitus with unspecified complications: Secondary | ICD-10-CM | POA: Diagnosis not present

## 2023-09-28 DIAGNOSIS — K219 Gastro-esophageal reflux disease without esophagitis: Secondary | ICD-10-CM | POA: Diagnosis not present

## 2023-09-28 DIAGNOSIS — I6529 Occlusion and stenosis of unspecified carotid artery: Secondary | ICD-10-CM | POA: Diagnosis not present

## 2023-09-28 DIAGNOSIS — I10 Essential (primary) hypertension: Secondary | ICD-10-CM | POA: Diagnosis not present

## 2023-09-30 DIAGNOSIS — F419 Anxiety disorder, unspecified: Secondary | ICD-10-CM | POA: Diagnosis not present

## 2023-09-30 DIAGNOSIS — K219 Gastro-esophageal reflux disease without esophagitis: Secondary | ICD-10-CM | POA: Diagnosis not present

## 2023-09-30 DIAGNOSIS — N184 Chronic kidney disease, stage 4 (severe): Secondary | ICD-10-CM | POA: Diagnosis not present

## 2023-09-30 DIAGNOSIS — E118 Type 2 diabetes mellitus with unspecified complications: Secondary | ICD-10-CM | POA: Diagnosis not present

## 2023-09-30 DIAGNOSIS — I6529 Occlusion and stenosis of unspecified carotid artery: Secondary | ICD-10-CM | POA: Diagnosis not present

## 2023-09-30 DIAGNOSIS — I10 Essential (primary) hypertension: Secondary | ICD-10-CM | POA: Diagnosis not present

## 2023-09-30 NOTE — Telephone Encounter (Signed)
Referral sent 

## 2023-10-01 ENCOUNTER — Ambulatory Visit (HOSPITAL_COMMUNITY)
Admission: RE | Admit: 2023-10-01 | Discharge: 2023-10-01 | Disposition: A | Discharge status: Home / Self Care | Payer: Medicare Other | Source: Ambulatory Visit | Attending: Internal Medicine | Admitting: Internal Medicine

## 2023-10-01 VITALS — BP 110/74 | HR 112 | Temp 97.2°F | Resp 18

## 2023-10-01 DIAGNOSIS — I499 Cardiac arrhythmia, unspecified: Secondary | ICD-10-CM | POA: Diagnosis not present

## 2023-10-01 DIAGNOSIS — N184 Chronic kidney disease, stage 4 (severe): Secondary | ICD-10-CM | POA: Insufficient documentation

## 2023-10-01 DIAGNOSIS — Z743 Need for continuous supervision: Secondary | ICD-10-CM | POA: Diagnosis not present

## 2023-10-01 DIAGNOSIS — R404 Transient alteration of awareness: Secondary | ICD-10-CM | POA: Diagnosis not present

## 2023-10-01 DIAGNOSIS — R000 Tachycardia, unspecified: Secondary | ICD-10-CM | POA: Diagnosis not present

## 2023-10-01 DIAGNOSIS — I469 Cardiac arrest, cause unspecified: Secondary | ICD-10-CM | POA: Diagnosis not present

## 2023-10-01 LAB — POCT HEMOGLOBIN-HEMACUE: Hemoglobin: 7.3 g/dL — ABNORMAL LOW (ref 13.0–17.0)

## 2023-10-01 MED ORDER — EPOETIN ALFA-EPBX 40000 UNIT/ML IJ SOLN
INTRAMUSCULAR | Status: AC
  Administered 2023-10-01: 30000 [IU] via SUBCUTANEOUS
  Filled 2023-10-01: qty 1

## 2023-10-01 MED ORDER — EPOETIN ALFA-EPBX 40000 UNIT/ML IJ SOLN: 30000.0000 [IU] | Freq: Once | INTRAMUSCULAR | Status: AC

## 2023-10-01 MED ORDER — EPOETIN ALFA-EPBX 10000 UNIT/ML IJ SOLN
INTRAMUSCULAR | Status: AC
  Filled 2023-10-01: qty 2

## 2023-10-01 MED ORDER — EPOETIN ALFA-EPBX 10000 UNIT/ML IJ SOLN: 20000.0000 [IU] | INTRAMUSCULAR | Status: DC

## 2023-10-03 ENCOUNTER — Encounter (HOSPITAL_COMMUNITY): Payer: Medicare Other

## 2023-10-04 DEATH — deceased

## 2023-10-15 ENCOUNTER — Encounter (HOSPITAL_COMMUNITY): Payer: Self-pay

## 2023-10-15 ENCOUNTER — Telehealth: Payer: Self-pay | Admitting: Internal Medicine

## 2023-10-15 ENCOUNTER — Inpatient Hospital Stay (HOSPITAL_COMMUNITY): Admission: RE | Admit: 2023-10-15 | Payer: Medicare Other | Source: Ambulatory Visit

## 2023-10-15 NOTE — Telephone Encounter (Signed)
Date of death was 09/11/2023

## 2023-10-15 NOTE — Telephone Encounter (Signed)
 Chart has been flagged

## 2023-10-15 NOTE — Telephone Encounter (Signed)
Pt is deceased. 

## 2023-10-29 ENCOUNTER — Encounter (HOSPITAL_COMMUNITY): Payer: Medicare Other
